# Patient Record
Sex: Female | Born: 1935 | Race: White | Hispanic: No | Marital: Married | Smoking: Former smoker
Health system: Southern US, Community
[De-identification: ages and names within clinical notes are randomized; demographics above are authoritative.]

## PROBLEM LIST (undated history)

## (undated) DIAGNOSIS — E785 Hyperlipidemia, unspecified: Secondary | ICD-10-CM

## (undated) DIAGNOSIS — M545 Low back pain, unspecified: Secondary | ICD-10-CM

## (undated) DIAGNOSIS — R0600 Dyspnea, unspecified: Secondary | ICD-10-CM

## (undated) DIAGNOSIS — R0609 Other forms of dyspnea: Secondary | ICD-10-CM

## (undated) DIAGNOSIS — I509 Heart failure, unspecified: Secondary | ICD-10-CM

## (undated) DIAGNOSIS — Z950 Presence of cardiac pacemaker: Secondary | ICD-10-CM

## (undated) DIAGNOSIS — M542 Cervicalgia: Secondary | ICD-10-CM

## (undated) DIAGNOSIS — E669 Obesity, unspecified: Secondary | ICD-10-CM

## (undated) DIAGNOSIS — F32A Depression, unspecified: Secondary | ICD-10-CM

## (undated) DIAGNOSIS — E876 Hypokalemia: Secondary | ICD-10-CM

## (undated) DIAGNOSIS — I495 Sick sinus syndrome: Secondary | ICD-10-CM

## (undated) DIAGNOSIS — Z86718 Personal history of other venous thrombosis and embolism: Secondary | ICD-10-CM

## (undated) DIAGNOSIS — I10 Essential (primary) hypertension: Secondary | ICD-10-CM

## (undated) DIAGNOSIS — F419 Anxiety disorder, unspecified: Secondary | ICD-10-CM

## (undated) DIAGNOSIS — N200 Calculus of kidney: Secondary | ICD-10-CM

## (undated) DIAGNOSIS — E039 Hypothyroidism, unspecified: Secondary | ICD-10-CM

## (undated) DIAGNOSIS — Z973 Presence of spectacles and contact lenses: Secondary | ICD-10-CM

## (undated) DIAGNOSIS — Z972 Presence of dental prosthetic device (complete) (partial): Secondary | ICD-10-CM

## (undated) DIAGNOSIS — E119 Type 2 diabetes mellitus without complications: Secondary | ICD-10-CM

## (undated) DIAGNOSIS — J449 Chronic obstructive pulmonary disease, unspecified: Secondary | ICD-10-CM

## (undated) DIAGNOSIS — K219 Gastro-esophageal reflux disease without esophagitis: Secondary | ICD-10-CM

## (undated) DIAGNOSIS — G4733 Obstructive sleep apnea (adult) (pediatric): Secondary | ICD-10-CM

## (undated) DIAGNOSIS — F329 Major depressive disorder, single episode, unspecified: Secondary | ICD-10-CM

## (undated) DIAGNOSIS — Z8739 Personal history of other diseases of the musculoskeletal system and connective tissue: Secondary | ICD-10-CM

## (undated) DIAGNOSIS — J189 Pneumonia, unspecified organism: Secondary | ICD-10-CM

## (undated) DIAGNOSIS — K5732 Diverticulitis of large intestine without perforation or abscess without bleeding: Secondary | ICD-10-CM

## (undated) DIAGNOSIS — G8929 Other chronic pain: Secondary | ICD-10-CM

## (undated) DIAGNOSIS — M1990 Unspecified osteoarthritis, unspecified site: Secondary | ICD-10-CM

## (undated) DIAGNOSIS — J90 Pleural effusion, not elsewhere classified: Secondary | ICD-10-CM

## (undated) DIAGNOSIS — I480 Paroxysmal atrial fibrillation: Secondary | ICD-10-CM

## (undated) DIAGNOSIS — C50911 Malignant neoplasm of unspecified site of right female breast: Secondary | ICD-10-CM

## (undated) DIAGNOSIS — H269 Unspecified cataract: Secondary | ICD-10-CM

## (undated) DIAGNOSIS — K08109 Complete loss of teeth, unspecified cause, unspecified class: Secondary | ICD-10-CM

## (undated) HISTORY — DX: Other forms of dyspnea: R06.09

## (undated) HISTORY — DX: Obstructive sleep apnea (adult) (pediatric): G47.33

## (undated) HISTORY — PX: VAGINAL HYSTERECTOMY: SUR661

## (undated) HISTORY — DX: Heart failure, unspecified: I50.9

## (undated) HISTORY — DX: Dyspnea, unspecified: R06.00

## (undated) HISTORY — DX: Essential (primary) hypertension: I10

## (undated) HISTORY — DX: Hyperlipidemia, unspecified: E78.5

## (undated) HISTORY — PX: POSTERIOR LAMINECTOMY / DECOMPRESSION LUMBAR SPINE: SUR740

## (undated) HISTORY — DX: Diverticulitis of large intestine without perforation or abscess without bleeding: K57.32

## (undated) HISTORY — PX: COLONOSCOPY: SHX174

## (undated) HISTORY — PX: BACK SURGERY: SHX140

## (undated) HISTORY — PX: DILATION AND CURETTAGE OF UTERUS: SHX78

## (undated) HISTORY — PX: LAPAROSCOPIC CHOLECYSTECTOMY: SUR755

## (undated) HISTORY — DX: Chronic obstructive pulmonary disease, unspecified: J44.9

## (undated) HISTORY — DX: Obesity, unspecified: E66.9

## (undated) HISTORY — PX: BREAST BIOPSY: SHX20

## (undated) HISTORY — DX: Anxiety disorder, unspecified: F41.9

## (undated) HISTORY — PX: ESOPHAGOGASTRODUODENOSCOPY: SHX1529

## (undated) HISTORY — DX: Paroxysmal atrial fibrillation: I48.0

---

## 1990-12-10 HISTORY — PX: LAMINOTOMY / EXCISION DISK POSTERIOR CERVICAL SPINE: SUR749

## 2003-12-11 HISTORY — PX: CARDIAC CATHETERIZATION: SHX172

## 2004-02-21 ENCOUNTER — Inpatient Hospital Stay (HOSPITAL_COMMUNITY): Admission: AD | Admit: 2004-02-21 | Discharge: 2004-02-23 | Payer: Self-pay | Admitting: Cardiology

## 2005-04-11 ENCOUNTER — Ambulatory Visit (HOSPITAL_BASED_OUTPATIENT_CLINIC_OR_DEPARTMENT_OTHER): Admission: RE | Admit: 2005-04-11 | Discharge: 2005-04-11 | Payer: Self-pay | Admitting: *Deleted

## 2005-04-15 ENCOUNTER — Ambulatory Visit: Payer: Self-pay | Admitting: Internal Medicine

## 2005-06-18 ENCOUNTER — Other Ambulatory Visit: Admission: RE | Admit: 2005-06-18 | Discharge: 2005-06-18 | Payer: Self-pay | Admitting: Obstetrics and Gynecology

## 2005-10-01 ENCOUNTER — Ambulatory Visit: Payer: Self-pay | Admitting: Internal Medicine

## 2005-10-10 ENCOUNTER — Ambulatory Visit (HOSPITAL_COMMUNITY): Admission: RE | Admit: 2005-10-10 | Discharge: 2005-10-10 | Payer: Self-pay | Admitting: Internal Medicine

## 2005-10-10 ENCOUNTER — Ambulatory Visit: Payer: Self-pay | Admitting: Internal Medicine

## 2005-10-10 ENCOUNTER — Ambulatory Visit: Admission: RE | Admit: 2005-10-10 | Discharge: 2005-10-10 | Payer: Self-pay | Admitting: Internal Medicine

## 2005-11-15 ENCOUNTER — Ambulatory Visit: Payer: Self-pay | Admitting: Internal Medicine

## 2005-12-10 HISTORY — PX: INSERT / REPLACE / REMOVE PACEMAKER: SUR710

## 2006-04-01 ENCOUNTER — Ambulatory Visit: Payer: Self-pay | Admitting: Internal Medicine

## 2006-08-07 ENCOUNTER — Ambulatory Visit: Payer: Self-pay | Admitting: Cardiology

## 2006-08-07 ENCOUNTER — Inpatient Hospital Stay (HOSPITAL_COMMUNITY): Admission: AD | Admit: 2006-08-07 | Discharge: 2006-08-09 | Payer: Self-pay | Admitting: Internal Medicine

## 2006-08-26 ENCOUNTER — Ambulatory Visit: Payer: Self-pay

## 2006-11-26 ENCOUNTER — Ambulatory Visit: Payer: Self-pay | Admitting: Internal Medicine

## 2007-04-17 ENCOUNTER — Ambulatory Visit: Payer: Self-pay | Admitting: Internal Medicine

## 2007-05-07 ENCOUNTER — Ambulatory Visit (HOSPITAL_COMMUNITY): Admission: RE | Admit: 2007-05-07 | Discharge: 2007-05-08 | Payer: Self-pay | Admitting: Otolaryngology

## 2007-07-14 ENCOUNTER — Ambulatory Visit: Payer: Self-pay

## 2007-10-27 ENCOUNTER — Ambulatory Visit: Payer: Self-pay | Admitting: Internal Medicine

## 2008-01-21 ENCOUNTER — Ambulatory Visit: Payer: Self-pay | Admitting: Internal Medicine

## 2008-04-21 ENCOUNTER — Ambulatory Visit: Payer: Self-pay | Admitting: Internal Medicine

## 2008-07-12 ENCOUNTER — Ambulatory Visit: Payer: Self-pay | Admitting: Internal Medicine

## 2008-10-13 ENCOUNTER — Ambulatory Visit: Payer: Self-pay | Admitting: Internal Medicine

## 2009-01-12 ENCOUNTER — Ambulatory Visit: Payer: Self-pay | Admitting: Internal Medicine

## 2009-03-18 ENCOUNTER — Encounter: Payer: Self-pay | Admitting: Internal Medicine

## 2009-04-13 ENCOUNTER — Ambulatory Visit: Payer: Self-pay | Admitting: Internal Medicine

## 2009-05-06 ENCOUNTER — Encounter: Payer: Self-pay | Admitting: Internal Medicine

## 2009-05-12 ENCOUNTER — Encounter (INDEPENDENT_AMBULATORY_CARE_PROVIDER_SITE_OTHER): Payer: Self-pay | Admitting: *Deleted

## 2009-08-13 DIAGNOSIS — R0609 Other forms of dyspnea: Secondary | ICD-10-CM

## 2009-08-13 DIAGNOSIS — G4733 Obstructive sleep apnea (adult) (pediatric): Secondary | ICD-10-CM | POA: Insufficient documentation

## 2009-08-13 DIAGNOSIS — J449 Chronic obstructive pulmonary disease, unspecified: Secondary | ICD-10-CM | POA: Insufficient documentation

## 2009-08-13 DIAGNOSIS — F411 Generalized anxiety disorder: Secondary | ICD-10-CM | POA: Insufficient documentation

## 2009-08-13 DIAGNOSIS — E669 Obesity, unspecified: Secondary | ICD-10-CM | POA: Insufficient documentation

## 2009-08-13 DIAGNOSIS — E1129 Type 2 diabetes mellitus with other diabetic kidney complication: Secondary | ICD-10-CM | POA: Insufficient documentation

## 2009-08-13 DIAGNOSIS — I10 Essential (primary) hypertension: Secondary | ICD-10-CM | POA: Insufficient documentation

## 2009-08-13 DIAGNOSIS — K5730 Diverticulosis of large intestine without perforation or abscess without bleeding: Secondary | ICD-10-CM | POA: Insufficient documentation

## 2009-08-13 DIAGNOSIS — E785 Hyperlipidemia, unspecified: Secondary | ICD-10-CM | POA: Insufficient documentation

## 2009-08-13 DIAGNOSIS — R0989 Other specified symptoms and signs involving the circulatory and respiratory systems: Secondary | ICD-10-CM

## 2009-08-13 DIAGNOSIS — I509 Heart failure, unspecified: Secondary | ICD-10-CM | POA: Insufficient documentation

## 2009-08-13 DIAGNOSIS — I4891 Unspecified atrial fibrillation: Secondary | ICD-10-CM | POA: Insufficient documentation

## 2009-08-23 ENCOUNTER — Ambulatory Visit: Payer: Self-pay | Admitting: Internal Medicine

## 2009-08-23 DIAGNOSIS — Z950 Presence of cardiac pacemaker: Secondary | ICD-10-CM

## 2009-11-24 ENCOUNTER — Encounter: Payer: Self-pay | Admitting: Internal Medicine

## 2009-11-29 ENCOUNTER — Encounter: Payer: Self-pay | Admitting: Internal Medicine

## 2009-11-29 ENCOUNTER — Ambulatory Visit: Payer: Self-pay | Admitting: Internal Medicine

## 2009-12-13 ENCOUNTER — Encounter: Payer: Self-pay | Admitting: Internal Medicine

## 2010-03-01 ENCOUNTER — Ambulatory Visit: Payer: Self-pay | Admitting: Internal Medicine

## 2010-03-15 ENCOUNTER — Encounter: Payer: Self-pay | Admitting: Internal Medicine

## 2010-06-07 ENCOUNTER — Ambulatory Visit: Payer: Self-pay | Admitting: Internal Medicine

## 2010-06-20 ENCOUNTER — Encounter: Payer: Self-pay | Admitting: Internal Medicine

## 2010-09-05 ENCOUNTER — Ambulatory Visit: Payer: Self-pay | Admitting: Internal Medicine

## 2010-12-07 ENCOUNTER — Ambulatory Visit: Payer: Self-pay | Admitting: Internal Medicine

## 2010-12-20 ENCOUNTER — Encounter (INDEPENDENT_AMBULATORY_CARE_PROVIDER_SITE_OTHER): Payer: Self-pay | Admitting: *Deleted

## 2011-01-11 NOTE — Cardiovascular Report (Signed)
Summary: Office Visit Remote   Office Visit Remote   Imported By: Roderic Ovens 12/15/2009 12:47:21  _____________________________________________________________________  External Attachment:    Type:   Image     Comment:   External Document

## 2011-01-11 NOTE — Assessment & Plan Note (Signed)
Summary: medtronic/saf   Visit Type:  Follow-up Primary Traci [name]:  Traci [name]   History of Present Illness: Ms. Traci [name] returns today for PPM followup.  She is a pleasant 77 yo woman with a h/o bradycardia, PAF now controlled with amiodarone. She is s/p PPM and doing well.  No syncope.  No c/p, sob, fever, or chills.  No other complaints.  Current Medications (verified): 1)  Nexium 40 Mg Cpdr (Esomeprazole Magnesium) .Marland Kitchen.. 1 By Mouth Once Daily 2)  Potassium Chloride Crys Cr 20 Meq Cr-Tabs (Potassium Chloride Crys Cr) .... 2 in Am 1 in Pm 3)  Furosemide 40 Mg Tabs (Furosemide) .... Take One Tablet By Mouth Daily. 4)  Diltiazem Hcl Er Beads 180 Mg Xr24h-Cap (Diltiazem Hcl Er Beads) .... Take One Capsule By Mouth Daily 5)  Amiodarone Hcl 200 Mg Tabs (Amiodarone Hcl) .... Take One Tablet By Mouth Daily 6)  Metoprolol Succinate 100 Mg Xr24h-Tab (Metoprolol Succinate) .... Take One Tablet By Mouth Daily 7)  Warfarin Sodium 5 Mg Tabs (Warfarin Sodium) .... Use As Directed By Anticoagulation Clinic 8)  Zaroxolyn 2.5 Mg Tabs (Metolazone) .Marland Kitchen.. 1 By Mouth Once Daily 9)  Zyrtec Hives Relief 10 Mg Tabs (Cetirizine Hcl) .Marland Kitchen.. 1 By Mouth Once Daily 10)  Doxazosin Mesylate 2 Mg Tabs (Doxazosin Mesylate) .Marland Kitchen.. 1 By Mouth Once Daily 11)  Ra Biotin 2500 Mcg Caps (Biotin) .... Uad  Allergies: 1)  ! Codeine 2)  ! Penicillin 3)  ! Sulfa 4)  ! * Shellfish  Past History:  Past Medical History: Last updated: 08/13/2009 DYSPNEA ON EXERTION (ICD-786.09) COPD (ICD-496) COUMADIN THERAPY (ICD-V58.61) ANXIETY (ICD-300.00) CHF (ICD-428.0) DIVERTICULOSIS OF COLON (ICD-562.10) SLEEP APNEA, OBSTRUCTIVE (ICD-327.23) PAROXYSMAL ATRIAL FIBRILLATION (ICD-427.31) HYPERTENSION (ICD-401.9) OBESITY (ICD-278.00) HYPERLIPIDEMIA (ICD-272.4) DM (ICD-250.00)  Past Surgical History: Last updated: 08/13/2009  1. Status post cardiac catheterization.  2. Hysterectomy.  3. C-spine surgery.  4. Lower back surgery.  5.  EGD.  Vital Signs:  Patient profile:   77 year old female Height:      65.75 inches Weight:      260 pounds BMI:     42.44 Pulse rate:   69 / minute BP sitting:   100 / 54  (left arm)  Vitals Entered By: Laurance Flatten CMA (September 05, 2010 3:57 PM)  Physical Exam  General:  Well developed, well nourished, in no acute distress. Head:  normocephalic and atraumatic Eyes:  PERRLA/EOM intact; conjunctiva and lids normal. Neck:  Neck supple, no JVD. No masses, thyromegaly or abnormal cervical nodes. Chest Wall:  Well healed PPM incision. Lungs:  Clear bilaterally to auscultation with no wheezes, rales, or rhonchi. Heart:  RRR with normal S1 and S2.  PMI is not enlarged or laterally displaced. Abdomen:  Bowel sounds positive; abdomen soft and non-tender without masses, organomegaly, or hernias noted. No hepatosplenomegaly. Msk:  Back normal, normal gait. Muscle strength and tone normal. Pulses:  pulses normal in all 4 extremities Extremities:  No clubbing or cyanosis. Neurologic:  Alert and oriented x 3.   PPM Specifications Following MD:  Lewayne Bunting, MD     PPM Vendor:  Medtronic     PPM Model Number:  ADDR01     PPM Serial Number:  [id number] PPM DOI:  08/08/2006     PPM Implanting MD:  Lewayne Bunting, MD  Lead 1    Location: RA     DOI: 08/08/2006     Model #: 4098     Serial #: [id number]     Status:  active Lead 2    Location: RV     DOI: 08/08/2006     Model #: 1610     Serial #: [id number]     Status: active   Indications:  BRADYCARDIA    PPM Follow Up Battery Voltage:  2.78 V     Battery Est. Longevity:  6.5 yrs     Pacer Dependent:  No       PPM Device Measurements Atrium  Amplitude: 2.80 mV, Impedance: 565 ohms, Threshold: 0.750 V at 0.40 msec Right Ventricle  Amplitude: 15.68 mV, Impedance: 700 ohms, Threshold: 1.00 V at 0.40 msec  Episodes MS Episodes:  185     Percent Mode Switch:  0.4%     Coumadin:  Yes Ventricular High Rate:  0     Atrial Pacing:  99.6%      Ventricular Pacing:  0.2%  Parameters Mode:  MVP     Lower Rate Limit:  60     Upper Rate Limit:  130 Paced AV Delay:  250     Sensed AV Delay:  250 Next Remote Date:  12/07/2010     Next Cardiology Appt Due:  08/13/2011 Tech Comments:  185 MODE SWITCHES + COUMADIN.  NORMAL DEVICE FUNCTION.  CHANGED RA OUTPUT FROM 1.5 TO 2.0 V AND RV OUTPUT FROM 2.0 TO 2.5 V.  [employer] TRANSMISSION 12-07-10. ROV IN 12 MTHS W/GT. Traci [name]  September 05, 2010 4:11 PM MD Comments:  Agree with above.  Impression & Recommendations:  Problem # 1:  CARDIAC PACEMAKER IN SITU (ICD-V45.01) Her device is working normally.  Will recheck in several months.  Problem # 2:  PAROXYSMAL ATRIAL FIBRILLATION (ICD-427.31) She is in NSR over 99% of the time.  She will continue meds as below. Her updated medication list for this problem includes:    Amiodarone Hcl 200 Mg Tabs (Amiodarone hcl) .Marland Kitchen... Take one tablet by mouth daily    Metoprolol Succinate 100 Mg Xr24h-tab (Metoprolol succinate) .Marland Kitchen... Take one tablet by mouth daily    Warfarin Sodium 5 Mg Tabs (Warfarin sodium) ..... Use as directed by anticoagulation clinic  Problem # 3:  HYPERTENSION (ICD-401.9) Her blood pressure is well controlled.  Continue meds as below. Her updated medication list for this problem includes:    Furosemide 40 Mg Tabs (Furosemide) .Marland Kitchen... Take one tablet by mouth daily.    Diltiazem Hcl Er Beads 180 Mg Xr24h-cap (Diltiazem hcl er beads) .Marland Kitchen... Take one capsule by mouth daily    Metoprolol Succinate 100 Mg Xr24h-tab (Metoprolol succinate) .Marland Kitchen... Take one tablet by mouth daily    Zaroxolyn 2.5 Mg Tabs (Metolazone) .Marland Kitchen... 1 by mouth once daily    Doxazosin Mesylate 2 Mg Tabs (Doxazosin mesylate) .Marland Kitchen... 1 by mouth once daily  Patient Instructions: 1)  Your physician wants you to follow-up in:12 months with Dr Court Joy will receive a reminder letter in the mail two months in advance. If you don't receive a letter, please call our office to  schedule the follow-up appointment. 2)  Try and have your weight at or below 250lbs

## 2011-01-11 NOTE — Letter (Signed)
Summary: Remote Device Check  [facility], Main Office  [address]   [place], [place] [address]   Phone: [phone]  Fax: [phone]     December 13, 2009 MRN: [id number]   [facility] [address] [place], [place]  [address]   Dear Ms. KOLINSKI,   Your remote transmission was recieved and reviewed by your physician.  All diagnostics were within normal limits for you.  __X___Your next transmission is scheduled for:    March 01, 2010.  Please transmit at any time this day.  If you have a wireless device your transmission will be sent automatically.     Sincerely,  Proofreader

## 2011-01-11 NOTE — Letter (Signed)
Summary: Remote Device Check  [facility], Main Office  [address]   [place], [place] [address]   Phone: [phone]  Fax: [phone]     June 20, 2010 MRN: [id number]   [facility] [address] [place], [place]  [address]   Dear Ms. RATHBUN,   Your remote transmission was recieved and reviewed by your physician.  All diagnostics were within normal limits for you.   __X____Your next office visit is scheduled for:    September 2011 with Dr Ladona Ridgel.                          Please call our office to schedule an appointment.    Sincerely,  Vella Kohler

## 2011-01-11 NOTE — Letter (Signed)
Summary: Remote Device Check  [facility], Main Office  [address]   [place], [place] [address]   Phone: [phone]  Fax: [phone]     December 20, 2010 MRN: [id number]   [facility] [address] [place], [place]  [address]   Dear Ms. WITHEM,   Your remote transmission was recieved and reviewed by your physician.  All diagnostics were within normal limits for you.  __X___Your next transmission is scheduled for:  03-08-2011.  Please transmit at any time this day.  If you have a wireless device your transmission will be sent automatically.   Sincerely,  Vella Kohler

## 2011-01-11 NOTE — Cardiovascular Report (Signed)
Summary: Office Visit Remote   Office Visit Remote   Imported By: Roderic Ovens 06/21/2010 12:08:06  _____________________________________________________________________  External Attachment:    Type:   Image     Comment:   External Document

## 2011-01-11 NOTE — Cardiovascular Report (Signed)
Summary: Office Visit   Office Visit   Imported By: Roderic Ovens 09/06/2010 15:46:37  _____________________________________________________________________  External Attachment:    Type:   Image     Comment:   External Document

## 2011-01-11 NOTE — Letter (Signed)
Summary: Remote Device Check  [facility], Main Office  [address]   [place], [place] [address]   Phone: [phone]  Fax: [phone]     March 15, 2010 MRN: [id number]   [facility] [address] [place], [place]  [address]   Dear Ms. FAIRES,   Your remote transmission was recieved and reviewed by your physician.  All diagnostics were within normal limits for you.  __X___Your next transmission is scheduled for:  June 07, 2010.  Please transmit at any time this day.  If you have a wireless device your transmission will be sent automatically.     Sincerely,  Proofreader

## 2011-01-12 NOTE — Cardiovascular Report (Signed)
Summary: Office Visit Remote   Office Visit Remote   Imported By: Roderic Ovens 03/17/2010 14:59:59  _____________________________________________________________________  External Attachment:    Type:   Image     Comment:   External Document

## 2011-01-17 ENCOUNTER — Other Ambulatory Visit: Payer: Self-pay

## 2011-01-17 DIAGNOSIS — C50919 Malignant neoplasm of unspecified site of unspecified female breast: Secondary | ICD-10-CM

## 2011-01-25 ENCOUNTER — Encounter (HOSPITAL_COMMUNITY): Payer: Self-pay

## 2011-01-25 ENCOUNTER — Other Ambulatory Visit (HOSPITAL_COMMUNITY): Payer: Self-pay

## 2011-01-25 ENCOUNTER — Ambulatory Visit (HOSPITAL_COMMUNITY): Payer: Self-pay

## 2011-03-08 ENCOUNTER — Ambulatory Visit (INDEPENDENT_AMBULATORY_CARE_PROVIDER_SITE_OTHER): Payer: Medicare Other | Admitting: *Deleted

## 2011-03-08 DIAGNOSIS — I4891 Unspecified atrial fibrillation: Secondary | ICD-10-CM

## 2011-03-08 NOTE — Progress Notes (Signed)
Remote pacer check  

## 2011-04-10 ENCOUNTER — Encounter: Payer: Self-pay | Admitting: *Deleted

## 2011-04-24 NOTE — Op Note (Signed)
NAME:  Traci [name], Traci [name]                 ACCOUNT NO.:  [id number]   MEDICAL RECORD NO.:  [id number]          PATIENT TYPE:  OIB   LOCATION:  5729                         FACILITY:  [facility]   PHYSICIAN:  Jefry H. Pollyann Kennedy, MD     DATE OF BIRTH:  1932-01-30   DATE OF PROCEDURE:  05/07/2007  DATE OF DISCHARGE:                               OPERATIVE REPORT   PREOPERATIVE DIAGNOSES:  Obstructive sleep apnea syndrome; nasal septal  deviation, traumatic; and inferior turbinate hypertrophy.   POSTOPERATIVE DIAGNOSES:  Obstructive sleep apnea syndrome; nasal septal  deviation, traumatic; and inferior turbinate hypertrophy.   PROCEDURE PERFORMED:  1. Nasal septoplasty.  2. Submucous resection, inferior turbinates.   SURGEON:  Jefry H. Pollyann Kennedy, MD.   ANESTHESIA:  General endotracheal anesthesia was used.   COMPLICATIONS:  No complications.   ESTIMATED BLOOD LOSS:  Minimal.   FINDINGS:  Severe deviation of the septum with a deep extensive caudal  deflection to the right anteriorly and a leftward deflection  posteriorly.  Inferior turbinates were very large with thick bone.   HISTORY:  This is a 77 year old lady who fell several months ago and  broke her nose, did not seek medical treatment at the time, has had  difficulty using her CPAP and breathing through her nose ever since.  The risks, benefits, alternatives and complications of the procedure  were explained to the patient who seemed to understand and agreed to  surgery.   DESCRIPTION OF PROCEDURE:  The patient was taken to the operating room  and placed on the operating table in the supine position.  Following the  induction of general endotracheal anesthesia, the patient was prepped  and draped in the standard fashion.  Afrin spray was used preoperatively  in the nose.  1% Xylocaine with epinephrine was infiltrated into the  septum, the columella and inferior turbinates bilaterally.  Afrin-soaked  pledgets were placed bilaterally.   1.  NASAL SEPTOPLASTY:  A left hemitransfixion incision was used to      approach the cartilage and a mucosal flap was developed posteriorly      to the sphenoid rostrum.  The bony cartilaginous junction was      divided and a flap was developed down the right side.  There was a      vertical fracture through the posterior quadrangular cartilage that      was identified with complete separation of the cartilaginous      fragments.  The mucosa was kept intact despite this.  The ethmoid      plate was very thickened and S-shaped with curvature to the left.      A resection of the ethmoid plate was then accomplished using Jolyne Loa.  The quadrangular cartilage was dissected out      of the maxillary crest and the columellar pocket.  2-3 mm of      inferior cartilage was resected with a swivel knife.  The fracture      segment posteriorly was removed as well.  A harvested piece of  cartilage was then cut to size and shape and used as an extra shim      inferiorly to help support the residual nasal dorsum of the      residual caudal strut.  This was secured in place using 4-0 undyed      nylon suture.  The septal incision was reapproximated with chromic      suture.  The septal flaps were quilted with plain gut.   1. SUBMUCOUS RESECTION OF INFERIOR TURBINATES BILATERALLY:  The      leading edge of the inferior turbinates was incised in a vertical      fashion with a #15 scalpel.  A caudal elevator was used to elevate      mucosa off bone in all directions, and large fragments of bone were      resected.  Turbinate remnants were then outfractured with the      caudal elevator.  The nasal cavities were suctioned of blood and      secretions, packed with rolled up Telfa coated with bacitracin.      The pharynx was then suctioned as well.  The patient was then      awakened, extubated and transferred to recovery in stable      condition.      Jefry H. Pollyann Kennedy, MD   Electronically Signed     JHR/MEDQ  D:  05/07/2007  T:  05/08/2007  Job:  [id number]   cc:   Tommas Olp, M.D.

## 2011-04-24 NOTE — Assessment & Plan Note (Signed)
[facility]                         ELECTROPHYSIOLOGY OFFICE NOTE   NAME:[name], Traci [name]                        MRN:          [id number]  DATE:07/14/2007                            DOB:          12/20/1931    Traci [name] was seen today in the clinic on July 14, 2007, for follow-  up of her Medtronic model number [id number], date of implant was August 08, 2006, for bradycardia.   On interrogation of her device today, her battery voltage is 2.78, P-  waves measured 1.4 to 2 mV with an atrial capture threshold of 0.75  volts at 0.4 msec and an atrial lead impedance of 532 ohms.  R-waves  measured 11.2 to 15.68 mV with a ventricular capture threshold of 0.75  volts at 0.4 msec and a ventricular lead impedance of 706 ohms.  There  were no episodes noted.  No mode switches.  She is on Coumadin therapy.  She V paces about 0.2% of the time.  No changes were made in her  parameters.  Her capture adaptive both the A and D are on and she will  send [facility] transmissions with a return office visit in one year.      Altha Harm, LPN  Electronically Signed      Doylene Canning. Ladona Ridgel, MD  Electronically Signed   PO/MedQ  DD: 07/14/2007  DT: 07/14/2007  Job #: [id number]

## 2011-04-24 NOTE — Assessment & Plan Note (Signed)
[facility]                         ELECTROPHYSIOLOGY OFFICE NOTE   NAME:COOPERChong, January                        MRN:          [id number]  DATE:04/17/2007                            DOB:          January 26, 1932    Ms. Bezold returns today for followup and evaluation for preoperative  clearance for sinus surgery.  The patient is a very pleasant 77 year old  woman with a history of obesity, paroxysmal atrial fibrillation, chronic  Coumadin therapy, with hypertension, diabetes, and sleep apnea.  She  fractured her nose back in October after falling on a treadmill and is  having trouble with nasal obstruction, is now scheduled for nasal  surgery for a deviated nasal septum.  She denies much in the way of  shortness of breath.  She has gone back to exercising and is now working  on [employer].  She denies chest pain, she denies shortness of  breath.  She is fairly sedentary but does get around on a regular basis.   PHYSICAL EXAMINATION:  GENERAL:  Notable for her being a pleasant, obese  woman in no distress.  VITAL SIGNS:  Blood pressure 122/62, the pulse 68 and regular, the  respirations were 18, and the weight was 253 pounds.  NECK:  Revealed no jugular venous distention.  LUNGS:  Clear bilaterally to auscultation.  There were no wheezes,  rales, or rhonchi.  CARDIOVASCULAR:  Distant with a regular rate and rhythm and normal S1  and S2.  EXTREMITIES:  Demonstrated trace peripheral edema.   MEDICATIONS:  Include diltiazem, Coumadin, Pacerone, furosemide,  Flonase, potassium, metoprolol, Nexium, metolazone, Zyrtec, Cardura.  The EKG today demonstrates atrial pacing with appropriate ventricular  sensing.   IMPRESSION:  1. Symptomatic bradycardia.  2. Paroxysmal atrial fibrillation.  3. Congestive heart failure secondary to diastolic dysfunction, now      well controlled.   DISCUSSION:  Overall Ms. Gulas is stable.  I think she will be low risk  from major cardiovascular complications from sinus surgery.  I would  recommend that she stop her Coumadin 4 days prior to the surgery and  start it back on the day of surgery after her surgical procedure has  been carried out.  I do not think Lovenox overlap will be required for  this patient.  I will plan to see her back on an as-needed basis and per  our usual followup.     Doylene Canning. Ladona Ridgel, MD  Electronically Signed    GWT/MedQ  DD: 04/17/2007  DT: 04/17/2007  Job #: [id number]   cc:   Jeannett Senior. Pollyann Kennedy, MD

## 2011-04-27 NOTE — Discharge Summary (Signed)
NAME:  Traci [name], BRINSON NO.:  [id number]   MEDICAL RECORD NO.:  [id number]                   PATIENT TYPE:  INP   LOCATION:  2015                                 FACILITY:  [facility]   PHYSICIAN:  Arturo Morton. Riley Kill, M.D. [facility]         DATE OF BIRTH:  12/06/1932   DATE OF ADMISSION:  02/21/2004  DATE OF DISCHARGE:  02/23/2004                           DISCHARGE SUMMARY - REFERRING   HOSPITAL COURSE:  Ms. Niziolek is a 77 year old female who was transferred  from [facility] after experiencing indigestion type symptoms that  awakened her around 2 a.m. on Sunday morning. EMS was called, and she was  given sublingual nitroglycerin that helped this pain. She was also found to  be in atrial fibrillation with rapid ventricular response. She is on  Coumadin for PAF. She was then taken to [facility] and given 5 mg of  IV Lopressor and returned to normal sinus rhythm. She does recall  indigestion symptoms in the past that was partially relieved by Tums, but  she felt the pain was different.   An echocardiogram in the past revealed LVH as well as diastolic dysfunction.  She has recently had stress with the death of her mother in law. She was  transferred here for cardiac catheterization.   PAST MEDICAL HISTORY:  Significant for PAF, long term Coumadin use, LVH,  diabetes mellitus diet controlled, allergies, hypertension, status post  hysterectomy secondary to cervical cancer, history of back surgery-lumbar  and cervical.   SOCIAL HISTORY:  Lives in [place] with her husband. She is married. She  has two daughters. Quit smoking five years ago but maintains a diabetic  diet. No alcohol or illicit drug use.   ALLERGIES:  PENICILLIN, SULFA, CODEINE, SHELLFISH.   MEDICATIONS PRIOR TO ADMISSION:  1. Cardizem LA 240 mg a day.  2. Uroxatral 10 mg a day.  3. Coumadin 5 mg a day which is being held.  4. Hydrochlorothiazide 25 mg a day.  5. Rhinocort as  needed.   HOSPITAL COURSE:  The patient was brought into [facility] and the following  day underwent cardiac catheterization that revealed essentially  angiographically normal coronary arteries. EF was greater than 60%. At this  time, it was felt that her symptoms were secondary to her atrial  fibrillation. We will go ahead and resume Coumadin and continue to monitor  her rhythm. Overnight, she was monitored and was felt to be in stable  condition for discharge on February 23, 2004.   DISCHARGE INSTRUCTIONS:  Her medications are as prior to her admission  medications with the addition of Lopressor 25 mg p.o. b.i.d. No strenuous  activity or driving for two days and gradually increase activity. Remain on  a low fat, diabetic diet. Clean over catheterization site with soap and  water. No scrubbing. She is to call for any questions or concerns. To follow  with DR. Wallace Cullens in two  weeks.      Guy Franco, P.A. [facility]                      Thomas D. Riley Kill, M.D. [facility]    LB/MEDQ  D:  02/23/2004  T:  02/24/2004  Job:  [id number]   cc:   Wallace Cullens  [address] [place]  [place]  [place] [address]  Fax: [phone]

## 2011-04-27 NOTE — Cardiovascular Report (Signed)
NAME:  Traci [name], POZNANSKI NO.:  [id number]   MEDICAL RECORD NO.:  [id number]                   PATIENT TYPE:  INP   LOCATION:  2015                                 FACILITY:  [facility]   PHYSICIAN:  Arturo Morton. Riley Kill, M.D. [facility]         DATE OF BIRTH:  02/11/1932   DATE OF PROCEDURE:  02/22/2004  DATE OF DISCHARGE:                              CARDIAC CATHETERIZATION   PROCEDURES PERFORMED:  1. Left heart catheterization.  2. Selective coronary arteriography.  3. Selective left ventriculography.   CARDIOLOGIST:  Arturo Morton. Riley Kill, M.D.   INDICATIONS:  Ms. Keough is a delightful 77 year old woman without has been  under a lot of stress.  She recently awoke with the onset of chest pain.  She was brought to the emergency room where she was noted to be in rapid  atrial fibrillation.  She subsequently converted.  She was transferred  subsequently for cardiac catheterization and further evaluation.  Her  Coumadin was stopped and her INR was down to 1.6 earlier today.  She was  brought to the lab for further evaluation.   DESCRIPTION OF THE PROCEDURE:  The patient was brought to the cath lab, and  prepped and draped in the usual fashion.  Through an anterior puncture the  right femoral artery was easily entered.  A 6 French sheath was used. Views  of the left and right coronary arteries were obtained in multiple  angiographic projections.  Ventriculography was performed in the RAO  projection.   The patient tolerated the procedure well and there were no complications.  All catheters were subsequently removed and she was taken to the holding  area in satisfactory clinical condition.   HEMODYNAMIC DATA:  1. Central aortic pressure 153/72, mean 105.  2. Left ventricle 166/22.  3. No gradient on pullback across the aortic valve.   ANGIOGRAPHIC DATA:  1. Ventriculography:  Ventriculography was done in the RAO projection.  The     overall systolic function was  vigorous.  No segmental abnormalities or     contraction were identified.   1. Left Main Coronary Artery:  The left main coronary artery is free of     critical disease.   1. LAD:  The LAD course to the apex.  The LAD tapers after the origin of the     large diagonal branch, but overall appears to be without critical focal     narrowing.  The large diagonal itself is without significant narrowing.   1. Circumflex:  The circumflex provides a very large marginal branch, which     has several bifurcations and is without critical narrowing.   1. Right Coronary Artery:  The right coronary artery is a fairly good-sized     vessel that trifurcates distally into a PDA, posterolateral branch and a     posterolateral system.  Most of this is fairly small in caliber, but  moderate in distribution.   CONCLUSIONS:  1. Well-preserved left ventricular function.  2. No high-grade focal coronary obstruction.   DISPOSITION:  I suspect the symptoms largely were related to the rapid  atrial fibrillation.   At the present time she will be treated medically.  Follow up will be with  Drs. Hoffman and Zeringue.  She will be started back on her Coumadin  tonight.  Should she have recurrent episodes of atrial fibrillation then she  may require amiodarone for prevention of recurrence.                                               Arturo Morton. Riley Kill, M.D. [facility]    TDS/MEDQ  D:  02/22/2004  T:  02/24/2004  Job:  [id number]   cc:   Wallace Cullens  [address] [place].  [place]  [place] [address]  Fax: [phone]   Wannetta Sender.  [address]  [place]  [place] [address]  Fax: [phone]   [facility]

## 2011-04-27 NOTE — Consult Note (Signed)
NAMERUTHELL, FEIGENBAUM                 ACCOUNT NO.:  [id number]   MEDICAL RECORD NO.:  [id number]          PATIENT TYPE:  INP   LOCATION:  2008                         FACILITY:  [facility]   PHYSICIAN:  Doylene Canning. Ladona Ridgel, MD    DATE OF BIRTH:  [birth year]   DATE OF CONSULTATION:  08/08/2006  DATE OF DISCHARGE:                                   CONSULTATION   INDICATION FOR CONSULTATION:  Evaluation of symptomatic bradycardia.   HISTORY OF PRESENT ILLNESS:  The patient is a very pleasant, 77 year old  woman whom I have followed in the past.  She has a history of persistent  atrial fibrillation, hypertension, diabetes, dyslipidemia, obesity, and has  very symptomatic atrial fibrillation with rapid ventricular response.  The  patient has over the last week had increasing fatigue and weakness and  dyspnea and presented to the [facility] with symptomatic bradycardia,  specifically dizziness, chest pain, orthostasis and shortness of breath.  The patient was transferred here for additional evaluation.  She was found  to have heart rates in the high 20s, and her beta blocker, calcium channel  blocker and amiodarone have been held.   PAST MEDICAL HISTORY:  Of note, in the past, the patient has had very rapid  atrial fibrillation with very difficult to control ventricular rates.  Additional past medical history is notable for diabetes, obesity,  dyslipidemia and hypertension.  She is on chronic Coumadin therapy.  She has  a history of diverticulosis and anxiety problems.   SOCIAL HISTORY:  The patient is married.  She lives in [place].  She has  a history of tobacco use but stopped smoking in 1998.  She denies alcohol  abuse.   FAMILY HISTORY:  Is notable for her mother dying at age 59 of heart failure  symptoms, and her father dying at age 63 of cancer.   REVIEW OF SYSTEMS:  Is notable for night sweats, but no fevers or chills.  She denies headache, vision or hearing problems.  She does  have nasal  drainage at times.  She denies any skin rashes or lesions.  She does have  dyspnea and orthopnea, but denies chest pain or peripheral edema.  She has  rare palpitations.  She has pre-syncope.  She denies claudication, cough or  wheezing.  She denies frequency or urgency.  She has been treated recently  for a urinary tract infection with associated dysuria and hematuria.  She  denies weakness, numbness or depression.  She denies arthralgias or  arthritis.  She denies nausea, vomiting, diarrhea or constipation, but does  have occasional reflux symptoms.  She denies heat or cold intolerance or  recent skin changes.  The rest of her review of systems was negative.   PHYSICAL EXAMINATION:  GENERAL:  She is a pleasant, well-appearing, obese  woman in no distress.  VITAL SIGNS:  The blood pressure was 150/70, the pulse was 50 and regular,  respirations were 20, temperature was 98.  HEENT:  Normocephalic and atraumatic.  Pupils are equal and round.  The  oropharynx was moist.  The sclerae  were anicteric.  NECK:  The neck revealed no jugular venous distention.  There was no  thyromegaly.  Trachea was midline.  The carotids were 2+ and symmetric.  LUNGS:  The lungs were clear to bilaterally on auscultation.  No wheezes,  rales or rhonchi.  CARDIOVASCULAR:  Exam revealed a regular bradycardia with normal S1 and S2.  Her heart sounds were somewhat distant.  ABDOMEN:  The abdominal exam was soft, non-tender and nondistended.  There  was no organomegaly.  Bowel sounds were present.  There was no rebound or  guarding.  EXTREMITIES:  Demonstrated no cyanosis, clubbing or edema.  The pulses were  2+ and symmetric.   IMAGING:  The EKG demonstrates sinus bradycardia.  Prior ECGs demonstrate  junctional rhythm with heart rates of 30 beats per minute.   LABORATORY DATA:  Her INR was 2.1.   IMPRESSION:  1. Symptomatic bradycardia.  2. Paroxysmal atrial fibrillation.  3. Hypertension.  4.  Obesity.  5. Chronic Coumadin therapy.   DISCUSSION:  I discussed the treatment options with the patient.  Because  she has had very symptomatic atrial fibrillation in the past, which has been  nicely controlled with the combination of amiodarone, calcium channel  blockers and beta blockers, I have recommended that we proceed with  permanent pacemaker implantation for bradycardic support.  I have discussed  with her Primary Physician, Dr. Mikey Bussing, who is in agreement.  She will  continue on Coumadin.  Because she is on amiodarone and because she has had  some dyspnea, we will plan on checking pulmonary function tests.  Of note,  the patient did present with a brain natriuretic peptide that was markedly  elevated, suggestive of congestive heart failure.  However, I suspect this  is multifactorial.  I note her catheterization two years ago demonstrated  normal left ventricular function and normal coronaries.           ______________________________  Doylene Canning. Ladona Ridgel, MD     GWT/MEDQ  D:  08/08/2006  T:  08/08/2006  Job:  [id number]   cc:   Tarri Fuller, MD, [facility]

## 2011-04-27 NOTE — Discharge Summary (Signed)
NAMESAKARA, Traci [name]                 ACCOUNT NO.:  [id number]   MEDICAL RECORD NO.:  [id number]          PATIENT TYPE:  INP   LOCATION:  2008                         FACILITY:  [facility]   PHYSICIAN:  Doylene Canning. Ladona Ridgel, MD    DATE OF BIRTH:  Jan 24, 1932   DATE OF ADMISSION:  08/07/2006  DATE OF DISCHARGE:                                 DISCHARGE SUMMARY   SHE HAS MULTIPLE ALLERGIES INCLUDING NEXIUM, PENICILLIN, CODEINE, SHELLFISH,  SULFA AND FLOMAX.   PRINCIPAL DIAGNOSES:  1. Admitted with symptomatic bradycardia, which includes      dizziness/diaphoresis/dyspnea on exertion.      a.     Chronotropic incompetence.      b.     Beta blocker and calcium channel blocker on hold.  The patient       continues on amiodarone.  2. History of paroxysmal atrial fibrillation, now in sinus rhythm  3. Discharging day 1 status post implant of Medtronic Adapta dual-chamber      pacemaker, Dr. Ladona Ridgel.   SECONDARY DIAGNOSES:  1. Diabetes.  2. Dyslipidemia.  3. Obstructive sleep apnea.  4. Catheterization in 2005 shows normal coronary anatomy, ejection      fraction preserved.  5. Hypertension.  6. Diverticulosis.  7. Chronic obstructive pulmonary disease/exercise intolerance.  8. Obesity.  9. Congestive heart failure with diastolic dysfunction.   PROCEDURE:  August 08, 2006, implantation of Medtronic Adapta dual-chamber  pacemaker by Dr. Lewayne Bunting for symptomatic bradycardia.   BRIEF HISTORY:  Traci [name] a 77 year old female.  She has a history of  paroxysmal atrial fibrillation.  She is maintained well on amiodarone,  calcium channel blocker and beta blocker.   Patient is seeing her primary care physician, Dr. Mikey Bussing, because she has  been having hives, and, for this, her Nexium and Flomax have been  discontinued.  She has also had a urinary tract infections.  She was  diagnosed 2 days ago and started on antibiotics.  Actually, she is  continuing Cipro at this time.   The patient notes  some ongoing fatigue, but has gotten a little worse  lately, and she has increased dyspnea on exertion.  Day before this  admission, August 28th, she had orthostatic symptoms, which included the  dizziness, diaphoresis and presyncope.  She says that when she stood up she  would have to sit back down, otherwise she would fall.  Sometimes she would  get up and take a few steps before the symptoms occurred, but they were  consistent with position changes and/or ambulation.  She went to [facility] on the evening of August 28th; found to have junctional bradycardia  with heart rate in the 20s and 30s and was admitted over night.   Her Toprol, Cardizem and amiodarone were held at [facility].  She was seen by  her primary care physician who expressed a need to transfer to [facility] for a pacemaker implantation.  The patient has had general malaise,  dyspnea on exertion and some chest tightness.  Her heart rate off these  medications has climbed into  the 40s and 50s and the patient is transferring  to [facility].   Traci [name] has never had these kind of symptoms before.  She has been  attending cardiac rehabilitation on a regular basis and notes improvement in  her ability to walk on a treadmill since she started the program.  In the  program, she says her heart rates in the 50s when she starts and in the 60s  when she finishes, but she does not remember higher heart rates.  Since  converting from a junctional rhythm, which was present on her admission to  [facility] to a sinus bradycardia in the 40s and 50s, she feels much  better.   HOSPITAL COURSE:  The patient admitted to [facility] in transfer  from [facility] at the recommendation of her primary care giver, Dr.  Lindwood Qua.  She was found to have a history of exercise intolerance,  symptomatic bradycardia with rates in the 20s and 30s.  She was planned for  pacemaker implantation, and this  procedure was accomplished on August 30th.  A dual-chamber pacemaker was implanted.  The patient was ready for discharge  the next day.  Her incision is healing nicely without hematoma.  The device  has been interrogated; all values within normal limits, no changes were  made.  X-ray has been inspected and shows the leads in appropriate position.  The patient has no pneumothorax.  The patient will be replaced on her beta  blocker and her calcium channel blocker at preadmission doses, along with  her amiodarone for her paroxysms of atrial fibrillation.  She will also  maintain her Coumadin therapy.  All other medicines will continue as before  this admission.  She has been counseled to keep her arm quiet for the next 4  days and to keep her incision dry for the next 7 days.  She is not to drive  for 1 week, not to lift anything heavier than 10 pounds for the next 4  weeks.  Her discharge diet is a low-sodium, low-cholesterol diabetic diet.  As far as her exercise is concerned, she may walk right away in the mall or  at home.  She can regain the treadmill in 1 week and arm exercises started  in 4 weeks.  She has a followup at [facility], [address]; pacer clinic, Monday, September 17th at 9:20.  She will see Dr.  Ladona Ridgel Tuesday, December 18th at 9:40.   Her medications at discharge are as follows:  1. Amiodarone 200 mg 1 tab daily.  2. Coumadin 5 mg tablets 1 tab daily except one-half tab Tuesday and      Thursday.  She is to restart Coumadin in the morning of September 1.  3. Toprol-XL 50 mg daily.  4. Cardizem ER 180 mg daily.  5. Furosemide 40 mg daily.  6. Metolazone 2.5 mg 3 times a week.  She has been counseled that it works      best if she takes it 30 minutes prior to her furosemide dose.  7. Zyrtec 2 mg daily.  8. Klor-Con 20 mEq 2 tablets in morning, 1 tablet in the evening.  9. Zantac 75 mg twice daily. 10.Cipro 500 mg every 8 hours, to continue her  course of antibiotics until      finished.  11.Cardura XL 4 mg daily to take at bedtime.   LABORATORY STUDIES:  This admission, on August 30th, serum electrolytes:  Sodium 140, potassium 4.2, chloride 107, carbonate 26, BUN is 19, creatinine  1.1, and glucose is 94.  The PT on the day of discharge is 21.8 and  the INR is 1.8.  She will be given 7.5 mg of Coumadin today.  Troponin I  studies were obtained this admission; they were 0.01, then 0.01, then 0.01.  Her TSH was 2.589 this admission.  Her HgbA1c  was 5.8.  Her BNP was 674.  This is a 40-minute discharge.     ______________________________  Maple Mirza, PA    ______________________________  Doylene Canning. Ladona Ridgel, MD    GM/MEDQ  D:  08/09/2006  T:  08/09/2006  Job:  [id number]   cc:   Doylene Canning. Ladona Ridgel, MD  Wannetta Sender.

## 2011-04-27 NOTE — Procedures (Signed)
NAME:  Traci [name], Traci [name]                 ACCOUNT NO.:  [id number]   MEDICAL RECORD NO.:  [id number]          PATIENT TYPE:  OUT   LOCATION:  SLEEP CENTER                 FACILITY:  [facility]   PHYSICIAN:  Clinton D. Maple Hudson, M.D. DATE OF BIRTH:  07-12-1932   DATE OF STUDY:  04/11/2005                              NOCTURNAL POLYSOMNOGRAM   STUDY DATE:  May 3,2006   REFERRING PHYSICIAN:  Dr. Lindwood Qua   INDICATION FOR STUDY:  Hypersomnia with sleep apnea.  Epworth Sleepiness  Score 4/24, BMI 40.7, weight 260 pounds.   SLEEP ARCHITECTURE:  Short total sleep time 270 minutes with sleep  efficiency 58%.  Stage I was 11%, stage II 77%, stages III and IV were  absent, REM was 13% of total sleep time.  Sleep latency 32 minutes, REM  latency 249 minutes, awake after sleep onset 166 minutes, arousal index  increased at 36.   RESPIRATORY DATA:  Split-study protocol.  Respiratory disturbance index  (RDI, AHI) 41.6 obstructive events per hour indicating moderately severe  obstructive sleep apnea/hypopnea syndrome before CPAP.  This included 2  central apneas, 18 obstructive apneas and 82 hypopneas.  She slept only on  her left side.  REM RDI 7 per hour.  CPAP was titrated to 8 CWP, RDI 2.6 per  hour using a small ResMed Ultra Mirage Nasal/Oral CPAP Mask with heated  humidifier.   OXYGEN DATA:  Loud snoring with oxygen desaturation to a nadir of 88% before  CPAP.  After CPAP control saturation held 94-98% on room air.  She needed to  use a saline nasal spray.   CARDIAC DATA:  Sinus bradycardia 59-62 per minute.   MOVEMENT/PARASOMNIA:  A total of 144 limb jerks were recorded of which 30  were associated with arousal or awakening for a periodic limb movement with  arousal index of 6.7 per hour which is increased.   IMPRESSION/RECOMMENDATION:  1.  Moderately severe obstructive sleep apnea/hypopnea syndrome, respiratory      disturbance index 41.6 per hour with loud snoring and oxygen  desaturation to 88%.  2.  Successful continuous positive airway pressure titration to 8 CWP,      respiratory disturbance index 2.6 per hour, using a small ResMed Ultra      Mirage Nasal/Oral Mask with heated humidifier.  3.  Nasal congestion was symptomatic and treated by the patient with nasal      saline.  4.  Periodic limb movement with arousal, 6.7 per hour.  This can be      reevaluated clinically if still significant after she has adjusted to      continuous positive airway pressure therapy.      CDY/MEDQ  D:  04/15/2005 11:22:26  T:  04/15/2005 13:18:34  Job:  [id number]

## 2011-04-27 NOTE — Op Note (Signed)
Traci [name], Traci [name]                 ACCOUNT NO.:  [id number]   MEDICAL RECORD NO.:  [id number]          PATIENT TYPE:  INP   LOCATION:  2008                         FACILITY:  [facility]   PHYSICIAN:  Doylene Canning. Ladona Ridgel, MD    DATE OF BIRTH:  June 25, 1932   DATE OF PROCEDURE:  08/08/2006  DATE OF DISCHARGE:                                 OPERATIVE REPORT   SURGEON:  Doylene Canning. Ladona Ridgel, MD.   PROCEDURE PERFORMED:  Implantation of a dual-chamber pacemaker.   INDICATIONS:  Symptomatic bradycardia.   HISTORY:  The patient is a 77 year old woman with a history of paroxysmal  atrial fib, dizziness and chronic amiodarone therapy, who presented to the  emergency room in [facility], where she was found to be severely  bradycardic with heart rates in the 30s.  Her heart rate decreased into the  20s.  Her medications were held.  She is referred for pacemaker insertion,  as the patient requires significant AV node blocking drugs and amiodarone  for maintenance of sinus rhythm.   PROCEDURE:  After informed consent was obtained, the patient was taken to  the diagnostic EP lab in a fasting state.  After the usual preparation and  draping, intravenous fentanyl and midazolam were given for sedation.  Lidocaine 30 cc was infiltrated into the left infraclavicular region.  A 5-  cm incision was carried out over this region and electrocautery utilized to  dissect down to the fascial plane.  The left subclavian vein was punctured  and the Medtronic model 5076, 58-cm, active-fixation pacing lead, serial  #[id number], was advanced into the right ventricle, and the Medtronic model  5076, 52-cm, active-fixation pacing lead, serial #[id number], was advanced  into the right atrium.  Mapping was carried out at the final site, and the R  waves in the right ventricle measured 10 mV, and the pacing threshold was  0.6 V at 0.5 msec.  The pacing impedance was 1000 ohms, and 10-V pacing did  not stimulate the diaphragm.  With the ventricular lead in satisfactory  position, attention was then turned to placement of the atrial lead, which  was placed in the right atrial appendage, where P waves measured 4 mV, and  the pacing impedance, 600 ohms. The pacing threshold was 0.7 V at 0.5 msec.  Again, 10-V pacing did not stimulate the diaphragm.  With both leads in  satisfactory position, they were secured to the subpectoralis fascia with a  figure-of-eight silk suture.  The sewing sleeve was also secured out with  silk suture.  Electrocautery was  utilized to make a subcutaneous pocket.  Kanamycin irrigation was utilized to irrigate the pocket, and electrocautery  utilized to assure hemostasis.  The Medtronic Adapta ADDRO1 dual-chamber  pacemaker, serial [id number], was connected to the atrial and ventricular  pacing leads and placed in the subcutaneous pocket.  The generator was  secured with silk suture.  Additional kanamycin was then utilized to  irrigate the pocket, and the incision was closed with a layer of 2-0 Vicryl,  followed a layer of 3-0 Vicryl, followed by a layer  of 4-0 Vicryl.  Benzoin  was painted on the skin and Steri-Strips were applied.  A pressure dressing  was placed and the patient was returned to her room in satisfactory  condition.  There were no immediate procedural complications.   RESULTS:  This demonstrates successful implantation of a Medtronic dual-  chamber pacemaker in a patient with symptomatic bradycardia.           ______________________________  Doylene Canning. Ladona Ridgel, MD     GWT/MEDQ  D:  08/08/2006  T:  08/08/2006  Job:  [id number]   cc:   Wannetta Sender.

## 2011-04-27 NOTE — H&P (Signed)
Traci [name], Traci [name]                 ACCOUNT NO.:  [id number]   MEDICAL RECORD NO.:  [id number]          PATIENT TYPE:  INP   LOCATION:  2008                         FACILITY:  [facility]   PHYSICIAN:  Rollene Rotunda, MD, FACCDATE OF BIRTH:  [birth year]   DATE OF ADMISSION:  08/07/2006  DATE OF DISCHARGE:                                HISTORY & PHYSICAL   PRIMARY CARE PHYSICIAN:  Lindwood Qua, M.D.   PRIMARY CARDIOLOGIST:  Doylene Canning. Ladona Ridgel, M.D.   CHIEF COMPLAINTS:  Presyncope/bradycardia.   HISTORY OF PRESENT ILLNESS:  Traci [name] is a 77 year old white female with a  history of cardiac cath in 2005, as well as paroxysmal atrial fibrillation,  who has been maintained on amiodarone, a calcium blocker and a beta blocker.   Traci [name] has been seeing her primary care physician frequently of late  because of problems with hives for which her Nexium and Flomax have been  discontinued, as well as a urinary tract infection which was diagnosed 2  days ago.  She was started on antibiotics for this.  She was otherwise doing  well, although she notes some fatigue which is ongoing and chronic but has  gotten a little bit worse recently, and some increased dyspnea on exertion.  Yesterday, she had orthostatic symptoms including dizziness, diaphoresis and  presyncope.  She said that when she stood up she would have to sit back  down, otherwise she would fall.  Sometimes she could get up and take a few  steps before the symptoms would come on her, but they were consistent with  position change or ambulation yesterday.  Last night, she went to [facility] and was found to be in a junctional bradycardia with a heart rate  in the 20s and 30s.  She was admitted overnight.   Her Toprol, Cardizem and amiodarone were held.  Today, she was seen by her  primary care physician who said she needed to come to [facility] for a pacemaker.  At about 12:30 today, the patient stated that it felt like a switch had  been  flipped, and she began feeling much better.  The general malaise resolved  and her dyspnea on exertion is at its baseline.  There was also some chest  tightness, but this has resolved.  Her heart rate was still in the 40s and  50s, so she was transferred to [facility] for evaluation and  possible pacemaker insertion.   [name] has never had these kinds of symptoms before.  The fatigue that  she describes is with exertion.  Although she has had this fatigue, she has  been attending cardiac rehabilitation on a regular basis and has noted  improvement in her ability to walk on a treadmill since she started this  program.  She says that when she is at cardiac rehabilitation, her heart  rate will be in the 50s when she starts and in the 60s when she finishes,  but she does not remember higher heart rates.  Since her heart rate improved  into the 40s and 50s and she  converted from a junctional rhythm to sinus  bradycardia, she feels much better.   PAST MEDICAL HISTORY:  1. Diabetes.  2. Hyperlipidemia.  3. Obesity.  4. Hypertension.  5. Paroxysmal atrial fibrillation.  6. Obstructive sleep apnea (unable to tolerate CPAP in the past).  7. Status post cardiac catheterization in 2005 with normal coronary      arteries and a normal EF.  8. History of diverticulosis.  9. Remote history of CHF.  10.Anxiety.  11.Chronic anticoagulation with Coumadin.  12.COPD/chronic dyspnea on exertion.  13.Status post echocardiogram prior to her cath in 2005 that is reported      as showing LVH and diastolic dysfunction, although details are not      available.   SURGICAL HISTORY:  1. Status post cardiac catheterization.  2. Hysterectomy.  3. C-spine surgery.  4. Lower back surgery.  5. EGD.   ALLERGIES:  She is allergic or intolerant to:  1. PENICILLIN.  2. CODEINE.  3. SHELLFISH.  4. SULFA.  5. FLOMAX.  6. Most recently NEXIUM.   CURRENT MEDICATIONS:  1. Potassium 20 mEq  two tablets a.m., one tablet p.m.  2. Toprol XL 50 mg a day.  3. Cardizem CD 180 mg a day.  4. Pacerone 200 mg a day.  5. Coumadin 5 mg daily, except for 1/2 tablet on Tuesdays and Thursdays.  6. Cardura XL 4 mg daily.  7. Senokot p.r.n.  8. Zyrtec 10 mg a day.  9. Cipro 500 mg b.i.d. day 3 of 7.  10.Metolazone 2.5 mg daily (discontinued recently).  11.Lasix 40 mg a day.  12.Zantac 75 two tablets daily.   SOCIAL HISTORY:  She lives in [place] with her husband and is retired  from [employer].  She has a greater than 50 pack year history of tobacco  use and quit in 1998.  She does not abuse alcohol or drugs.   FAMILY HISTORY:  Her mother died at age 69 with heart failure, but Ms.  [name] does not know of any coronary artery disease.  Her father died at age  30 of cancer.  She has lost several siblings to cancer but none with heart  disease.   REVIEW OF SYSTEMS:  She denies any recent fevers or chills, although she did  have sweats yesterday with her other symptoms.  She has a postnasal drip  which she states makes her cough.  She denies wheezing.  She says she never  gets chest pain.  She has some chronic dyspnea on exertion but denies  orthopnea, PND or edema.  She does have occasional palpitations.  Presyncope  as described above.  She has some dysuria still.  She denies anxiety or  depression.  She denies arthralgias.  She has had no reflux symptoms since  she was put on a proton pump inhibitor 3 months ago after her EGD.  Review  of systems is otherwise negative.   PHYSICAL EXAMINATION:  VITAL SIGNS:  Temperature is 96.6, blood pressure  151/67, pulse 52, respiratory rate 20, O2 saturation 96% on room air.  GENERAL:  She is a well-developed obese white female in no acute distress.  HEENT:  Her head is normocephalic and atraumatic with pupils equal, round, and reactive to light and accommodation.  Extraocular movements intact.  Sclera clear.  Nares without discharge.  She  is edentulous.  NECK:  There is no JVD, no carotid bruits are appreciated.  No thyromegaly  is noted.  There is no lymphadenopathy.  CV:  Heart is regular in rate and rhythm with no significant murmur, rub or  gallop noted.  Distal pulses are 2+ in all four extremities.  LUNGS:  Clear to auscultation without wheezing or crackles.  SKIN:  She has some possible chronic stasis changes in her lower extremities  but no rashes or lesions are noted.  ABDOMEN:  Soft and nontender with active bowel sounds and no rebound, no  guarding is noted.  EXTREMITIES:  There is no edema noted.  MUSCULOSKELETAL:  There is no joint deformity or effusions and no spine or  CVA tenderness.  NEUROLOGIC:  She is alert and oriented with cranial nerves II-XII grossly  intact.   CHEST X-RAY:  Chest x-ray performed at [facility] showed stable mild  cardiomegaly with a lingular scar and no acute disease.   ELECTROCARDIOGRAM:  EKG performed at [facility] revealed junctional  bradycardia with no acute ischemic changes noted.  The rate is 37 beats per  minute.  The patient is currently in sinus bradycardia, rate in the high  40s/low 50s on telemetry, and a repeat EKG is pending.   LABORATORY VALUES:  Hemoglobin 14.2, hematocrit 41.0, WBCs 8.1, platelets  251.  Sodium 142, potassium 5.3, chloride 103, CO2 of 27, BUN 23, creatinine  1.3, glucose 113, INR 2.13.   IMPRESSION:  Ms. Illescas has symptomatic bradycardia.  However, the patient  was on Toprol and Cardizem, as well as amiodarone.  After these medications  have been held for 24 hours, she is currently in sinus bradycardia and her  symptoms are much improved.  We will continue to hold the Cardizem and  metoprolol.  We will resume the amiodarone, but no urgent pacemaker is  indicated.  She will be evaluated by Dr. Ladona Ridgel for possible chronotropic  incompetence.  She will most likely need additional medical therapy for  blood pressure control, but her  medications can be adjusted tomorrow.  We  will cycle cardiac enzymes and recheck a BMP which was possibly elevated at  [facility], although it is difficult to be sure secondary to handwriting  issues.  Ms. Shoberg will be continued on her  home medications, and we will use sliding scale insulin, as well as checking  a hemoglobin A1c and thyroid functions.   Dr. Rollene Rotunda saw the patient and determined the plan of care.     ______________________________  Theodore Demark, PA-C    ______________________________  Rollene Rotunda, MD, [facility]    RB/MEDQ  D:  08/07/2006  T:  08/07/2006  Job:  [id number]   cc:   Wannetta Sender.

## 2011-04-27 NOTE — Assessment & Plan Note (Signed)
[facility]                           ELECTROPHYSIOLOGY OFFICE NOTE   NAME:COOPERAveriana, Clouatre                        MRN:          [id number]  DATE:08/26/2006                            DOB:          11-Mar-1932    Ms. Zirkle was seen in the clinic today, August 26, 2006, for a wound  check of her newly implanted Medtronic Adapta AVDRO1.  Date of implant was  August 08, 2006, for bradycardia.  On interrogation of her device today, her  batter voltage was 2.78.  T-waves measured 1.4 to 2.0 mV with an atrial  captured threshold of 1 V at 0.4 msec and an atrial lead impendence of 567.  R-waves measured 15.68 to 22.40 mV with a ventricular pacing threshold of  0.75 V at 0.4 msec and a ventricular lead impedence of 726.  No episodes  noted.  No changes in her parameters.  Her Steri-strips were removed today.  Her wound was without redness, edema, and she will followup in 3 months'  time with Dr. Ladona Ridgel.                                   Altha Harm, LPN                                Doylene Canning. Ladona Ridgel, MD   PO/MedQ  DD:  08/26/2006  DT:  08/26/2006  Job #:  [id number]

## 2011-04-27 NOTE — Assessment & Plan Note (Signed)
[facility]                         ELECTROPHYSIOLOGY OFFICE NOTE   NAME:COOPERRuchama, [name]                        MRN:          [id number]  DATE:11/26/2006                            DOB:          Aug 02, 1932    Traci [name] returns today for followup.  She is a very pleasant 77-year-  old woman with a history of paroxysmal atrial fibrillation on amiodarone  who has symptomatic bradycardia and presyncope who underwent permanent  pacemaker insertion back in August.  She returns today for followup.  She has had no palpitations or shortness of breath.  She does note  occasional episodes of chest pain.  She has a history of a negative  catheterization with normal coronaries and normal LV function back in  2005.  She denies syncope or dizziness.   EXAMINATION:  :  GENERAL:  She is a pleasant 76 year old woman in no distress.  VITAL SIGNS:  Blood pressure is 152/90, the pulse 90 and regular,  respirations were 18.  The weight was 253 pounds.  NECK:  Revealed no jugular venous distention.  LUNGS:  Clear bilaterally to auscultation.  CARDIOVASCULAR:  Had a regular rate and rhythm with normal S1 and S2.  EXTREMITIES:  Demonstrated trace peripheral edema bilaterally.   Interrogation of her pacemaker demonstrates a Medtronic Adapta with P  waves of 2 and R waves of 11, the impedance 548 in the atrium and 759 in  the ventricle, the threshold 1 volt at 0.4 in both the right atrium and  right ventricle.  The battery voltage was 2.77 volts.  She was 100% A-  paced.  She was completely V-sensed with an AV interval of 280  milliseconds.   IMPRESSION:  1. Symptomatic bradycardia.  2. Hypertension.  3. Obesity.  4. Status post pacemaker insertion.   DISCUSSION:  Overall, Traci [name] is stable and her pacemaker is working  normally.  We will plan to see her back in the office in August for  pacemaker followup.     Doylene Canning. Ladona Ridgel, MD  Electronically Signed    GWT/MedQ  DD: 11/26/2006  DT: 11/26/2006  Job #: [id number]   cc:   Wannetta Sender.

## 2011-06-07 ENCOUNTER — Other Ambulatory Visit: Payer: Self-pay | Admitting: Internal Medicine

## 2011-06-07 ENCOUNTER — Ambulatory Visit (INDEPENDENT_AMBULATORY_CARE_PROVIDER_SITE_OTHER): Payer: Medicare Other | Admitting: *Deleted

## 2011-06-07 DIAGNOSIS — I4891 Unspecified atrial fibrillation: Secondary | ICD-10-CM

## 2011-06-07 DIAGNOSIS — Z950 Presence of cardiac pacemaker: Secondary | ICD-10-CM

## 2011-06-07 DIAGNOSIS — I498 Other specified cardiac arrhythmias: Secondary | ICD-10-CM

## 2011-06-10 LAB — REMOTE PACEMAKER DEVICE
AL THRESHOLD: 1.25 V
ATRIAL PACING PM: 100
ATRIAL PACING PM: 100
BAMS-0001: 175 {beats}/min
BAMS-0001: 175 {beats}/min
BATTERY VOLTAGE: 2.77 V
BATTERY VOLTAGE: 2.77 V
BAVD-0001: 250 ms
BAVD-0002: 250 ms
BMOD-0003: 30
BMOD-0005: 95 {beats}/min
BPAC-0002RA: 2.5 V
BPAC-0002RV: 2.5 V
BRDY-0004: 130 {beats}/min
BSEN-0002RA: 0.35 mv
BSEN-0004RV: 230 ms
BSEN-0005RA: 180 ms
BSEN-0005RV: 28 ms
DEV-0004LDO: 5076
DEV-0004LDO: 5076
DEV-0006LDO: 20070830
DEV-0020PM: NEGATIVE
DEV-0023LDO: 0
DEV-0024PM: 85
DEV-0025PM: 65
EVAL-0001E5: 20120628090900
RV LEAD THRESHOLD: 0.875 V
TCAP-0003RA: 0.4 ms
VENTRICULAR PACING PM: 0
VENTRICULAR PACING PM: 0

## 2011-06-12 ENCOUNTER — Encounter: Payer: Self-pay | Admitting: *Deleted

## 2011-06-12 NOTE — Progress Notes (Signed)
Pacer remote check  

## 2011-07-25 ENCOUNTER — Encounter: Payer: Self-pay | Admitting: Internal Medicine

## 2011-08-17 ENCOUNTER — Encounter: Payer: Self-pay | Admitting: Internal Medicine

## 2011-08-17 ENCOUNTER — Ambulatory Visit (INDEPENDENT_AMBULATORY_CARE_PROVIDER_SITE_OTHER): Payer: Medicare Other | Admitting: Internal Medicine

## 2011-08-17 DIAGNOSIS — E669 Obesity, unspecified: Secondary | ICD-10-CM

## 2011-08-17 DIAGNOSIS — I4891 Unspecified atrial fibrillation: Secondary | ICD-10-CM

## 2011-08-17 DIAGNOSIS — Z950 Presence of cardiac pacemaker: Secondary | ICD-10-CM

## 2011-08-17 DIAGNOSIS — I498 Other specified cardiac arrhythmias: Secondary | ICD-10-CM

## 2011-08-17 LAB — PACEMAKER DEVICE OBSERVATION
AL AMPLITUDE: 1.4 mv
AL THRESHOLD: 1 V
BAMS-0001: 175 {beats}/min
RV LEAD AMPLITUDE: 15.68 mv
RV LEAD THRESHOLD: 1 V

## 2011-08-17 NOTE — Progress Notes (Signed)
HPI Mrs. Double returns today for followup. She is a 77 year old woman with a history of atrial fibrillation, hypertension, status post primary pacemaker insertion. The patient has been quite sedentary. Since breaking her foot, she has been fairly tired and fatigued. She has dyspnea with exertion. She denies syncope. Minimal to no palpitations. Allergies  Allergen Reactions  . Codeine   . Penicillins   . Sulfonamide Derivatives      Current Outpatient Prescriptions  Medication Sig Dispense Refill  . amiodarone (PACERONE) 200 MG tablet Take 200 mg by mouth daily.        . cetirizine (ZYRTEC) 10 MG tablet Take 10 mg by mouth daily.        Marland Kitchen diltiazem (TIAZAC) 180 MG 24 hr capsule Take 180 mg by mouth daily.        Marland Kitchen doxazosin (CARDURA) 2 MG tablet Take 2 mg by mouth at bedtime.        Marland Kitchen esomeprazole (NEXIUM) 40 MG capsule Take 40 mg by mouth daily before breakfast.        . furosemide (LASIX) 40 MG tablet Take 40 mg by mouth daily.        . metolazone (ZAROXOLYN) 2.5 MG tablet Take 2.5 mg by mouth daily.        . metoprolol (TOPROL-XL) 100 MG 24 hr tablet Take 100 mg by mouth daily.        . potassium chloride SA (K-DUR,KLOR-CON) 20 MEQ tablet 2 in am 1 in pm       . SUCRALFATE PO Take by mouth 4 (four) times daily.        Marland Kitchen warfarin (COUMADIN) 5 MG tablet Use as directed by the anticoagulation clinic          Past Medical History  Diagnosis Date  . DOE (dyspnea on exertion)   . COPD (chronic obstructive pulmonary disease)   . Anxiety   . CHF (congestive heart failure)   . Diverticulitis of colon   . OSA (obstructive sleep apnea)   . Paroxysmal atrial fibrillation   . HTN (hypertension)   . Obesity   . Hyperlipidemia   . DM (diabetes mellitus)     ROS:   All systems reviewed and negative except as noted in the HPI.   Past Surgical History  Procedure Date  . Cardiac catheterization   . Abdominal hysterectomy   . C-spine surgery   . Lower back surgery   .  Esophagogastroduodenoscopy      Family History  Problem Relation Age of Onset  . Heart failure Mother   . Cancer Father   . Cancer      several siblings     History   Social History  . Marital Status: Married    Spouse Name: N/A    Number of Children: N/A  . Years of Education: N/A   Occupational History  . Retired from [employer]    Social History Main Topics  . Smoking status: Former Smoker    Quit date: 12/10/1996  . Smokeless tobacco: Not on file   Comment: gerater than 50 pack year history of tobacco-quit in 1998  . Alcohol Use: No  . Drug Use: No  . Sexually Active: Not on file   Other Topics Concern  . Not on file   Social History Narrative  . No narrative on file     BP 140/75  Pulse 87  Resp 18  Ht 5\' 6"  (1.676 m)  Wt 254 lb (115.214 kg)  BMI 41.00 kg/m2  Physical Exam: Obese, well appearing, elderly woman, NAD HEENT: Unremarkable Neck:  No JVD, no thyromegally Lymphatics:  No adenopathy Back:  No CVA tenderness Lungs:  Clear. Well-healed pacemaker incision. HEART:  Regular rate rhythm, no murmurs, no rubs, no clicks Abd:  soft, obese, positive bowel sounds, no organomegally, no rebound, no guarding Ext:  2 plus pulses, no edema, no cyanosis, no clubbing Skin:  No rashes no nodules Neuro:  CN II through XII intact, motor grossly intact  EKG  DEVICE  Normal device function.  See PaceArt for details.   Assess/Plan:

## 2011-08-17 NOTE — Patient Instructions (Signed)
Your physician wants you to follow-up in: 12 months with Dr Taylor You will receive a reminder letter in the mail two months in advance. If you don't receive a letter, please call our office to schedule the follow-up appointment.   Remote monitoring is used to monitor your Pacemaker of ICD from home. This monitoring reduces the number of office visits required to check your device to one time per year. It allows us to keep an eye on the functioning of your device to ensure it is working properly. You are scheduled for a device check from home on 11/15/2011. You may send your transmission at any time that day. If you have a wireless device, the transmission will be sent automatically. After your physician reviews your transmission, you will receive a postcard with your next transmission date.   

## 2011-08-17 NOTE — Assessment & Plan Note (Signed)
I have asked that the patient was at least 10 pounds before I see her again next year. Importance of regular activity and eating less have been discussed with the patient.

## 2011-08-17 NOTE — Assessment & Plan Note (Signed)
She appears to be maintaining sinus rhythm on amiodarone therapy. Review of her pacemaker histograms demonstrates that she has been in sinus rhythm greater than 99% of the time.

## 2011-08-17 NOTE — Assessment & Plan Note (Signed)
Her device is working normally. Plan to recheck in several months. 

## 2011-11-15 ENCOUNTER — Other Ambulatory Visit: Payer: Self-pay | Admitting: Internal Medicine

## 2011-11-15 ENCOUNTER — Encounter: Payer: Self-pay | Admitting: Internal Medicine

## 2011-11-15 ENCOUNTER — Ambulatory Visit (INDEPENDENT_AMBULATORY_CARE_PROVIDER_SITE_OTHER): Payer: Medicare Other | Admitting: *Deleted

## 2011-11-15 DIAGNOSIS — Z950 Presence of cardiac pacemaker: Secondary | ICD-10-CM

## 2011-11-15 DIAGNOSIS — I4891 Unspecified atrial fibrillation: Secondary | ICD-10-CM

## 2011-11-18 LAB — REMOTE PACEMAKER DEVICE
AL IMPEDENCE PM: 532 Ohm
ATRIAL PACING PM: 100
BATTERY VOLTAGE: 2.77 V
RV LEAD IMPEDENCE PM: 810 Ohm
VENTRICULAR PACING PM: 0

## 2011-11-29 NOTE — Progress Notes (Signed)
Remote pacer check  

## 2011-12-12 ENCOUNTER — Encounter: Payer: Self-pay | Admitting: *Deleted

## 2012-02-21 ENCOUNTER — Encounter: Payer: Self-pay | Admitting: Internal Medicine

## 2012-02-21 ENCOUNTER — Ambulatory Visit (INDEPENDENT_AMBULATORY_CARE_PROVIDER_SITE_OTHER): Payer: Medicare Other | Admitting: *Deleted

## 2012-02-21 DIAGNOSIS — I498 Other specified cardiac arrhythmias: Secondary | ICD-10-CM

## 2012-02-21 DIAGNOSIS — I4891 Unspecified atrial fibrillation: Secondary | ICD-10-CM

## 2012-02-22 LAB — REMOTE PACEMAKER DEVICE
BAMS-0001: 175 {beats}/min
RV LEAD AMPLITUDE: 11.2 mv
VENTRICULAR PACING PM: 0

## 2012-02-26 ENCOUNTER — Encounter: Payer: Self-pay | Admitting: *Deleted

## 2012-03-06 NOTE — Progress Notes (Signed)
Remote pacer check  

## 2012-05-29 ENCOUNTER — Encounter: Payer: Self-pay | Admitting: Internal Medicine

## 2012-05-29 ENCOUNTER — Ambulatory Visit (INDEPENDENT_AMBULATORY_CARE_PROVIDER_SITE_OTHER): Payer: Medicare Other | Admitting: *Deleted

## 2012-05-29 DIAGNOSIS — I4891 Unspecified atrial fibrillation: Secondary | ICD-10-CM

## 2012-05-29 DIAGNOSIS — Z950 Presence of cardiac pacemaker: Secondary | ICD-10-CM

## 2012-06-06 LAB — REMOTE PACEMAKER DEVICE
AL THRESHOLD: 1.125 V
BATTERY VOLTAGE: 2.76 V
RV LEAD IMPEDENCE PM: 1346 Ohm
VENTRICULAR PACING PM: 0

## 2012-07-04 ENCOUNTER — Encounter: Payer: Self-pay | Admitting: *Deleted

## 2012-08-22 ENCOUNTER — Encounter: Payer: Self-pay | Admitting: *Deleted

## 2012-09-02 ENCOUNTER — Encounter: Payer: Self-pay | Admitting: Internal Medicine

## 2012-09-02 ENCOUNTER — Ambulatory Visit (INDEPENDENT_AMBULATORY_CARE_PROVIDER_SITE_OTHER): Payer: Medicare Other | Admitting: Internal Medicine

## 2012-09-02 VITALS — BP 110/61 | HR 67 | Ht 66.3 in | Wt 258.1 lb

## 2012-09-02 DIAGNOSIS — I4891 Unspecified atrial fibrillation: Secondary | ICD-10-CM

## 2012-09-02 DIAGNOSIS — E669 Obesity, unspecified: Secondary | ICD-10-CM

## 2012-09-02 DIAGNOSIS — Z950 Presence of cardiac pacemaker: Secondary | ICD-10-CM

## 2012-09-02 LAB — PACEMAKER DEVICE OBSERVATION
AL IMPEDENCE PM: 524 Ohm
ATRIAL PACING PM: 100
BATTERY VOLTAGE: 2.76 V

## 2012-09-02 NOTE — Assessment & Plan Note (Signed)
I. continue discussed the importance of eating less and exercise as we lose weight. She admits to dietary indiscretion. She states she will try to do better. I've asked the patient to lose 15 pounds.

## 2012-09-02 NOTE — Assessment & Plan Note (Signed)
Her Medtronic dual-chamber pacemaker is working normally. She has approximately 3-1/2 years of battery longevity. We'll plan to recheck in several months.

## 2012-09-02 NOTE — Progress Notes (Signed)
HPI Traci [name] returns today for followup. She is a morbidly obese, 78 year old woman, with hypertension, symptomatic bradycardia, paroxysmal atrial fibrillation, status post permanent pacemaker insertion. When I saw the patient last a year ago, I asked her lose at least 10 pounds. She has gained 5 pounds. She has been bothered by arthritis. She has gout. Allergies  Allergen Reactions  . Codeine   . Penicillins   . Shellfish Allergy   . Sulfonamide Derivatives      Current Outpatient Prescriptions  Medication Sig Dispense Refill  . allopurinol (ZYLOPRIM) 300 MG tablet Take 300 mg by mouth daily.      Marland Kitchen amiodarone (PACERONE) 200 MG tablet Take 200 mg by mouth daily.        . cetirizine (ZYRTEC) 10 MG tablet Take 10 mg by mouth daily.        Marland Kitchen diltiazem (TIAZAC) 180 MG 24 hr capsule Take 180 mg by mouth daily.        Marland Kitchen doxazosin (CARDURA) 2 MG tablet Take 2 mg by mouth at bedtime.        Marland Kitchen esomeprazole (NEXIUM) 40 MG capsule Take 40 mg by mouth daily before breakfast.        . furosemide (LASIX) 40 MG tablet Take 40 mg by mouth daily.        . metolazone (ZAROXOLYN) 2.5 MG tablet Take 2.5 mg by mouth daily.        . metoprolol (TOPROL-XL) 100 MG 24 hr tablet Take 100 mg by mouth daily.        . potassium chloride SA (K-DUR,KLOR-CON) 20 MEQ tablet 2 in am 1 in pm       . warfarin (COUMADIN) 5 MG tablet Use as directed by the anticoagulation clinic          Past Medical History  Diagnosis Date  . DOE (dyspnea on exertion)   . COPD (chronic obstructive pulmonary disease)   . Anxiety   . CHF (congestive heart failure)   . Diverticulitis of colon   . OSA (obstructive sleep apnea)   . Paroxysmal atrial fibrillation   . HTN (hypertension)   . Obesity   . Hyperlipidemia   . DM (diabetes mellitus)     ROS:   All systems reviewed and negative except as noted in the HPI.   Past Surgical History  Procedure Date  . Cardiac catheterization   . Abdominal hysterectomy   . C-spine  surgery   . Lower back surgery   . Esophagogastroduodenoscopy      Family History  Problem Relation Age of Onset  . Heart failure Mother   . Cancer Father   . Cancer      several siblings     History   Social History  . Marital Status: Married    Spouse Name: N/A    Number of Children: N/A  . Years of Education: N/A   Occupational History  . Retired from [employer]    Social History Main Topics  . Smoking status: Former Smoker    Quit date: 12/10/1996  . Smokeless tobacco: Not on file   Comment: gerater than 50 pack year history of tobacco-quit in 1998  . Alcohol Use: No  . Drug Use: No  . Sexually Active: Not on file   Other Topics Concern  . Not on file   Social History Narrative  . No narrative on file     BP 110/61  Pulse 67  Ht 5' 6.3" (1.684 m)  Wt 258 lb 1.9 oz (117.082 kg)  BMI 41.29 kg/m2  Physical Exam:  Well appearing morbidly obese, elderly woman, NAD HEENT: Unremarkable Neck:  No JVD, no thyromegally Lungs:  Clear with no wheezes, rales, or rhonchi. Well-healed pacemaker incision. HEART:  Regular rate rhythm, no murmurs, no rubs, no clicks Abd:  soft, positive bowel sounds, no organomegally, no rebound, no guarding Ext:  2 plus pulses, no edema, no cyanosis, no clubbing Skin:  No rashes no nodules Neuro:  CN II through XII intact, motor grossly intact DEVICE  Normal device function.  See PaceArt for details.   Assess/Plan:

## 2012-09-02 NOTE — Patient Instructions (Addendum)
Your physician wants you to follow-up in: 12 months with Dr Taylor You will receive a reminder letter in the mail two months in advance. If you don't receive a letter, please call our office to schedule the follow-up appointment.    Remote monitoring is used to monitor your Pacemaker of ICD from home. This monitoring reduces the number of office visits required to check your device to one time per year. It allows us to keep an eye on the functioning of your device to ensure it is working properly. You are scheduled for a device check from home on 12/08/12. You may send your transmission at any time that day. If you have a wireless device, the transmission will be sent automatically. After your physician reviews your transmission, you will receive a postcard with your next transmission date.    

## 2012-09-02 NOTE — Assessment & Plan Note (Signed)
She is maintaining sinus rhythm 99.8% of the time. She will continue low-dose amiodarone.

## 2012-12-08 ENCOUNTER — Encounter: Payer: Medicare Other | Admitting: *Deleted

## 2012-12-12 ENCOUNTER — Encounter: Payer: Self-pay | Admitting: *Deleted

## 2012-12-22 ENCOUNTER — Encounter: Payer: Self-pay | Admitting: Internal Medicine

## 2012-12-22 ENCOUNTER — Ambulatory Visit (INDEPENDENT_AMBULATORY_CARE_PROVIDER_SITE_OTHER): Payer: Medicare Other | Admitting: *Deleted

## 2012-12-22 DIAGNOSIS — I4891 Unspecified atrial fibrillation: Secondary | ICD-10-CM

## 2012-12-22 DIAGNOSIS — Z950 Presence of cardiac pacemaker: Secondary | ICD-10-CM

## 2012-12-22 LAB — REMOTE PACEMAKER DEVICE
AL THRESHOLD: 1.125 V
BAMS-0001: 175 {beats}/min
RV LEAD AMPLITUDE: 11.2 mv

## 2012-12-24 ENCOUNTER — Encounter: Payer: Self-pay | Admitting: *Deleted

## 2013-03-23 ENCOUNTER — Encounter: Payer: Self-pay | Admitting: Internal Medicine

## 2013-03-23 ENCOUNTER — Ambulatory Visit (INDEPENDENT_AMBULATORY_CARE_PROVIDER_SITE_OTHER): Payer: Medicare Other | Admitting: *Deleted

## 2013-03-23 ENCOUNTER — Other Ambulatory Visit: Payer: Self-pay | Admitting: Internal Medicine

## 2013-03-23 DIAGNOSIS — I4891 Unspecified atrial fibrillation: Secondary | ICD-10-CM

## 2013-03-23 DIAGNOSIS — Z950 Presence of cardiac pacemaker: Secondary | ICD-10-CM

## 2013-04-06 LAB — REMOTE PACEMAKER DEVICE
BATTERY VOLTAGE: 2.76 V
RV LEAD AMPLITUDE: 22.4 mv
RV LEAD IMPEDENCE PM: 1410 Ohm
VENTRICULAR PACING PM: 0

## 2013-04-30 ENCOUNTER — Encounter: Payer: Self-pay | Admitting: *Deleted

## 2013-06-29 ENCOUNTER — Ambulatory Visit (INDEPENDENT_AMBULATORY_CARE_PROVIDER_SITE_OTHER): Payer: Medicare Other | Admitting: *Deleted

## 2013-06-29 ENCOUNTER — Encounter: Payer: Self-pay | Admitting: Internal Medicine

## 2013-06-29 DIAGNOSIS — Z950 Presence of cardiac pacemaker: Secondary | ICD-10-CM

## 2013-06-29 DIAGNOSIS — I4891 Unspecified atrial fibrillation: Secondary | ICD-10-CM

## 2013-07-01 LAB — REMOTE PACEMAKER DEVICE
BAMS-0001: 175 {beats}/min
RV LEAD AMPLITUDE: 22.4 mv
RV LEAD THRESHOLD: 1.875 V
VENTRICULAR PACING PM: 0

## 2013-07-31 ENCOUNTER — Encounter: Payer: Self-pay | Admitting: *Deleted

## 2013-08-27 ENCOUNTER — Encounter: Payer: Self-pay | Admitting: Internal Medicine

## 2013-08-27 ENCOUNTER — Ambulatory Visit (INDEPENDENT_AMBULATORY_CARE_PROVIDER_SITE_OTHER): Payer: Medicare Other | Admitting: Internal Medicine

## 2013-08-27 VITALS — BP 116/60 | HR 66 | Ht 66.0 in | Wt 254.0 lb

## 2013-08-27 DIAGNOSIS — I4891 Unspecified atrial fibrillation: Secondary | ICD-10-CM

## 2013-08-27 DIAGNOSIS — I509 Heart failure, unspecified: Secondary | ICD-10-CM

## 2013-08-27 LAB — PACEMAKER DEVICE OBSERVATION
AL AMPLITUDE: 2 mv
AL IMPEDENCE PM: 524 Ohm
BAMS-0001: 175 {beats}/min
BATTERY VOLTAGE: 2.75 V
RV LEAD AMPLITUDE: 15.68 mv
RV LEAD IMPEDENCE PM: 1298 Ohm

## 2013-08-27 MED ORDER — AMIODARONE HCL 200 MG PO TABS: 100.0000 mg | ORAL_TABLET | Freq: Every day | ORAL | Status: DC

## 2013-08-27 NOTE — Assessment & Plan Note (Signed)
Her chronic diastolic heart failure is class II. I've encouraged the patient to increase her physical activity, and maintain a low-sodium diet.

## 2013-08-27 NOTE — Patient Instructions (Addendum)
Remote monitoring is used to monitor your Pacemaker of ICD from home. This monitoring reduces the number of office visits required to check your device to one time per year. It allows [place] to keep an eye on the functioning of your device to ensure it is working properly. You are scheduled for a device check from home on 11/30/2013 [facility]. You may send your transmission at any time that day. If you have a wireless device, the transmission will be sent automatically. After your physician reviews your transmission, you will receive a postcard with your next transmission date.  Your physician wants you to follow-up in: 12 months with Dr. Ladona Ridgel. You will receive a reminder letter in the mail two months in advance. If you don't receive a letter, please call our office to schedule the follow-up appointment.  Your physician has recommended you make the following change in your medication:   1. Decrease Amiodarone to 1/2 tablet daily.  Your physician recommends that you have labs drawn: TSH, Free T4

## 2013-08-27 NOTE — Assessment & Plan Note (Signed)
It is unclear what role amiodarone was playing in her symptoms. I've asked the patient to reduce her dose of amiodarone to 100 mg daily. Until now, she is maintaining sinus rhythm over 99.9% the time.

## 2013-08-27 NOTE — Progress Notes (Signed)
HPI Traci [name] returns today for followup. She is a pleasant 79 year old woman with chronic diastolic heart failure, atrial fibrillation, morbid obesity, and symptomatic bradycardia, status post pacemaker insertion. In the interim, she has noticed fatigue and weakness, and knee swelling. She denies syncope, chest pain, or shortness of breath. She has not been hospitalized. She denies fevers or chills. No cough. Allergies  Allergen Reactions  . Codeine   . Penicillins   . Shellfish Allergy   . Sulfonamide Derivatives      Current Outpatient Prescriptions  Medication Sig Dispense Refill  . allopurinol (ZYLOPRIM) 100 MG tablet Take 100 mg by mouth daily.      Marland Kitchen amiodarone (PACERONE) 200 MG tablet Take 0.5 tablets (100 mg total) by mouth daily.  30 tablet  4  . diltiazem (TIAZAC) 180 MG 24 hr capsule Take 180 mg by mouth daily.        Marland Kitchen doxazosin (CARDURA) 2 MG tablet Take 2 mg by mouth at bedtime.        . furosemide (LASIX) 40 MG tablet Take 40 mg by mouth daily.        Marland Kitchen HYDROcodone-acetaminophen (NORCO/VICODIN) 5-325 MG per tablet Take 1 tablet by mouth every 6 (six) hours as needed for pain.      . metolazone (ZAROXOLYN) 2.5 MG tablet Take 2.5 mg by mouth daily.       . metoprolol (TOPROL-XL) 100 MG 24 hr tablet Take 100 mg by mouth daily.        Marland Kitchen omeprazole (PRILOSEC) 40 MG capsule Take 40 mg by mouth daily.      . potassium chloride SA (K-DUR,KLOR-CON) 20 MEQ tablet 2 in am 2 in pm      . warfarin (COUMADIN) 5 MG tablet Use as directed by the anticoagulation clinic        No current facility-administered medications for this visit.     Past Medical History  Diagnosis Date  . DOE (dyspnea on exertion)   . COPD (chronic obstructive pulmonary disease)   . Anxiety   . CHF (congestive heart failure)   . Diverticulitis of colon   . OSA (obstructive sleep apnea)   . Paroxysmal atrial fibrillation   . HTN (hypertension)   . Obesity   . Hyperlipidemia   . DM (diabetes mellitus)      ROS:   All systems reviewed and negative except as noted in the HPI.   Past Surgical History  Procedure Laterality Date  . Cardiac catheterization    . Abdominal hysterectomy    . C-spine surgery    . Lower back surgery    . Esophagogastroduodenoscopy       Family History  Problem Relation Age of Onset  . Heart failure Mother   . Cancer Father   . Cancer      several siblings     History   Social History  . Marital Status: Married    Spouse Name: N/A    Number of Children: N/A  . Years of Education: N/A   Occupational History  . Retired from [employer]    Social History Main Topics  . Smoking status: Former Smoker    Quit date: 12/10/1996  . Smokeless tobacco: Not on file     Comment: gerater than 50 pack year history of tobacco-quit in 1998  . Alcohol Use: No  . Drug Use: No  . Sexual Activity: Not on file   Other Topics Concern  . Not on file   Social  History Narrative  . No narrative on file     BP 116/60  Pulse 66  Ht 5\' 6"  (1.676 m)  Wt 254 lb (115.214 kg)  BMI 41.02 kg/m2  Physical Exam:  obese appearing elderly woman, NAD HEENT: Unremarkable Neck: 6 cm JVD, no thyromegally Back:  No CVA tenderness Lungs:  Clear with no wheezes, rales, or rhonchi. HEART:  Regular rate rhythm, no murmurs, no rubs, no clicks Abd:  Soft, obese, positive bowel sounds, no organomegally, no rebound, no guarding Ext:  2 plus pulses, no edema, no cyanosis, no clubbing Skin:  No rashes no nodules Neuro:  CN II through XII intact, motor grossly intact  DEVICE  Normal device function.  See PaceArt for details.   Assess/Plan:

## 2013-09-14 ENCOUNTER — Encounter: Payer: Self-pay | Admitting: Internal Medicine

## 2013-11-18 ENCOUNTER — Telehealth: Payer: Self-pay

## 2013-11-18 NOTE — Telephone Encounter (Signed)
Patient is taking Amiodarone 200mg  daily.  When she tried to decrease to 100mg  she was having more afib so she went back to 200mg  daily.  Will call in RX for 200mg  daily

## 2013-11-19 ENCOUNTER — Other Ambulatory Visit: Payer: Self-pay

## 2013-11-19 DIAGNOSIS — I4891 Unspecified atrial fibrillation: Secondary | ICD-10-CM

## 2013-11-19 MED ORDER — AMIODARONE HCL 200 MG PO TABS: 200.0000 mg | ORAL_TABLET | Freq: Every day | ORAL | Status: AC

## 2013-11-30 ENCOUNTER — Encounter: Payer: Self-pay | Admitting: Internal Medicine

## 2013-11-30 ENCOUNTER — Ambulatory Visit (INDEPENDENT_AMBULATORY_CARE_PROVIDER_SITE_OTHER): Payer: Medicare Other | Admitting: *Deleted

## 2013-11-30 DIAGNOSIS — I4891 Unspecified atrial fibrillation: Secondary | ICD-10-CM

## 2013-11-30 LAB — MDC_IDC_ENUM_SESS_TYPE_REMOTE
Battery Remaining Longevity: 30 mo
Brady Statistic AS VS Percent: 0 %
Lead Channel Impedance Value: 1488 Ohm
Lead Channel Pacing Threshold Amplitude: 1.375 V
Lead Channel Pacing Threshold Pulse Width: 0.4 ms
Lead Channel Sensing Intrinsic Amplitude: 22.4 mV
Lead Channel Setting Pacing Amplitude: 2 V
Lead Channel Setting Sensing Sensitivity: 5.6 mV
MDC IDC MSMT BATTERY IMPEDANCE: 1661 Ohm
MDC IDC MSMT BATTERY VOLTAGE: 2.74 V
MDC IDC MSMT LEADCHNL RA IMPEDANCE VALUE: 547 Ohm
MDC IDC MSMT LEADCHNL RA PACING THRESHOLD AMPLITUDE: 1 V
MDC IDC MSMT LEADCHNL RA PACING THRESHOLD PULSEWIDTH: 0.4 ms
MDC IDC SESS DTM: 20141222183009
MDC IDC SET LEADCHNL RV PACING AMPLITUDE: 2.5 V
MDC IDC SET LEADCHNL RV PACING PULSEWIDTH: 0.4 ms
MDC IDC STAT BRADY AP VP PERCENT: 0 %
MDC IDC STAT BRADY AP VS PERCENT: 100 %
MDC IDC STAT BRADY AS VP PERCENT: 0 %

## 2013-12-23 ENCOUNTER — Encounter: Payer: Self-pay | Admitting: *Deleted

## 2014-03-03 ENCOUNTER — Ambulatory Visit (INDEPENDENT_AMBULATORY_CARE_PROVIDER_SITE_OTHER): Payer: Medicare Other | Admitting: *Deleted

## 2014-03-03 DIAGNOSIS — I509 Heart failure, unspecified: Secondary | ICD-10-CM

## 2014-03-03 DIAGNOSIS — I4891 Unspecified atrial fibrillation: Secondary | ICD-10-CM

## 2014-03-05 ENCOUNTER — Encounter: Payer: Self-pay | Admitting: Internal Medicine

## 2014-03-05 LAB — MDC_IDC_ENUM_SESS_TYPE_REMOTE
Battery Impedance: 1749 Ohm
Battery Remaining Longevity: 2.5
Battery Voltage: 2.74 V
Brady Statistic AP VP Percent: 0.1 %
Brady Statistic AP VS Percent: 99.6 %
Brady Statistic AS VP Percent: 0.1 % — CL
Lead Channel Impedance Value: 1483 Ohm
Lead Channel Impedance Value: 525 Ohm
Lead Channel Pacing Threshold Pulse Width: 0.4 ms
Lead Channel Setting Pacing Amplitude: 2 V
Lead Channel Setting Pacing Amplitude: 2.5 V
Lead Channel Setting Pacing Pulse Width: 0.4 ms
Lead Channel Setting Sensing Sensitivity: 5.6 mV
MDC IDC MSMT LEADCHNL RA PACING THRESHOLD AMPLITUDE: 1 V
MDC IDC MSMT LEADCHNL RA PACING THRESHOLD PULSEWIDTH: 0.4 ms
MDC IDC MSMT LEADCHNL RV PACING THRESHOLD AMPLITUDE: 1.5 V
MDC IDC MSMT LEADCHNL RV SENSING INTR AMPL: 11.2 mV
MDC IDC STAT BRADY AS VS PERCENT: 0.3 %

## 2014-03-10 ENCOUNTER — Encounter: Payer: Self-pay | Admitting: *Deleted

## 2014-06-07 ENCOUNTER — Ambulatory Visit (INDEPENDENT_AMBULATORY_CARE_PROVIDER_SITE_OTHER): Payer: Medicare Other | Admitting: *Deleted

## 2014-06-07 DIAGNOSIS — I480 Paroxysmal atrial fibrillation: Secondary | ICD-10-CM

## 2014-06-07 DIAGNOSIS — I4891 Unspecified atrial fibrillation: Secondary | ICD-10-CM

## 2014-06-07 LAB — MDC_IDC_ENUM_SESS_TYPE_REMOTE
Battery Remaining Longevity: 25 mo
Battery Voltage: 2.74 V
Brady Statistic AP VP Percent: 0 %
Brady Statistic AS VP Percent: 0 %
Lead Channel Pacing Threshold Amplitude: 1 V
Lead Channel Pacing Threshold Amplitude: 1.25 V
Lead Channel Pacing Threshold Pulse Width: 0.4 ms
Lead Channel Sensing Intrinsic Amplitude: 11.2 mV
Lead Channel Setting Pacing Amplitude: 2 V
Lead Channel Setting Pacing Amplitude: 2.5 V
Lead Channel Setting Pacing Pulse Width: 0.4 ms
Lead Channel Setting Sensing Sensitivity: 4 mV
MDC IDC MSMT BATTERY IMPEDANCE: 1958 Ohm
MDC IDC MSMT LEADCHNL RA IMPEDANCE VALUE: 495 Ohm
MDC IDC MSMT LEADCHNL RA PACING THRESHOLD PULSEWIDTH: 0.4 ms
MDC IDC MSMT LEADCHNL RV IMPEDANCE VALUE: 1699 Ohm
MDC IDC SESS DTM: 20150629130316
MDC IDC STAT BRADY AP VS PERCENT: 100 %
MDC IDC STAT BRADY AS VS PERCENT: 0 %

## 2014-06-07 NOTE — Progress Notes (Signed)
Remote pacemaker transmission.   

## 2014-06-21 ENCOUNTER — Encounter: Payer: Self-pay | Admitting: Internal Medicine

## 2014-08-30 ENCOUNTER — Ambulatory Visit (INDEPENDENT_AMBULATORY_CARE_PROVIDER_SITE_OTHER): Payer: Medicare Other | Admitting: Internal Medicine

## 2014-08-30 ENCOUNTER — Encounter: Payer: Self-pay | Admitting: Internal Medicine

## 2014-08-30 VITALS — BP 102/44 | HR 80 | Ht 66.0 in | Wt 248.2 lb

## 2014-08-30 DIAGNOSIS — Z950 Presence of cardiac pacemaker: Secondary | ICD-10-CM

## 2014-08-30 DIAGNOSIS — I480 Paroxysmal atrial fibrillation: Secondary | ICD-10-CM

## 2014-08-30 DIAGNOSIS — I4891 Unspecified atrial fibrillation: Secondary | ICD-10-CM

## 2014-08-30 DIAGNOSIS — I10 Essential (primary) hypertension: Secondary | ICD-10-CM

## 2014-08-30 DIAGNOSIS — I5032 Chronic diastolic (congestive) heart failure: Secondary | ICD-10-CM

## 2014-08-30 LAB — MDC_IDC_ENUM_SESS_TYPE_INCLINIC
Battery Impedance: 2076 Ohm
Battery Voltage: 2.73 V
Brady Statistic AP VS Percent: 100 %
Brady Statistic AS VP Percent: 0 %
Brady Statistic AS VS Percent: 0 %
Date Time Interrogation Session: 20150921135747
Lead Channel Impedance Value: 1424 Ohm
Lead Channel Pacing Threshold Amplitude: 1 V
Lead Channel Pacing Threshold Amplitude: 1 V
Lead Channel Pacing Threshold Pulse Width: 0.4 ms
Lead Channel Pacing Threshold Pulse Width: 0.64 ms
Lead Channel Setting Pacing Amplitude: 2.5 V
Lead Channel Setting Pacing Pulse Width: 0.4 ms
MDC IDC MSMT BATTERY REMAINING LONGEVITY: 23 mo
MDC IDC MSMT LEADCHNL RA IMPEDANCE VALUE: 494 Ohm
MDC IDC MSMT LEADCHNL RV SENSING INTR AMPL: 11.2 mV
MDC IDC SET LEADCHNL RA PACING AMPLITUDE: 2 V
MDC IDC SET LEADCHNL RV SENSING SENSITIVITY: 4 mV
MDC IDC STAT BRADY AP VP PERCENT: 0 %

## 2014-08-30 NOTE — Patient Instructions (Signed)
Your physician recommends that you continue on your current medications as directed. Please refer to the Current Medication list given to you today.  Remote monitoring is used to monitor your Pacemaker of ICD from home. This monitoring reduces the number of office visits required to check your device to one time per year. It allows [place] to keep an eye on the functioning of your device to ensure it is working properly. You are scheduled for a device check from home on 12/01/14. You may send your transmission at any time that day. If you have a wireless device, the transmission will be sent automatically. After your physician reviews your transmission, you will receive a postcard with your next transmission date.  Your physician wants you to follow-up in: 1 year with Dr. Lovena Le.  You will receive a reminder letter in the mail two months in advance. If you don't receive a letter, please call our office to schedule the follow-up appointment.

## 2014-08-30 NOTE — Assessment & Plan Note (Signed)
Her blood pressure today is well controlled. No change in medical therapy.

## 2014-08-30 NOTE — Assessment & Plan Note (Signed)
Her Medtronic dual-chamber pacemaker is working normally. Plan to recheck in several months.

## 2014-08-30 NOTE — Assessment & Plan Note (Signed)
On low-dose amiodarone, she is maintaining sinus rhythm over 99.9% of the time. No change in medications.

## 2014-08-30 NOTE — Progress Notes (Signed)
HPI Traci [name] returns today for followup. She is a pleasant 80 year old woman with chronic diastolic heart failure, atrial fibrillation, morbid obesity, and symptomatic bradycardia, status post pacemaker insertion. In the interim, she has had rare palpitations and mild dyspnea with exertion. She denies syncope, chest pain, or shortness of breath. She has not been hospitalized. She denies fevers or chills. No cough. Last year I asked her to lose weight but she has not. Allergies  Allergen Reactions  . Codeine   . Penicillins   . Shellfish Allergy   . Sulfonamide Derivatives      Current Outpatient Prescriptions  Medication Sig Dispense Refill  . allopurinol (ZYLOPRIM) 300 MG tablet Take 300 mg by mouth daily.      Marland Kitchen amiodarone (PACERONE) 200 MG tablet Take 1 tablet (200 mg total) by mouth daily.  30 tablet  4  . diltiazem (TIAZAC) 180 MG 24 hr capsule Take 180 mg by mouth daily.        Marland Kitchen doxazosin (CARDURA) 2 MG tablet Take 2 mg by mouth at bedtime.        . furosemide (LASIX) 80 MG tablet Take 80 mg by mouth daily.      Marland Kitchen HYDROcodone-acetaminophen (NORCO) 10-325 MG per tablet Take 1 tablet by mouth every 6 (six) hours as needed for severe pain.      . metolazone (ZAROXOLYN) 2.5 MG tablet Take 2.5 mg by mouth daily.       . metoprolol (TOPROL-XL) 100 MG 24 hr tablet Take 100 mg by mouth daily.        Marland Kitchen omeprazole (PRILOSEC) 40 MG capsule Take 40 mg by mouth daily.      . potassium chloride (KLOR-CON) 8 MEQ tablet Take 20 mEq by mouth daily. 6 in the am, 7 at lunch, 7 at night.      . warfarin (COUMADIN) 5 MG tablet Use as directed by the anticoagulation clinic        No current facility-administered medications for this visit.     Past Medical History  Diagnosis Date  . DOE (dyspnea on exertion)   . COPD (chronic obstructive pulmonary disease)   . Anxiety   . CHF (congestive heart failure)   . Diverticulitis of colon   . OSA (obstructive sleep apnea)   . Paroxysmal atrial  fibrillation   . HTN (hypertension)   . Obesity   . Hyperlipidemia   . DM (diabetes mellitus)     ROS:   All systems reviewed and negative except as noted in the HPI.   Past Surgical History  Procedure Laterality Date  . Cardiac catheterization    . Abdominal hysterectomy    . C-spine surgery    . Lower back surgery    . Esophagogastroduodenoscopy       Family History  Problem Relation Age of Onset  . Heart failure Mother   . Cancer Father   . Cancer      several siblings     History   Social History  . Marital Status: Married    Spouse Name: N/A    Number of Children: N/A  . Years of Education: N/A   Occupational History  . Retired from [employer]  . Smoking status: Former Smoker    Quit date: 12/10/1996  . Smokeless tobacco: Not on file     Comment: gerater than 50 pack year history of tobacco-quit in 1998  . Alcohol Use: No  . Drug Use:  No  . Sexual Activity: Not on file   Other Topics Concern  . Not on file   Social History Narrative  . No narrative on file     BP 102/44  Pulse 80  Ht 5\' 6"  (1.676 m)  Wt 248 lb 3.2 oz (112.583 kg)  BMI 40.08 kg/m2  Physical Exam:  obese appearing elderly woman, NAD HEENT: Unremarkable Neck: 6 cm JVD, no thyromegally Back:  No CVA tenderness Lungs:  Clear with no wheezes, rales, or rhonchi. HEART:  Regular rate rhythm, no murmurs, no rubs, no clicks Abd:  Soft, obese, positive bowel sounds, no organomegally, no rebound, no guarding Ext:  2 plus pulses, no edema, no cyanosis, no clubbing Skin:  No rashes no nodules Neuro:  CN II through XII intact, motor grossly intact  DEVICE  Normal device function.  See PaceArt for details.   Assess/Plan:

## 2014-09-10 ENCOUNTER — Encounter: Payer: Self-pay | Admitting: Internal Medicine

## 2014-12-01 ENCOUNTER — Ambulatory Visit (INDEPENDENT_AMBULATORY_CARE_PROVIDER_SITE_OTHER): Payer: Medicare Other | Admitting: *Deleted

## 2014-12-01 ENCOUNTER — Encounter: Payer: Self-pay | Admitting: Internal Medicine

## 2014-12-01 DIAGNOSIS — I480 Paroxysmal atrial fibrillation: Secondary | ICD-10-CM

## 2014-12-01 LAB — MDC_IDC_ENUM_SESS_TYPE_REMOTE
Battery Impedance: 2329 Ohm
Battery Voltage: 2.72 V
Brady Statistic AP VP Percent: 0 %
Brady Statistic AP VS Percent: 100 %
Brady Statistic AS VP Percent: 0 %
Brady Statistic AS VS Percent: 0 %
Date Time Interrogation Session: 20151223150955
Lead Channel Impedance Value: 1510 Ohm
Lead Channel Impedance Value: 501 Ohm
Lead Channel Pacing Threshold Amplitude: 1.375 V
Lead Channel Pacing Threshold Pulse Width: 0.4 ms
Lead Channel Pacing Threshold Pulse Width: 0.4 ms
MDC IDC MSMT BATTERY REMAINING LONGEVITY: 21 mo
MDC IDC MSMT LEADCHNL RA PACING THRESHOLD AMPLITUDE: 1.125 V
MDC IDC MSMT LEADCHNL RV SENSING INTR AMPL: 11.2 mV
MDC IDC SET LEADCHNL RA PACING AMPLITUDE: 2.25 V
MDC IDC SET LEADCHNL RV PACING AMPLITUDE: 2.5 V
MDC IDC SET LEADCHNL RV PACING PULSEWIDTH: 0.4 ms
MDC IDC SET LEADCHNL RV SENSING SENSITIVITY: 4 mV

## 2014-12-01 NOTE — Progress Notes (Signed)
Remote pacemaker transmission.   

## 2014-12-07 ENCOUNTER — Encounter: Payer: Self-pay | Admitting: Cardiology

## 2015-01-06 ENCOUNTER — Other Ambulatory Visit: Payer: Self-pay | Admitting: Nurse Practitioner

## 2015-01-06 DIAGNOSIS — N6310 Unspecified lump in the right breast, unspecified quadrant: Secondary | ICD-10-CM

## 2015-01-17 ENCOUNTER — Other Ambulatory Visit: Payer: Self-pay | Admitting: Nurse Practitioner

## 2015-01-17 DIAGNOSIS — N6310 Unspecified lump in the right breast, unspecified quadrant: Secondary | ICD-10-CM

## 2015-01-18 ENCOUNTER — Ambulatory Visit
Admission: RE | Admit: 2015-01-18 | Discharge: 2015-01-18 | Disposition: A | Discharge status: Home / Self Care | Payer: Medicare Other | Source: Ambulatory Visit | Attending: Nurse Practitioner | Admitting: Nurse Practitioner

## 2015-01-18 DIAGNOSIS — N6310 Unspecified lump in the right breast, unspecified quadrant: Secondary | ICD-10-CM

## 2015-01-26 ENCOUNTER — Telehealth: Payer: Self-pay | Admitting: Internal Medicine

## 2015-01-26 NOTE — Telephone Encounter (Signed)
Received request from Nurse fax box, documents faxed for surgical clearance. To: [facility] Fax number: [phone] Attention: 2.17.16/km

## 2015-01-31 ENCOUNTER — Telehealth: Payer: Self-pay | Admitting: Internal Medicine

## 2015-01-31 ENCOUNTER — Other Ambulatory Visit (INDEPENDENT_AMBULATORY_CARE_PROVIDER_SITE_OTHER): Payer: Self-pay | Admitting: General Surgery

## 2015-01-31 DIAGNOSIS — C50911 Malignant neoplasm of unspecified site of right female breast: Secondary | ICD-10-CM

## 2015-01-31 NOTE — Telephone Encounter (Signed)
Will forward to Dr. Lenard Simmer- pharm D.

## 2015-01-31 NOTE — Telephone Encounter (Signed)
New message      Request for surgical clearance:  1. What type of surgery is being performed? Lump in breast  2. When is this surgery scheduled? 02-08-15  Are there any medications that need to be held prior to surgery and how long? Pt is on warfarin----she needs lovenox injections 3. Name of physician performing surgery? Dr Barry Dienes  4. What is your office phone and fax number? Phone-[phone]

## 2015-02-01 ENCOUNTER — Telehealth: Payer: Self-pay | Admitting: *Deleted

## 2015-02-01 NOTE — Telephone Encounter (Signed)
Spoke with pt. She states she has had multiple blood clots in her legs and her arm.   Given this information, pt will need Lovenox bridging.  Pt states her Coumadin is managed by  Vickii Chafe- the nurse practitioner at Dr. Jodene Nam office.  Spoke with [employer].  She will reach out to pt and take care of the Lovenox bridge.  Information sent to Dr. Marlowe Aschoff office.

## 2015-02-01 NOTE — Telephone Encounter (Signed)
Follow up      Calling to check the status on starting lovenox injections.  Pt is supposed to start the shots on thurs and she does not want to postpone the surgery.  Please call and give her an update

## 2015-02-01 NOTE — Telephone Encounter (Signed)
Received referral from [facility].  Called and left a message for the pt to return my call so I can schedule a med onc appt.  

## 2015-02-01 NOTE — Telephone Encounter (Signed)
Okay to have biopsy per Dr Lovena Le.  No history of stoke, will not need lovenox injections.  Will forward to Elberta Leatherwood , Pharm D to review.  I have spoken with the patient

## 2015-02-02 ENCOUNTER — Telehealth: Payer: Self-pay | Admitting: Internal Medicine

## 2015-02-02 ENCOUNTER — Encounter (HOSPITAL_BASED_OUTPATIENT_CLINIC_OR_DEPARTMENT_OTHER): Payer: Self-pay | Admitting: *Deleted

## 2015-02-02 ENCOUNTER — Telehealth: Payer: Self-pay | Admitting: *Deleted

## 2015-02-02 NOTE — Telephone Encounter (Signed)
Discussed with Gay Filler and Vickii Chafe Dr Freddie Apley ofice is going to call the patient and arrange

## 2015-02-02 NOTE — Telephone Encounter (Signed)
New Message     Patient is calling regards shots that she has to take and she needs to know when to stop taking them before her surgery on 02/08/15. Please give patient a call.

## 2015-02-02 NOTE — Telephone Encounter (Signed)
Pt returned my call and I confirmed 02/17/15 appt w/ her. Mailed before appt letter, calendar, welcoming packet & intake form to pt.  Emailed Engineer, civil (consulting) at [employer] to make her aware.  Added to spreadsheet.  Placed a copy of the note in Dr. Geralyn Flash box and took a copy to HIM to scan.

## 2015-02-02 NOTE — Progress Notes (Signed)
Pt pretty sharp-went over meds-wrote down instructions and meds to take-she has a cpap, but cannot use-to bring all meds and overnight bag just in case she has to stay-pacer form to dr taplor-she is supposed to get instructions on lovenox tomorrow. Surgery under [facility]

## 2015-02-07 ENCOUNTER — Ambulatory Visit
Admission: RE | Admit: 2015-02-07 | Discharge: 2015-02-07 | Disposition: A | Discharge status: Home / Self Care | Payer: Medicare Other | Source: Ambulatory Visit | Attending: General Surgery | Admitting: General Surgery

## 2015-02-07 ENCOUNTER — Encounter (HOSPITAL_BASED_OUTPATIENT_CLINIC_OR_DEPARTMENT_OTHER)
Admission: RE | Admit: 2015-02-07 | Discharge: 2015-02-07 | Disposition: A | Discharge status: Home / Self Care | Payer: Medicare Other | Source: Ambulatory Visit | Attending: General Surgery | Admitting: General Surgery

## 2015-02-07 DIAGNOSIS — Z86718 Personal history of other venous thrombosis and embolism: Secondary | ICD-10-CM | POA: Diagnosis not present

## 2015-02-07 DIAGNOSIS — E785 Hyperlipidemia, unspecified: Secondary | ICD-10-CM | POA: Diagnosis not present

## 2015-02-07 DIAGNOSIS — E669 Obesity, unspecified: Secondary | ICD-10-CM | POA: Diagnosis not present

## 2015-02-07 DIAGNOSIS — M1990 Unspecified osteoarthritis, unspecified site: Secondary | ICD-10-CM | POA: Diagnosis not present

## 2015-02-07 DIAGNOSIS — Z79891 Long term (current) use of opiate analgesic: Secondary | ICD-10-CM | POA: Diagnosis not present

## 2015-02-07 DIAGNOSIS — E119 Type 2 diabetes mellitus without complications: Secondary | ICD-10-CM | POA: Diagnosis not present

## 2015-02-07 DIAGNOSIS — Z170 Estrogen receptor positive status [ER+]: Secondary | ICD-10-CM | POA: Diagnosis not present

## 2015-02-07 DIAGNOSIS — K219 Gastro-esophageal reflux disease without esophagitis: Secondary | ICD-10-CM | POA: Diagnosis not present

## 2015-02-07 DIAGNOSIS — Z6839 Body mass index (BMI) 39.0-39.9, adult: Secondary | ICD-10-CM | POA: Diagnosis not present

## 2015-02-07 DIAGNOSIS — D0511 Intraductal carcinoma in situ of right breast: Secondary | ICD-10-CM | POA: Diagnosis not present

## 2015-02-07 DIAGNOSIS — Z7901 Long term (current) use of anticoagulants: Secondary | ICD-10-CM | POA: Diagnosis not present

## 2015-02-07 DIAGNOSIS — G4733 Obstructive sleep apnea (adult) (pediatric): Secondary | ICD-10-CM | POA: Diagnosis not present

## 2015-02-07 DIAGNOSIS — I129 Hypertensive chronic kidney disease with stage 1 through stage 4 chronic kidney disease, or unspecified chronic kidney disease: Secondary | ICD-10-CM | POA: Diagnosis not present

## 2015-02-07 DIAGNOSIS — Z87891 Personal history of nicotine dependence: Secondary | ICD-10-CM | POA: Diagnosis not present

## 2015-02-07 DIAGNOSIS — I480 Paroxysmal atrial fibrillation: Secondary | ICD-10-CM | POA: Diagnosis not present

## 2015-02-07 DIAGNOSIS — C50911 Malignant neoplasm of unspecified site of right female breast: Secondary | ICD-10-CM

## 2015-02-07 DIAGNOSIS — I509 Heart failure, unspecified: Secondary | ICD-10-CM | POA: Diagnosis not present

## 2015-02-07 DIAGNOSIS — J449 Chronic obstructive pulmonary disease, unspecified: Secondary | ICD-10-CM | POA: Diagnosis not present

## 2015-02-07 DIAGNOSIS — N189 Chronic kidney disease, unspecified: Secondary | ICD-10-CM | POA: Diagnosis not present

## 2015-02-07 DIAGNOSIS — Z950 Presence of cardiac pacemaker: Secondary | ICD-10-CM | POA: Diagnosis not present

## 2015-02-07 LAB — BASIC METABOLIC PANEL
Anion gap: 17 — ABNORMAL HIGH (ref 5–15)
BUN: 28 mg/dL — ABNORMAL HIGH (ref 6–23)
CO2: 26 mmol/L (ref 19–32)
Calcium: 9.2 mg/dL (ref 8.4–10.5)
Chloride: 96 mmol/L (ref 96–112)
Creatinine, Ser: 1.24 mg/dL — ABNORMAL HIGH (ref 0.50–1.10)
GFR calc Af Amer: 46 mL/min — ABNORMAL LOW (ref >90)
GFR calc non Af Amer: 40 mL/min — ABNORMAL LOW (ref >90)
Glucose, Bld: 113 mg/dL — ABNORMAL HIGH (ref 70–99)
Potassium: 3.5 mmol/L (ref 3.5–5.1)
Sodium: 139 mmol/L (ref 135–145)

## 2015-02-07 LAB — APTT: aPTT: 37 seconds (ref 24–37)

## 2015-02-07 LAB — PROTIME-INR
INR: 1.53 — AB (ref 0.00–1.49)
Prothrombin Time: 18.5 seconds — ABNORMAL HIGH (ref 11.6–15.2)

## 2015-02-07 NOTE — Progress Notes (Signed)
Did review with dr Dian Situ since not pace dependent

## 2015-02-07 NOTE — Progress Notes (Signed)
Spoke with Dr. Barry Dienes on the phone about pt's BUN, Creatinine, PT and INR . Dr. Barry Dienes pulled up lab results and looked at them as well ---- felt labs [facility] and pt may have surgery.

## 2015-02-08 ENCOUNTER — Ambulatory Visit (HOSPITAL_BASED_OUTPATIENT_CLINIC_OR_DEPARTMENT_OTHER)
Admission: RE | Admit: 2015-02-08 | Discharge: 2015-02-08 | Disposition: A | Discharge status: Home / Self Care | Payer: Medicare Other | Source: Ambulatory Visit | Attending: General Surgery | Admitting: General Surgery

## 2015-02-08 ENCOUNTER — Ambulatory Visit (HOSPITAL_BASED_OUTPATIENT_CLINIC_OR_DEPARTMENT_OTHER): Payer: Medicare Other | Admitting: Anesthesiology

## 2015-02-08 ENCOUNTER — Encounter (HOSPITAL_BASED_OUTPATIENT_CLINIC_OR_DEPARTMENT_OTHER): Payer: Self-pay | Admitting: Anesthesiology

## 2015-02-08 ENCOUNTER — Encounter (HOSPITAL_BASED_OUTPATIENT_CLINIC_OR_DEPARTMENT_OTHER)
Admission: RE | Disposition: A | Discharge status: Home / Self Care | Payer: Self-pay | Source: Ambulatory Visit | Attending: General Surgery

## 2015-02-08 ENCOUNTER — Ambulatory Visit
Admission: RE | Admit: 2015-02-08 | Discharge: 2015-02-08 | Disposition: A | Discharge status: Home / Self Care | Payer: Medicare Other | Source: Ambulatory Visit | Attending: General Surgery | Admitting: General Surgery

## 2015-02-08 DIAGNOSIS — K219 Gastro-esophageal reflux disease without esophagitis: Secondary | ICD-10-CM | POA: Insufficient documentation

## 2015-02-08 DIAGNOSIS — M1990 Unspecified osteoarthritis, unspecified site: Secondary | ICD-10-CM | POA: Insufficient documentation

## 2015-02-08 DIAGNOSIS — G4733 Obstructive sleep apnea (adult) (pediatric): Secondary | ICD-10-CM | POA: Insufficient documentation

## 2015-02-08 DIAGNOSIS — E785 Hyperlipidemia, unspecified: Secondary | ICD-10-CM | POA: Insufficient documentation

## 2015-02-08 DIAGNOSIS — C50911 Malignant neoplasm of unspecified site of right female breast: Secondary | ICD-10-CM

## 2015-02-08 DIAGNOSIS — Z87891 Personal history of nicotine dependence: Secondary | ICD-10-CM | POA: Insufficient documentation

## 2015-02-08 DIAGNOSIS — Z86718 Personal history of other venous thrombosis and embolism: Secondary | ICD-10-CM | POA: Insufficient documentation

## 2015-02-08 DIAGNOSIS — I480 Paroxysmal atrial fibrillation: Secondary | ICD-10-CM | POA: Insufficient documentation

## 2015-02-08 DIAGNOSIS — D0511 Intraductal carcinoma in situ of right breast: Secondary | ICD-10-CM | POA: Diagnosis not present

## 2015-02-08 DIAGNOSIS — N189 Chronic kidney disease, unspecified: Secondary | ICD-10-CM | POA: Insufficient documentation

## 2015-02-08 DIAGNOSIS — E119 Type 2 diabetes mellitus without complications: Secondary | ICD-10-CM | POA: Insufficient documentation

## 2015-02-08 DIAGNOSIS — Z950 Presence of cardiac pacemaker: Secondary | ICD-10-CM | POA: Insufficient documentation

## 2015-02-08 DIAGNOSIS — Z79891 Long term (current) use of opiate analgesic: Secondary | ICD-10-CM | POA: Insufficient documentation

## 2015-02-08 DIAGNOSIS — Z170 Estrogen receptor positive status [ER+]: Secondary | ICD-10-CM | POA: Insufficient documentation

## 2015-02-08 DIAGNOSIS — I129 Hypertensive chronic kidney disease with stage 1 through stage 4 chronic kidney disease, or unspecified chronic kidney disease: Secondary | ICD-10-CM | POA: Insufficient documentation

## 2015-02-08 DIAGNOSIS — J449 Chronic obstructive pulmonary disease, unspecified: Secondary | ICD-10-CM | POA: Insufficient documentation

## 2015-02-08 DIAGNOSIS — E669 Obesity, unspecified: Secondary | ICD-10-CM | POA: Insufficient documentation

## 2015-02-08 DIAGNOSIS — I509 Heart failure, unspecified: Secondary | ICD-10-CM | POA: Insufficient documentation

## 2015-02-08 DIAGNOSIS — Z7901 Long term (current) use of anticoagulants: Secondary | ICD-10-CM | POA: Insufficient documentation

## 2015-02-08 DIAGNOSIS — Z6839 Body mass index (BMI) 39.0-39.9, adult: Secondary | ICD-10-CM | POA: Insufficient documentation

## 2015-02-08 HISTORY — PX: BREAST LUMPECTOMY WITH RADIOACTIVE SEED LOCALIZATION: SHX6424

## 2015-02-08 HISTORY — DX: Unspecified osteoarthritis, unspecified site: M19.90

## 2015-02-08 HISTORY — DX: Gastro-esophageal reflux disease without esophagitis: K21.9

## 2015-02-08 HISTORY — DX: Personal history of other venous thrombosis and embolism: Z86.718

## 2015-02-08 HISTORY — DX: Presence of spectacles and contact lenses: Z97.3

## 2015-02-08 HISTORY — DX: Presence of dental prosthetic device (complete) (partial): Z97.2

## 2015-02-08 HISTORY — DX: Complete loss of teeth, unspecified cause, unspecified class: K08.109

## 2015-02-08 SURGERY — BREAST LUMPECTOMY WITH RADIOACTIVE SEED LOCALIZATION
Anesthesia: General | Site: Breast | Laterality: Right

## 2015-02-08 MED ORDER — PROMETHAZINE HCL 25 MG/ML IJ SOLN: 6.2500 mg | INTRAMUSCULAR | Status: DC | PRN

## 2015-02-08 MED ORDER — HYDROMORPHONE HCL 1 MG/ML IJ SOLN
0.2500 mg | INTRAMUSCULAR | Status: DC | PRN
  Administered 2015-02-08 (×2): 0.5 mg via INTRAVENOUS

## 2015-02-08 MED ORDER — HYDROCODONE-ACETAMINOPHEN 5-325 MG PO TABS: 1.0000 | ORAL_TABLET | ORAL | Status: DC | PRN

## 2015-02-08 MED ORDER — MIDAZOLAM HCL 2 MG/2ML IJ SOLN
INTRAMUSCULAR | Status: AC
  Filled 2015-02-08: qty 2

## 2015-02-08 MED ORDER — SODIUM CHLORIDE 0.9 % IV SOLN: 250.0000 mL | INTRAVENOUS | Status: DC | PRN

## 2015-02-08 MED ORDER — ACETAMINOPHEN 650 MG RE SUPP: 650.0000 mg | RECTAL | Status: DC | PRN

## 2015-02-08 MED ORDER — OXYCODONE HCL 5 MG PO TABS: 5.0000 mg | ORAL_TABLET | ORAL | Status: DC | PRN

## 2015-02-08 MED ORDER — ONDANSETRON HCL 4 MG/2ML IJ SOLN
INTRAMUSCULAR | Status: DC | PRN
  Administered 2015-02-08: 4 mg via INTRAVENOUS

## 2015-02-08 MED ORDER — MIDAZOLAM HCL 2 MG/2ML IJ SOLN: 1.0000 mg | INTRAMUSCULAR | Status: DC | PRN

## 2015-02-08 MED ORDER — LIDOCAINE-EPINEPHRINE (PF) 1 %-1:200000 IJ SOLN
INTRAMUSCULAR | Status: DC | PRN
  Administered 2015-02-08: 10 mL

## 2015-02-08 MED ORDER — CIPROFLOXACIN IN D5W 400 MG/200ML IV SOLN
INTRAVENOUS | Status: DC | PRN
  Administered 2015-02-08: 400 mg via INTRAVENOUS

## 2015-02-08 MED ORDER — HYDROMORPHONE HCL 1 MG/ML IJ SOLN
INTRAMUSCULAR | Status: AC
  Filled 2015-02-08: qty 1

## 2015-02-08 MED ORDER — DEXAMETHASONE SODIUM PHOSPHATE 4 MG/ML IJ SOLN
INTRAMUSCULAR | Status: DC | PRN
  Administered 2015-02-08: 5 mg via INTRAVENOUS

## 2015-02-08 MED ORDER — LIDOCAINE HCL (CARDIAC) 20 MG/ML IV SOLN
INTRAVENOUS | Status: DC | PRN
  Administered 2015-02-08: 50 mg via INTRAVENOUS

## 2015-02-08 MED ORDER — FENTANYL CITRATE 0.05 MG/ML IJ SOLN
INTRAMUSCULAR | Status: AC
Start: 2015-02-08 — End: 2015-02-08
  Filled 2015-02-08: qty 6

## 2015-02-08 MED ORDER — PROPOFOL 10 MG/ML IV BOLUS
INTRAVENOUS | Status: DC | PRN
  Administered 2015-02-08: 150 mg via INTRAVENOUS

## 2015-02-08 MED ORDER — OXYCODONE HCL 5 MG/5ML PO SOLN: 5.0000 mg | Freq: Once | ORAL | Status: AC | PRN

## 2015-02-08 MED ORDER — EPHEDRINE SULFATE 50 MG/ML IJ SOLN
INTRAMUSCULAR | Status: DC | PRN
Start: 2015-02-08 — End: 2015-02-08
  Administered 2015-02-08: 10 mg via INTRAVENOUS

## 2015-02-08 MED ORDER — CIPROFLOXACIN IN D5W 400 MG/200ML IV SOLN
INTRAVENOUS | Status: AC
  Filled 2015-02-08: qty 200

## 2015-02-08 MED ORDER — ACETAMINOPHEN 325 MG PO TABS: 650.0000 mg | ORAL_TABLET | ORAL | Status: DC | PRN

## 2015-02-08 MED ORDER — FENTANYL CITRATE 0.05 MG/ML IJ SOLN: 50.0000 ug | INTRAMUSCULAR | Status: DC | PRN

## 2015-02-08 MED ORDER — LACTATED RINGERS IV SOLN
INTRAVENOUS | Status: DC
  Administered 2015-02-08: 09:00:00 via INTRAVENOUS

## 2015-02-08 MED ORDER — OXYCODONE HCL 5 MG PO TABS
ORAL_TABLET | ORAL | Status: AC
  Filled 2015-02-08: qty 1

## 2015-02-08 MED ORDER — SODIUM CHLORIDE 0.9 % IJ SOLN: 3.0000 mL | INTRAMUSCULAR | Status: DC | PRN

## 2015-02-08 MED ORDER — SODIUM CHLORIDE 0.9 % IJ SOLN: 3.0000 mL | Freq: Two times a day (BID) | INTRAMUSCULAR | Status: DC

## 2015-02-08 MED ORDER — OXYCODONE HCL 5 MG PO TABS
5.0000 mg | ORAL_TABLET | Freq: Once | ORAL | Status: AC | PRN
  Administered 2015-02-08: 5 mg via ORAL

## 2015-02-08 MED ORDER — FENTANYL CITRATE 0.05 MG/ML IJ SOLN
INTRAMUSCULAR | Status: DC | PRN
  Administered 2015-02-08: 25 ug via INTRAVENOUS

## 2015-02-08 SURGICAL SUPPLY — 63 items
APPLIER CLIP 9.375 MED OPEN (MISCELLANEOUS)
APR CLP MED 9.3 20 MLT OPN (MISCELLANEOUS)
BINDER BREAST LRG (GAUZE/BANDAGES/DRESSINGS) IMPLANT
BINDER BREAST MEDIUM (GAUZE/BANDAGES/DRESSINGS) IMPLANT
BINDER BREAST XLRG (GAUZE/BANDAGES/DRESSINGS) ×2 IMPLANT
BINDER BREAST XXLRG (GAUZE/BANDAGES/DRESSINGS) IMPLANT
BLADE HEX COATED 2.75 (ELECTRODE) ×3 IMPLANT
BLADE SURG 10 STRL SS (BLADE) ×3 IMPLANT
BLADE SURG 15 STRL LF DISP TIS (BLADE) ×1 IMPLANT
BLADE SURG 15 STRL SS (BLADE) ×3
CANISTER SUC SOCK COL 7IN (MISCELLANEOUS) IMPLANT
CANISTER SUCT 1200ML W/VALVE (MISCELLANEOUS) ×2 IMPLANT
CHLORAPREP W/TINT 26ML (MISCELLANEOUS) ×3 IMPLANT
CLIP APPLIE 9.375 MED OPEN (MISCELLANEOUS) IMPLANT
CLIP TI LARGE 6 (CLIP) ×3 IMPLANT
CLIP TI MEDIUM 6 (CLIP) IMPLANT
CLOSURE WOUND 1/2 X4 (GAUZE/BANDAGES/DRESSINGS) ×1
COVER BACK TABLE 60X90IN (DRAPES) ×3 IMPLANT
COVER MAYO STAND STRL (DRAPES) ×3 IMPLANT
COVER PROBE W GEL 5X96 (DRAPES) ×3 IMPLANT
DECANTER SPIKE VIAL GLASS SM (MISCELLANEOUS) IMPLANT
DEVICE DUBIN W/COMP PLATE 8390 (MISCELLANEOUS) ×3 IMPLANT
DRAPE LAPAROSCOPIC ABDOMINAL (DRAPES) ×3 IMPLANT
DRAPE UTILITY XL STRL (DRAPES) ×3 IMPLANT
ELECT REM PT RETURN 9FT ADLT (ELECTROSURGICAL) ×3
ELECTRODE REM PT RTRN 9FT ADLT (ELECTROSURGICAL) ×1 IMPLANT
GLOVE BIO SURGEON STRL SZ 6 (GLOVE) ×3 IMPLANT
GLOVE BIOGEL PI IND STRL 6.5 (GLOVE) ×1 IMPLANT
GLOVE BIOGEL PI IND STRL 7.0 (GLOVE) IMPLANT
GLOVE BIOGEL PI IND STRL 8 (GLOVE) IMPLANT
GLOVE BIOGEL PI INDICATOR 6.5 (GLOVE) ×2
GLOVE BIOGEL PI INDICATOR 7.0 (GLOVE) ×2
GLOVE BIOGEL PI INDICATOR 8 (GLOVE) ×2
GLOVE ECLIPSE 6.5 STRL STRAW (GLOVE) ×2 IMPLANT
GLOVE SURG SS PI 8.0 STRL IVOR (GLOVE) ×2 IMPLANT
GOWN STRL REUS W/ TWL LRG LVL3 (GOWN DISPOSABLE) ×1 IMPLANT
GOWN STRL REUS W/TWL 2XL LVL3 (GOWN DISPOSABLE) ×3 IMPLANT
GOWN STRL REUS W/TWL LRG LVL3 (GOWN DISPOSABLE) ×6
KIT MARKER MARGIN INK (KITS) ×3 IMPLANT
LIQUID BAND (GAUZE/BANDAGES/DRESSINGS) ×3 IMPLANT
NDL HYPO 25X1 1.5 SAFETY (NEEDLE) ×1 IMPLANT
NEEDLE HYPO 25X1 1.5 SAFETY (NEEDLE) ×3 IMPLANT
NS IRRIG 1000ML POUR BTL (IV SOLUTION) ×2 IMPLANT
PACK BASIN DAY SURGERY FS (CUSTOM PROCEDURE TRAY) ×3 IMPLANT
PENCIL BUTTON HOLSTER BLD 10FT (ELECTRODE) ×3 IMPLANT
SLEEVE SCD COMPRESS KNEE MED (MISCELLANEOUS) ×3 IMPLANT
SPONGE GAUZE 4X4 12PLY STER LF (GAUZE/BANDAGES/DRESSINGS) ×3 IMPLANT
SPONGE LAP 18X18 X RAY DECT (DISPOSABLE) ×3 IMPLANT
STRIP CLOSURE SKIN 1/2X4 (GAUZE/BANDAGES/DRESSINGS) ×2 IMPLANT
SUT MNCRL AB 4-0 PS2 18 (SUTURE) ×3 IMPLANT
SUT MON AB 5-0 PS2 18 (SUTURE) IMPLANT
SUT SILK 2 0 SH (SUTURE) IMPLANT
SUT VIC AB 2-0 SH 27 (SUTURE) ×3
SUT VIC AB 2-0 SH 27XBRD (SUTURE) ×1 IMPLANT
SUT VIC AB 3-0 SH 27 (SUTURE) ×3
SUT VIC AB 3-0 SH 27X BRD (SUTURE) ×1 IMPLANT
SUT VIC AB 5-0 PS2 18 (SUTURE) IMPLANT
SYR CONTROL 10ML LL (SYRINGE) ×3 IMPLANT
TOWEL OR 17X24 6PK STRL BLUE (TOWEL DISPOSABLE) ×3 IMPLANT
TOWEL OR NON WOVEN STRL DISP B (DISPOSABLE) ×3 IMPLANT
TUBE CONNECTING 20'X1/4 (TUBING) ×1
TUBE CONNECTING 20X1/4 (TUBING) ×1 IMPLANT
YANKAUER SUCT BULB TIP NO VENT (SUCTIONS) ×2 IMPLANT

## 2015-02-08 NOTE — Anesthesia Procedure Notes (Signed)
Procedure Name: LMA Insertion Date/Time: 02/08/2015 10:04 AM Performed by: Melynda Ripple D Pre-anesthesia Checklist: Patient identified, Emergency Drugs available, Suction available and Patient being monitored Patient Re-evaluated:Patient Re-evaluated prior to inductionOxygen Delivery Method: Circle System Utilized Preoxygenation: Pre-oxygenation with 100% oxygen Intubation Type: IV induction Ventilation: Mask ventilation without difficulty LMA: LMA inserted LMA Size: 4.0 Number of attempts: 1 Airway Equipment and Method: Bite block Placement Confirmation: positive ETCO2 Tube secured with: Tape Dental Injury: Teeth and Oropharynx as per pre-operative assessment

## 2015-02-08 NOTE — Op Note (Signed)
Right Breast Radioactive seed localized lumpectomy  Indications: This patient presents with history of right breast cancer, cTis  Pre-operative Diagnosis: See above  Post-operative Diagnosis: same  Surgeon: Stark Klein   Anesthesia: General endotracheal anesthesia  ASA Class: 3  Procedure Details  The patient was seen in the Holding Room. The risks, benefits, complications, treatment options, and expected outcomes were discussed with the patient. The possibilities of bleeding, infection, the need for additional procedures, failure to diagnose a condition, and creating a complication requiring transfusion or operation were discussed with the patient. The patient concurred with the proposed plan, giving informed consent.  The site of surgery properly noted/marked. The patient was taken to Operating Room # 6, identified, and the procedure verified as right Breast seed localized lumpectomy. A Time Out was held and the above information confirmed.  The right breast and chest were prepped and draped in standard fashion. The lumpectomy was performed by creating an transverse incision over the lower outer quadrant of the breast over the previously placed radioactive seed.  Dissection was carried down to around the point of maximum signal intensity. The cautery was used to perform the dissection.  Hemostasis was achieved with cautery. The edges of the cavity were marked with large clips, with one each medial, lateral, inferior and superior, and two clips posteriorly.   The specimen was inked with the margin marker paint kit.    Specimen radiography confirmed inclusion of the mammographic lesion, the clip, and the seed.  The background signal in the breast was zero.  The wound was irrigated and closed with 3-0 vicryl in layers and 4-0 monocryl subcuticular suture.      Sterile dressings were applied. At the end of the operation, all sponge, instrument, and needle counts were correct.  Findings: grossly  clear surgical margins and no adenopathy, anterior margin is skin.    Estimated Blood Loss:  min         Specimens: right breast lumpectomy.           Complications:  None; patient tolerated the procedure well.         Disposition: PACU - hemodynamically stable.         Condition: stable

## 2015-02-08 NOTE — Anesthesia Postprocedure Evaluation (Signed)
Anesthesia Post Note  Patient: Traci [name]  Procedure(s) Performed: Procedure(s) (LRB): BREAST LUMPECTOMY WITH RADIOACTIVE SEED LOCALIZATION (Right)  Anesthesia type: General  Patient location: PACU  Post pain: Pain level controlled and Adequate analgesia  Post assessment: Post-op Vital signs reviewed, Patient's Cardiovascular Status Stable, Respiratory Function Stable, Patent Airway and Pain level controlled  Last Vitals:  Filed Vitals:   02/08/15 1200  BP: 109/48  Pulse: 59  Temp:   Resp: 15    Post vital signs: Reviewed and stable  Level of consciousness: awake, alert  and oriented  Complications: No apparent anesthesia complications

## 2015-02-08 NOTE — Discharge Instructions (Addendum)
DO NOT resume lovenox until tomorrow AM.  Hold coumadin until we get the final pathology which should be in 2-3 business days.       [place] Office Phone Number [phone]  BREAST BIOPSY/ PARTIAL MASTECTOMY: POST OP INSTRUCTIONS  Always review your discharge instruction sheet given to you by the facility where your surgery was performed.  IF YOU HAVE DISABILITY OR FAMILY LEAVE FORMS, YOU MUST BRING THEM TO THE OFFICE FOR PROCESSING.  DO NOT GIVE THEM TO YOUR DOCTOR.  1. A prescription for pain medication may be given to you upon discharge.  Take your pain medication as prescribed, if needed.  If narcotic pain medicine is not needed, then you may take acetaminophen (Tylenol) or ibuprofen (Advil) as needed. 2. Take your usually prescribed medications unless otherwise directed 3. If you need a refill on your pain medication, please contact your pharmacy.  They will contact our office to request authorization.  Prescriptions will not be filled after 5pm or on week-ends. 4. You should eat very light the first 24 hours after surgery, such as soup, crackers, pudding, etc.  Resume your normal diet the day after surgery. 5. Most patients will experience some swelling and bruising in the breast.  Ice packs and a good support bra will help.  Swelling and bruising can take several days to resolve.  6. It is common to experience some constipation if taking pain medication after surgery.  Increasing fluid intake and taking a stool softener will usually help or prevent this problem from occurring.  A mild laxative (Milk of Magnesia or Miralax) should be taken according to package directions if there are no bowel movements after 48 hours. 7. Unless discharge instructions indicate otherwise, you may remove your bandages 48 hours after surgery, and you may shower at that time.  You may have steri-strips (small skin tapes) in place directly over the incision.  These strips should be left on the  skin for 7-10 days.   Any sutures or staples will be removed at the office during your follow-up visit. 8. ACTIVITIES:  You may resume regular daily activities (gradually increasing) beginning the next day.  Wearing a good support bra or sports bra (or the breast binder) minimizes pain and swelling.  You may have sexual intercourse when it is comfortable. a. You may drive when you no longer are taking prescription pain medication, you can comfortably wear a seatbelt, and you can safely maneuver your car and apply brakes. b. RETURN TO WORK:  __________1 week_______________ 9. You should see your doctor in the office for a follow-up appointment approximately two weeks after your surgery.  Your doctors nurse will typically make your follow-up appointment when she calls you with your pathology report.  Expect your pathology report 2-3 business days after your surgery.  You may call to check if you do not hear from [place] after three days.   WHEN TO CALL YOUR DOCTOR: 1. Fever over 101.0 2. Nausea and/or vomiting. 3. Extreme swelling or bruising. 4. Continued bleeding from incision. 5. Increased pain, redness, or drainage from the incision.  The clinic staff is available to answer your questions during regular business hours.  Please dont hesitate to call and ask to speak to one of the nurses for clinical concerns.  If you have a medical emergency, go to the nearest emergency room or call 911.  A surgeon from [facility] is always on call at the hospital.  For further questions, please visit [email or web address]  Post Anesthesia Home Care Instructions  Activity: Get plenty of rest for the remainder of the day. A responsible adult should stay with you for 24 hours following the procedure.  For the next 24 hours, DO NOT: -Drive a car -Paediatric nurse -Drink alcoholic beverages -Take any medication unless instructed by your physician -Make any legal decisions or sign  important papers.  Meals: Start with liquid foods such as gelatin or soup. Progress to regular foods as tolerated. Avoid greasy, spicy, heavy foods. If nausea and/or vomiting occur, drink only clear liquids until the nausea and/or vomiting subsides. Call your physician if vomiting continues.  Special Instructions/Symptoms: Your throat may feel dry or sore from the anesthesia or the breathing tube placed in your throat during surgery. If this causes discomfort, gargle with warm salt water. The discomfort should disappear within 24 hours.

## 2015-02-08 NOTE — Anesthesia Preprocedure Evaluation (Addendum)
Anesthesia Evaluation  Patient identified by MRN, date of birth, ID band Patient awake    Reviewed: Allergy & Precautions, NPO status , Patient's Chart, lab work & pertinent test results, reviewed documented beta blocker date and time   Airway Mallampati: III  TM Distance: >3 FB Neck ROM: Limited    Dental  (+) Dental Advisory Given   Pulmonary sleep apnea , COPDformer smoker,  breath sounds clear to auscultation        Cardiovascular hypertension, Pt. on medications and Pt. on home beta blockers +CHF (Hx diastolic HF) and + DOE + dysrhythmias Atrial Fibrillation + pacemaker (per cadiology sinus brady 99.9% of time, Hx of symptomatic bradycardia.) Rhythm:Regular Rate:Normal     Neuro/Psych Anxiety negative neurological ROS     GI/Hepatic Neg liver ROS, GERD-  ,  Endo/Other  diabetes, Type 2Morbid obesity  Renal/GU CRFRenal disease     Musculoskeletal  (+) Arthritis -,   Abdominal   Peds  Hematology negative hematology ROS (+)   Anesthesia Other Findings   Reproductive/Obstetrics                           Anesthesia Physical Anesthesia Plan  ASA: III  Anesthesia Plan: General   Post-op Pain Management:    Induction: Intravenous  Airway Management Planned: LMA  Additional Equipment:   Intra-op Plan:   Post-operative Plan: Extubation in OR  Informed Consent: I have reviewed the patients History and Physical, chart, labs and discussed the procedure including the risks, benefits and alternatives for the proposed anesthesia with the patient or authorized representative who has indicated his/her understanding and acceptance.   Dental advisory given  Plan Discussed with: CRNA  Anesthesia Plan Comments:        Anesthesia Quick Evaluation

## 2015-02-08 NOTE — H&P (Signed)
Traci [name] is an 81 y.o. female.   Chief Complaint: DCIS HPI:  Pt presented with abnormal mammogram.  Biopsy positive for DCIS.  She presents for resection.    Past Medical History  Diagnosis Date  . DOE (dyspnea on exertion)   . COPD (chronic obstructive pulmonary disease)   . Anxiety   . CHF (congestive heart failure)   . Diverticulitis of colon   . Paroxysmal atrial fibrillation   . HTN (hypertension)   . Obesity   . Hyperlipidemia   . DM (diabetes mellitus)   . Full dentures   . Wears glasses   . OSA (obstructive sleep apnea)     has a cpap-cannot use  . GERD (gastroesophageal reflux disease)   . Arthritis   . History of DVT (deep vein thrombosis)     Past Surgical History  Procedure Laterality Date  . Abdominal hysterectomy    . C-spine surgery  1992    cervical disk  . Lower back surgery  1980  . Esophagogastroduodenoscopy    . Dilation and curettage of uterus    . Cardiac catheterization  2005  . Pacemaker insertion  2007  . Colonoscopy      Family History  Problem Relation Age of Onset  . Heart failure Mother   . Cancer Father   . Cancer      several siblings   Social History:  reports that she quit smoking about 18 years ago. She does not have any smokeless tobacco history on file. She reports that she does not drink alcohol or use illicit drugs.  Allergies:  Allergies  Allergen Reactions  . Codeine   . Penicillins   . Shellfish Allergy   . Sulfonamide Derivatives     Medications Prior to Admission  Medication Sig Dispense Refill  . allopurinol (ZYLOPRIM) 300 MG tablet Take 300 mg by mouth daily.    Marland Kitchen amiodarone (PACERONE) 200 MG tablet Take 1 tablet (200 mg total) by mouth daily. 30 tablet 4  . diltiazem (TIAZAC) 180 MG 24 hr capsule Take 180 mg by mouth daily.      Marland Kitchen doxazosin (CARDURA) 2 MG tablet Take 2 mg by mouth at bedtime.      . furosemide (LASIX) 80 MG tablet Take 80 mg by mouth daily.    Marland Kitchen HYDROcodone-acetaminophen (NORCO) 10-325  MG per tablet Take 1 tablet by mouth every 6 (six) hours as needed for severe pain.    . metolazone (ZAROXOLYN) 2.5 MG tablet Take 2.5 mg by mouth daily.     . metoprolol (TOPROL-XL) 100 MG 24 hr tablet Take 100 mg by mouth daily.      Marland Kitchen omeprazole (PRILOSEC) 40 MG capsule Take 40 mg by mouth daily.    . potassium chloride (KLOR-CON) 8 MEQ tablet Take 20 mEq by mouth daily. 6 in the am, 7 at lunch, 7 at night.    . warfarin (COUMADIN) 5 MG tablet Use as directed by the anticoagulation clinic       Results for orders placed or performed during the hospital encounter of 02/08/15 (from the past 48 hour(s))  Basic metabolic panel     Status: Abnormal   Collection Time: 02/07/15 11:15 AM  Result Value Ref Range   Sodium 139 135 - 145 mmol/L   Potassium 3.5 3.5 - 5.1 mmol/L   Chloride 96 96 - 112 mmol/L   CO2 26 19 - 32 mmol/L   Glucose, Bld 113 (H) 70 - 99 mg/dL  BUN 28 (H) 6 - 23 mg/dL   Creatinine, Ser 1.24 (H) 0.50 - 1.10 mg/dL   Calcium 9.2 8.4 - 10.5 mg/dL   GFR calc non Af Amer 40 (L) >90 mL/min   GFR calc Af Amer 46 (L) >90 mL/min    Comment: (NOTE) The eGFR has been calculated using the CKD EPI equation. This calculation has not been validated in all clinical situations. eGFR's persistently <90 mL/min signify possible Chronic Kidney Disease.    Anion gap 17 (H) 5 - 15  Protime-INR     Status: Abnormal   Collection Time: 02/07/15 11:15 AM  Result Value Ref Range   Prothrombin Time 18.5 (H) 11.6 - 15.2 seconds   INR 1.53 (H) 0.00 - 1.49  PTT     Status: None   Collection Time: 02/07/15 11:15 AM  Result Value Ref Range   aPTT 37 24 - 37 seconds    Comment:        IF BASELINE aPTT IS ELEVATED, SUGGEST PATIENT RISK ASSESSMENT BE USED TO DETERMINE APPROPRIATE ANTICOAGULANT THERAPY.    Mm Rt Radioactive Seed Loc Mammo Guide  02/07/2015   CLINICAL DATA:  Patient with recent diagnosis right breast DCIS.  EXAM: MAMMOGRAPHIC GUIDED RADIOACTIVE SEED LOCALIZATION OF THE RIGHT  BREAST  COMPARISON:  Previous exam(s).  FINDINGS: Patient presents for radioactive seed localization prior to right breast lumpectomy. I met with the patient and we discussed the procedure of seed localization including benefits and alternatives. We discussed the high likelihood of a successful procedure. We discussed the risks of the procedure including infection, bleeding, tissue injury and further surgery. We discussed the low dose of radioactivity involved in the procedure. Informed, written consent was given.  The usual time-out protocol was performed immediately prior to the procedure.  Using mammographic guidance, sterile technique, 2% lidocaine and an I-125 radioactive seed, biopsy marking clip within the lower outer right breast was localized using a lateral approach. The follow-up mammogram images confirm the seed in the expected location and are marked for Dr. Barry Dienes.  Follow-up survey of the patient confirms presence of the radioactive seed.  Order number of I-125 seed:  [id number].  Total activity:  0.245 mCi  Reference Date: 11/16/2014  The patient tolerated the procedure well and was released from the [facility]. She was given instructions regarding seed removal.  IMPRESSION: Radioactive seed localization right breast. No apparent complications.   Electronically Signed   By: Lovey Newcomer M.D.   On: 02/07/2015 10:51    Review of Systems  All other systems reviewed and are negative.   Blood pressure 124/62, pulse 72, temperature 97.9 F (36.6 C), temperature source Oral, resp. rate 20, height _0  (1.676 m), weight 244 lb (110.678 kg), SpO2 97 %. Physical Exam  Constitutional: She is oriented to person, place, and time. She appears well-developed and well-nourished. No distress.  HENT:  Head: Normocephalic and atraumatic.  Eyes: Conjunctivae are normal. Pupils are equal, round, and reactive to light. No scleral icterus.  Neck: Normal range of motion.  Respiratory: Effort normal. No  respiratory distress.  No palpable breast mass   GI: Soft.  Neurological: She is alert and oriented to person, place, and time.  Psychiatric: She has a normal mood and affect. Her behavior is normal. Judgment and thought content normal.     Assessment/Plan Right breast cancer, cTis Plan seed localized lumpectomy.   Risks discussed with patient.   She did take lovenox this am.  Latoria Dry 02/08/2015, 9:31 AM

## 2015-02-08 NOTE — Transfer of Care (Signed)
Immediate Anesthesia Transfer of Care Note  Patient: Traci [name]  Procedure(s) Performed: Procedure(s): BREAST LUMPECTOMY WITH RADIOACTIVE SEED LOCALIZATION (Right)  Patient Location: PACU  Anesthesia Type:General  Level of Consciousness: awake, alert  and oriented  Airway & Oxygen Therapy: Patient Spontanous Breathing and Patient connected to face mask oxygen  Post-op Assessment: Report given to RN and Post -op Vital signs reviewed and stable  Post vital signs: Reviewed and stable  Last Vitals:  Filed Vitals:   02/08/15 0823  BP: 124/62  Pulse: 72  Temp: 36.6 C  Resp: 20    Complications: No apparent anesthesia complications

## 2015-02-09 ENCOUNTER — Encounter (HOSPITAL_BASED_OUTPATIENT_CLINIC_OR_DEPARTMENT_OTHER): Payer: Self-pay | Admitting: General Surgery

## 2015-02-11 ENCOUNTER — Encounter (HOSPITAL_BASED_OUTPATIENT_CLINIC_OR_DEPARTMENT_OTHER): Payer: Self-pay | Admitting: *Deleted

## 2015-02-11 ENCOUNTER — Other Ambulatory Visit (INDEPENDENT_AMBULATORY_CARE_PROVIDER_SITE_OTHER): Payer: Self-pay | Admitting: General Surgery

## 2015-02-11 NOTE — Progress Notes (Signed)
Pt has not taken Coumadin since before her surgery 02/08/2015, starting lovenox injections today.

## 2015-02-11 NOTE — Progress Notes (Addendum)
Reviewed chart to find Perioperative Prescription for Implanted Cardiac Device Programming form from previous surgery on March 1st 2016- also had medical records look in epic- they could not find form. Called Dr. Tanna Furry office spoke with Maudie Mercury in medical records - she looked for form and texted Claiborne Billings Dr. Tanna Furry Nurse to see if she had it. Kim said to fax new form and she would try to get Dr. Burt Knack to review pt's notes, fill out form and fax back to [place]. Faxed form to [phone].

## 2015-02-14 ENCOUNTER — Ambulatory Visit (HOSPITAL_BASED_OUTPATIENT_CLINIC_OR_DEPARTMENT_OTHER)
Admission: RE | Admit: 2015-02-14 | Discharge: 2015-02-15 | Disposition: A | Discharge status: Home / Self Care | Payer: Medicare Other | Source: Ambulatory Visit | Attending: General Surgery | Admitting: General Surgery

## 2015-02-14 ENCOUNTER — Ambulatory Visit (HOSPITAL_BASED_OUTPATIENT_CLINIC_OR_DEPARTMENT_OTHER): Payer: Medicare Other | Admitting: Anesthesiology

## 2015-02-14 ENCOUNTER — Encounter (HOSPITAL_BASED_OUTPATIENT_CLINIC_OR_DEPARTMENT_OTHER)
Admission: RE | Disposition: A | Discharge status: Home / Self Care | Payer: Self-pay | Source: Ambulatory Visit | Attending: General Surgery

## 2015-02-14 ENCOUNTER — Encounter (HOSPITAL_BASED_OUTPATIENT_CLINIC_OR_DEPARTMENT_OTHER): Payer: Self-pay

## 2015-02-14 DIAGNOSIS — Z886 Allergy status to analgesic agent status: Secondary | ICD-10-CM | POA: Diagnosis not present

## 2015-02-14 DIAGNOSIS — I10 Essential (primary) hypertension: Secondary | ICD-10-CM | POA: Insufficient documentation

## 2015-02-14 DIAGNOSIS — Z91013 Allergy to seafood: Secondary | ICD-10-CM | POA: Diagnosis not present

## 2015-02-14 DIAGNOSIS — Z90710 Acquired absence of both cervix and uterus: Secondary | ICD-10-CM | POA: Insufficient documentation

## 2015-02-14 DIAGNOSIS — D0511 Intraductal carcinoma in situ of right breast: Secondary | ICD-10-CM | POA: Diagnosis not present

## 2015-02-14 DIAGNOSIS — J449 Chronic obstructive pulmonary disease, unspecified: Secondary | ICD-10-CM | POA: Insufficient documentation

## 2015-02-14 DIAGNOSIS — Z7901 Long term (current) use of anticoagulants: Secondary | ICD-10-CM | POA: Insufficient documentation

## 2015-02-14 DIAGNOSIS — Z882 Allergy status to sulfonamides status: Secondary | ICD-10-CM | POA: Diagnosis not present

## 2015-02-14 DIAGNOSIS — Z87891 Personal history of nicotine dependence: Secondary | ICD-10-CM | POA: Insufficient documentation

## 2015-02-14 DIAGNOSIS — I509 Heart failure, unspecified: Secondary | ICD-10-CM | POA: Insufficient documentation

## 2015-02-14 DIAGNOSIS — E785 Hyperlipidemia, unspecified: Secondary | ICD-10-CM | POA: Insufficient documentation

## 2015-02-14 DIAGNOSIS — K219 Gastro-esophageal reflux disease without esophagitis: Secondary | ICD-10-CM | POA: Insufficient documentation

## 2015-02-14 DIAGNOSIS — I480 Paroxysmal atrial fibrillation: Secondary | ICD-10-CM | POA: Insufficient documentation

## 2015-02-14 DIAGNOSIS — Z86718 Personal history of other venous thrombosis and embolism: Secondary | ICD-10-CM | POA: Insufficient documentation

## 2015-02-14 DIAGNOSIS — C50911 Malignant neoplasm of unspecified site of right female breast: Secondary | ICD-10-CM | POA: Diagnosis present

## 2015-02-14 DIAGNOSIS — E119 Type 2 diabetes mellitus without complications: Secondary | ICD-10-CM | POA: Insufficient documentation

## 2015-02-14 DIAGNOSIS — Z950 Presence of cardiac pacemaker: Secondary | ICD-10-CM | POA: Diagnosis not present

## 2015-02-14 DIAGNOSIS — F419 Anxiety disorder, unspecified: Secondary | ICD-10-CM | POA: Insufficient documentation

## 2015-02-14 DIAGNOSIS — E669 Obesity, unspecified: Secondary | ICD-10-CM | POA: Insufficient documentation

## 2015-02-14 DIAGNOSIS — Z6839 Body mass index (BMI) 39.0-39.9, adult: Secondary | ICD-10-CM | POA: Diagnosis not present

## 2015-02-14 DIAGNOSIS — Z880 Allergy status to penicillin: Secondary | ICD-10-CM | POA: Insufficient documentation

## 2015-02-14 DIAGNOSIS — C50411 Malignant neoplasm of upper-outer quadrant of right female breast: Secondary | ICD-10-CM | POA: Diagnosis present

## 2015-02-14 DIAGNOSIS — G4733 Obstructive sleep apnea (adult) (pediatric): Secondary | ICD-10-CM | POA: Insufficient documentation

## 2015-02-14 HISTORY — PX: RE-EXCISION OF BREAST LUMPECTOMY: SHX6048

## 2015-02-14 HISTORY — DX: Presence of cardiac pacemaker: Z95.0

## 2015-02-14 HISTORY — DX: Hypothyroidism, unspecified: E03.9

## 2015-02-14 LAB — GLUCOSE, CAPILLARY: Glucose-Capillary: 116 mg/dL — ABNORMAL HIGH (ref 70–99)

## 2015-02-14 SURGERY — EXCISION, LESION, BREAST
Anesthesia: General | Site: Breast | Laterality: Right

## 2015-02-14 MED ORDER — BUPIVACAINE HCL (PF) 0.25 % IJ SOLN
INTRAMUSCULAR | Status: DC | PRN
  Administered 2015-02-14: 10 mL

## 2015-02-14 MED ORDER — LACTATED RINGERS IV SOLN: INTRAVENOUS | Status: DC

## 2015-02-14 MED ORDER — MIDAZOLAM HCL 2 MG/2ML IJ SOLN: 1.0000 mg | INTRAMUSCULAR | Status: DC | PRN

## 2015-02-14 MED ORDER — FENTANYL CITRATE 0.05 MG/ML IJ SOLN: 50.0000 ug | INTRAMUSCULAR | Status: DC | PRN

## 2015-02-14 MED ORDER — SODIUM CHLORIDE 0.9 % IV SOLN
250.0000 mL | INTRAVENOUS | Status: DC | PRN
  Administered 2015-02-14: 500 mL via INTRAVENOUS

## 2015-02-14 MED ORDER — METOPROLOL SUCCINATE ER 100 MG PO TB24: 100.0000 mg | ORAL_TABLET | Freq: Every day | ORAL | Status: DC

## 2015-02-14 MED ORDER — ONDANSETRON HCL 4 MG/2ML IJ SOLN: 4.0000 mg | Freq: Once | INTRAMUSCULAR | Status: AC | PRN

## 2015-02-14 MED ORDER — MORPHINE SULFATE 2 MG/ML IJ SOLN
1.0000 mg | INTRAMUSCULAR | Status: DC | PRN
  Administered 2015-02-15: 2 mg via INTRAVENOUS
  Filled 2015-02-14: qty 1

## 2015-02-14 MED ORDER — AMIODARONE HCL 200 MG PO TABS: 200.0000 mg | ORAL_TABLET | Freq: Every day | ORAL | Status: DC

## 2015-02-14 MED ORDER — LIDOCAINE HCL (CARDIAC) 20 MG/ML IV SOLN
INTRAVENOUS | Status: DC | PRN
  Administered 2015-02-14: 50 mg via INTRAVENOUS

## 2015-02-14 MED ORDER — MIDAZOLAM HCL 2 MG/ML PO SYRP: 2.0000 mg | ORAL_SOLUTION | Freq: Once | ORAL | Status: AC | PRN

## 2015-02-14 MED ORDER — DILTIAZEM HCL ER BEADS 180 MG PO CP24: 180.0000 mg | ORAL_CAPSULE | Freq: Every day | ORAL | Status: DC

## 2015-02-14 MED ORDER — OXYCODONE HCL 5 MG PO TABS
5.0000 mg | ORAL_TABLET | Freq: Once | ORAL | Status: AC | PRN
  Administered 2015-02-14: 5 mg via ORAL
  Filled 2015-02-14: qty 1

## 2015-02-14 MED ORDER — HYDROCODONE-ACETAMINOPHEN 10-325 MG PO TABS
1.0000 | ORAL_TABLET | Freq: Four times a day (QID) | ORAL | Status: DC | PRN
  Administered 2015-02-14: 1 via ORAL

## 2015-02-14 MED ORDER — LACTATED RINGERS IV SOLN
INTRAVENOUS | Status: DC
  Administered 2015-02-14: 07:00:00 via INTRAVENOUS

## 2015-02-14 MED ORDER — CIPROFLOXACIN IN D5W 400 MG/200ML IV SOLN
400.0000 mg | INTRAVENOUS | Status: AC
  Administered 2015-02-14: 400 mg via INTRAVENOUS

## 2015-02-14 MED ORDER — ACETAMINOPHEN 325 MG PO TABS: 650.0000 mg | ORAL_TABLET | ORAL | Status: DC | PRN

## 2015-02-14 MED ORDER — ONDANSETRON HCL 4 MG PO TABS
4.0000 mg | ORAL_TABLET | Freq: Four times a day (QID) | ORAL | Status: DC | PRN
Start: 2015-02-14 — End: 2015-02-15

## 2015-02-14 MED ORDER — DOXAZOSIN MESYLATE 2 MG PO TABS
2.0000 mg | ORAL_TABLET | Freq: Every morning | ORAL | Status: DC
  Administered 2015-02-14: 2 mg via ORAL

## 2015-02-14 MED ORDER — FENTANYL CITRATE 0.05 MG/ML IJ SOLN
INTRAMUSCULAR | Status: AC
  Filled 2015-02-14: qty 6

## 2015-02-14 MED ORDER — MIDAZOLAM HCL 2 MG/2ML IJ SOLN
INTRAMUSCULAR | Status: AC
  Filled 2015-02-14: qty 2

## 2015-02-14 MED ORDER — MIDAZOLAM HCL 2 MG/2ML IJ SOLN
1.0000 mg | INTRAMUSCULAR | Status: DC | PRN
Start: 2015-02-14 — End: 2015-02-15

## 2015-02-14 MED ORDER — ALLOPURINOL 300 MG PO TABS
300.0000 mg | ORAL_TABLET | Freq: Every day | ORAL | Status: DC
  Administered 2015-02-14: 300 mg via ORAL

## 2015-02-14 MED ORDER — NITROGLYCERIN 0.4 MG SL SUBL: 0.4000 mg | SUBLINGUAL_TABLET | SUBLINGUAL | Status: DC | PRN

## 2015-02-14 MED ORDER — SODIUM CHLORIDE 0.9 % IJ SOLN: 3.0000 mL | INTRAMUSCULAR | Status: DC | PRN

## 2015-02-14 MED ORDER — METOLAZONE 2.5 MG PO TABS: 2.5000 mg | ORAL_TABLET | Freq: Every day | ORAL | Status: DC

## 2015-02-14 MED ORDER — CIPROFLOXACIN IN D5W 400 MG/200ML IV SOLN
400.0000 mg | Freq: Two times a day (BID) | INTRAVENOUS | Status: AC
  Administered 2015-02-14: 400 mg via INTRAVENOUS

## 2015-02-14 MED ORDER — POTASSIUM CHLORIDE ER 10 MEQ PO TBCR
20.0000 meq | EXTENDED_RELEASE_TABLET | Freq: Every day | ORAL | Status: DC
  Administered 2015-02-14: 20 meq via ORAL

## 2015-02-14 MED ORDER — HYDROMORPHONE HCL 1 MG/ML IJ SOLN
0.2500 mg | INTRAMUSCULAR | Status: DC | PRN
  Administered 2015-02-14 (×4): 0.25 mg via INTRAVENOUS

## 2015-02-14 MED ORDER — HYDROCODONE-ACETAMINOPHEN 10-325 MG PO TABS: 1.0000 | ORAL_TABLET | Freq: Four times a day (QID) | ORAL | Status: DC | PRN

## 2015-02-14 MED ORDER — CIPROFLOXACIN IN D5W 400 MG/200ML IV SOLN
INTRAVENOUS | Status: AC
Start: 2015-02-14 — End: 2015-02-14
  Filled 2015-02-14: qty 200

## 2015-02-14 MED ORDER — BUPIVACAINE HCL (PF) 0.25 % IJ SOLN
INTRAMUSCULAR | Status: AC
Start: 2015-02-14 — End: 2015-02-14
  Filled 2015-02-14: qty 30

## 2015-02-14 MED ORDER — MIDAZOLAM HCL 5 MG/5ML IJ SOLN
INTRAMUSCULAR | Status: DC | PRN
  Administered 2015-02-14: 1 mg via INTRAVENOUS

## 2015-02-14 MED ORDER — ONDANSETRON HCL 4 MG/2ML IJ SOLN
INTRAMUSCULAR | Status: DC | PRN
  Administered 2015-02-14: 4 mg via INTRAVENOUS

## 2015-02-14 MED ORDER — CIPROFLOXACIN IN D5W 400 MG/200ML IV SOLN
INTRAVENOUS | Status: AC
  Filled 2015-02-14: qty 200

## 2015-02-14 MED ORDER — ONDANSETRON HCL 4 MG/2ML IJ SOLN: 4.0000 mg | Freq: Four times a day (QID) | INTRAMUSCULAR | Status: DC | PRN

## 2015-02-14 MED ORDER — PANTOPRAZOLE SODIUM 40 MG PO TBEC: 40.0000 mg | DELAYED_RELEASE_TABLET | Freq: Every day | ORAL | Status: DC

## 2015-02-14 MED ORDER — FUROSEMIDE 80 MG PO TABS
80.0000 mg | ORAL_TABLET | Freq: Every day | ORAL | Status: DC
  Administered 2015-02-14: 80 mg via ORAL

## 2015-02-14 MED ORDER — OXYCODONE HCL 5 MG/5ML PO SOLN: 5.0000 mg | Freq: Once | ORAL | Status: AC | PRN

## 2015-02-14 MED ORDER — PROPOFOL 10 MG/ML IV BOLUS
INTRAVENOUS | Status: DC | PRN
  Administered 2015-02-14: 150 mg via INTRAVENOUS

## 2015-02-14 MED ORDER — HYDROCODONE-ACETAMINOPHEN 5-325 MG PO TABS
1.0000 | ORAL_TABLET | ORAL | Status: DC | PRN
  Administered 2015-02-14 – 2015-02-15 (×3): 2 via ORAL
  Filled 2015-02-14 (×3): qty 2

## 2015-02-14 MED ORDER — ENOXAPARIN SODIUM 100 MG/ML ~~LOC~~ SOLN: 100.0000 mg | Freq: Once | SUBCUTANEOUS | Status: DC

## 2015-02-14 MED ORDER — SODIUM CHLORIDE 0.9 % IJ SOLN: 3.0000 mL | Freq: Two times a day (BID) | INTRAMUSCULAR | Status: DC

## 2015-02-14 MED ORDER — ACETAMINOPHEN 650 MG RE SUPP: 650.0000 mg | RECTAL | Status: DC | PRN

## 2015-02-14 MED ORDER — HYDROMORPHONE HCL 1 MG/ML IJ SOLN
INTRAMUSCULAR | Status: AC
  Filled 2015-02-14: qty 1

## 2015-02-14 MED ORDER — FENTANYL CITRATE 0.05 MG/ML IJ SOLN
INTRAMUSCULAR | Status: DC | PRN
  Administered 2015-02-14: 100 ug via INTRAVENOUS

## 2015-02-14 SURGICAL SUPPLY — 53 items
BINDER BREAST XLRG (GAUZE/BANDAGES/DRESSINGS) ×2 IMPLANT
BLADE HEX COATED 2.75 (ELECTRODE) ×3 IMPLANT
BLADE SURG 15 STRL LF DISP TIS (BLADE) ×1 IMPLANT
BLADE SURG 15 STRL SS (BLADE) ×3
CANISTER SUCT 1200ML W/VALVE (MISCELLANEOUS) ×3 IMPLANT
CHLORAPREP W/TINT 26ML (MISCELLANEOUS) ×3 IMPLANT
CLIP TI LARGE 6 (CLIP) IMPLANT
CLOSURE WOUND 1/2 X4 (GAUZE/BANDAGES/DRESSINGS) ×1
COVER BACK TABLE 60X90IN (DRAPES) ×3 IMPLANT
COVER MAYO STAND STRL (DRAPES) ×3 IMPLANT
DECANTER SPIKE VIAL GLASS SM (MISCELLANEOUS) ×2 IMPLANT
DRAPE LAPAROTOMY 100X72 PEDS (DRAPES) ×3 IMPLANT
DRAPE UTILITY XL STRL (DRAPES) ×3 IMPLANT
DRSG PAD ABDOMINAL 8X10 ST (GAUZE/BANDAGES/DRESSINGS) ×2 IMPLANT
ELECT REM PT RETURN 9FT ADLT (ELECTROSURGICAL) ×3
ELECTRODE REM PT RTRN 9FT ADLT (ELECTROSURGICAL) ×1 IMPLANT
GLOVE BIO SURGEON STRL SZ 6 (GLOVE) ×3 IMPLANT
GLOVE BIO SURGEON STRL SZ8 (GLOVE) ×2 IMPLANT
GLOVE BIOGEL PI IND STRL 6.5 (GLOVE) ×1 IMPLANT
GLOVE BIOGEL PI IND STRL 8 (GLOVE) IMPLANT
GLOVE BIOGEL PI INDICATOR 6.5 (GLOVE) ×4
GLOVE BIOGEL PI INDICATOR 8 (GLOVE) ×2
GOWN STRL REUS W/ TWL LRG LVL3 (GOWN DISPOSABLE) ×1 IMPLANT
GOWN STRL REUS W/ TWL XL LVL3 (GOWN DISPOSABLE) IMPLANT
GOWN STRL REUS W/TWL 2XL LVL3 (GOWN DISPOSABLE) ×3 IMPLANT
GOWN STRL REUS W/TWL LRG LVL3 (GOWN DISPOSABLE)
GOWN STRL REUS W/TWL XL LVL3 (GOWN DISPOSABLE) ×3
KIT MARKER MARGIN INK (KITS) ×2 IMPLANT
LIQUID BAND (GAUZE/BANDAGES/DRESSINGS) ×2 IMPLANT
NDL HYPO 25X1 1.5 SAFETY (NEEDLE) ×1 IMPLANT
NEEDLE HYPO 25X1 1.5 SAFETY (NEEDLE) ×3 IMPLANT
NS IRRIG 1000ML POUR BTL (IV SOLUTION) ×3 IMPLANT
PACK BASIN DAY SURGERY FS (CUSTOM PROCEDURE TRAY) ×3 IMPLANT
PENCIL BUTTON HOLSTER BLD 10FT (ELECTRODE) ×3 IMPLANT
SLEEVE SCD COMPRESS KNEE MED (MISCELLANEOUS) ×3 IMPLANT
SPONGE GAUZE 4X4 12PLY STER LF (GAUZE/BANDAGES/DRESSINGS) ×3 IMPLANT
SPONGE GAUZE 4X4 16PLY UNSTER (WOUND CARE) ×6 IMPLANT
SPONGE LAP 18X18 X RAY DECT (DISPOSABLE) ×3 IMPLANT
STAPLER VISISTAT 35W (STAPLE) IMPLANT
STRIP CLOSURE SKIN 1/2X4 (GAUZE/BANDAGES/DRESSINGS) ×2 IMPLANT
SUT MON AB 4-0 PC3 18 (SUTURE) ×3 IMPLANT
SUT SILK 2 0 SH (SUTURE) IMPLANT
SUT VIC AB 3-0 54X BRD REEL (SUTURE) IMPLANT
SUT VIC AB 3-0 BRD 54 (SUTURE)
SUT VIC AB 3-0 SH 27 (SUTURE) ×3
SUT VIC AB 3-0 SH 27X BRD (SUTURE) ×1 IMPLANT
SYR BULB 3OZ (MISCELLANEOUS) ×3 IMPLANT
SYR CONTROL 10ML LL (SYRINGE) ×3 IMPLANT
TOWEL OR 17X24 6PK STRL BLUE (TOWEL DISPOSABLE) ×3 IMPLANT
TOWEL OR NON WOVEN STRL DISP B (DISPOSABLE) ×3 IMPLANT
TUBE CONNECTING 20'X1/4 (TUBING) ×1
TUBE CONNECTING 20X1/4 (TUBING) ×2 IMPLANT
YANKAUER SUCT BULB TIP NO VENT (SUCTIONS) ×3 IMPLANT

## 2015-02-14 NOTE — Anesthesia Postprocedure Evaluation (Signed)
  Anesthesia Post-op Note  Patient: Traci [name]  Procedure(s) Performed: Procedure(s): RE-EXCISION OF RIGHT BREAST LUMPECTOMY (Right)  Patient Location: PACU  Anesthesia Type: General   Level of Consciousness: awake, alert  and oriented  Airway and Oxygen Therapy: Patient Spontanous Breathing  Post-op Pain: moderate  Post-op Assessment: Post-op Vital signs reviewed  Post-op Vital Signs: Reviewed  Last Vitals:  Filed Vitals:   02/14/15 0915  BP: 108/46  Pulse: 59  Temp:   Resp: 13    Complications: No apparent anesthesia complications; pt will be admitted to [facility] for observation and SpO2 monitoring due to untreated Sleep Apnea

## 2015-02-14 NOTE — Op Note (Signed)
Re-excisional Right Breast Lumpectomy   Indications: This patient presents with history of positive inferior margin after partial mastectomy for right breast cancer   Pre-operative Diagnosis: right breast cancer   Post-operative Diagnosis: right breast cancer   Surgeon: Stark Klein   Assistants: n/a   Anesthesia: general anesthesia and Local anesthesia 0.5% bupivacaine, with epinephrine   ASA Class: 3   Procedure Details  The patient was seen in the Holding Room. The risks, benefits, complications, treatment options, and expected outcomes were discussed with the patient. The possibilities of reaction to medication, pulmonary aspiration, bleeding, infection, the need for additional procedures, failure to diagnose a condition, and creating a complication requiring transfusion or operation were discussed with the patient. The patient concurred with the proposed plan, giving informed consent. The site of surgery properly noted/marked. The patient was taken to Operating Room # 4, identified, and the procedure verified as re-excision of right breast cancer.  After induction of anesthesia, the right breast and chest were prepped and draped in standard fashion.  The lumpectomy was performed by reopening the prior incision. Seroma was aspirated. The mastopexy sutures were removed. Additional margins were taken at the inferior border of the partial mastectomy cavity. Dissection was carried down to the pectoral fascia. Orientation sutures were placed in the specimens. Hemostasis was achieved with cautery. The wound was irrigated and closed with a 3-0 Vicryl deep dermal interrupted and a 4-0 Monocryl subcuticular closure in layers.  Sterile dressings were applied. At the end of the operation, all sponge, instrument, and needle counts were correct.   Findings:  grossly clear surgical margins, inferior margin is skin  Estimated Blood Loss: Minimal   Drains: none   Specimens: additional inferior  margins.   Complications: None; patient tolerated the procedure well.   Disposition: PACU - hemodynamically stable.   Condition: stable

## 2015-02-14 NOTE — Discharge Instructions (Signed)
[facility] °Office Phone Number [phone] ° °BREAST BIOPSY/ PARTIAL MASTECTOMY: POST OP INSTRUCTIONS ° °Always review your discharge instruction sheet given to you by the facility where your surgery was performed. ° °IF YOU HAVE DISABILITY OR FAMILY LEAVE FORMS, YOU MUST BRING THEM TO THE OFFICE FOR PROCESSING.  DO NOT GIVE THEM TO YOUR DOCTOR. ° °1. A prescription for pain medication may be given to you upon discharge.  Take your pain medication as prescribed, if needed.  If narcotic pain medicine is not needed, then you may take acetaminophen (Tylenol) or ibuprofen (Advil) as needed. °2. Take your usually prescribed medications unless otherwise directed °3. If you need a refill on your pain medication, please contact your pharmacy.  They will contact our office to request authorization.  Prescriptions will not be filled after 5pm or on week-ends. °4. You should eat very light the first 24 hours after surgery, such as soup, crackers, pudding, etc.  Resume your normal diet the day after surgery. °5. Most patients will experience some swelling and bruising in the breast.  Ice packs and a good support bra will help.  Swelling and bruising can take several days to resolve.  °6. It is common to experience some constipation if taking pain medication after surgery.  Increasing fluid intake and taking a stool softener will usually help or prevent this problem from occurring.  A mild laxative (Milk of Magnesia or Miralax) should be taken according to package directions if there are no bowel movements after 48 hours. °7. Unless discharge instructions indicate otherwise, you may remove your bandages 48 hours after surgery, and you may shower at that time.  You may have steri-strips (small skin tapes) in place directly over the incision.  These strips should be left on the skin for 7-10 days.   Any sutures or staples will be removed at the office during your follow-up visit. °8. ACTIVITIES:  You may resume  regular daily activities (gradually increasing) beginning the next day.  Wearing a good support bra or sports bra (or the breast binder) minimizes pain and swelling.  You may have sexual intercourse when it is comfortable. °a. You may drive when you no longer are taking prescription pain medication, you can comfortably wear a seatbelt, and you can safely maneuver your car and apply brakes. °b. RETURN TO WORK:  __________1 week_______________ °9. You should see your doctor in the office for a follow-up appointment approximately two weeks after your surgery.  Your doctor’s nurse will typically make your follow-up appointment when she calls you with your pathology report.  Expect your pathology report 2-3 business days after your surgery.  You may call to check if you do not hear from us after three days. ° ° °WHEN TO CALL YOUR DOCTOR: °1. Fever over 101.0 °2. Nausea and/or vomiting. °3. Extreme swelling or bruising. °4. Continued bleeding from incision. °5. Increased pain, redness, or drainage from the incision. ° °The clinic staff is available to answer your questions during regular business hours.  Please don’t hesitate to call and ask to speak to one of the nurses for clinical concerns.  If you have a medical emergency, go to the nearest emergency room or call 911.  A surgeon from [facility] is always on call at the hospital. ° °For further questions, please visit [email or web address]  ° °

## 2015-02-14 NOTE — Transfer of Care (Signed)
Immediate Anesthesia Transfer of Care Note  Patient: Traci [name]  Procedure(s) Performed: Procedure(s): RE-EXCISION OF RIGHT BREAST LUMPECTOMY (Right)  Patient Location: PACU  Anesthesia Type:General  Level of Consciousness: awake and patient cooperative  Airway & Oxygen Therapy: Patient Spontanous Breathing and Patient connected to face mask oxygen  Post-op Assessment: Report given to RN and Post -op Vital signs reviewed and stable  Post vital signs: Reviewed and stable  Last Vitals:  Filed Vitals:   02/14/15 0644  BP: 86/67  Pulse: 67  Temp: 37 C  Resp: 20    Complications: No apparent anesthesia complications

## 2015-02-14 NOTE — Interval H&P Note (Signed)
History and Physical Interval Note:  02/14/2015 7:25 AM  Traci [name]  has presented today for surgery, with the diagnosis of RIGHT BREAST CANCER  The various methods of treatment have been discussed with the patient and family. She had a positive margin from lumpectomy before.  After consideration of risks, benefits and other options for treatment, the patient has consented to  Procedure(s): RE-EXCISION OF RIGHT BREAST LUMPECTOMY (Right) as a surgical intervention .  The patient's history has been reviewed, patient examined, no change in status, stable for surgery.  I have reviewed the patient's chart and labs.  Questions were answered to the patient's satisfaction.     Isaah Furry

## 2015-02-14 NOTE — Anesthesia Preprocedure Evaluation (Signed)
Anesthesia Evaluation  Patient identified by MRN, date of birth, ID band Patient awake    Reviewed: Allergy & Precautions, NPO status , Patient's Chart, lab work & pertinent test results  Airway Mallampati: I  TM Distance: >3 FB Neck ROM: Full    Dental  (+) Upper Dentures, Lower Dentures, Dental Advisory Given   Pulmonary sleep apnea (No use of CPAP even though recommended) , former smoker,  breath sounds clear to auscultation        Cardiovascular hypertension, Pt. on medications Rhythm:Regular Rate:Normal     Neuro/Psych    GI/Hepatic GERD-  Medicated and Controlled,  Endo/Other  diabetes, Well Controlled, Type 2Morbid obesity  Renal/GU      Musculoskeletal   Abdominal   Peds  Hematology   Anesthesia Other Findings   Reproductive/Obstetrics                             Anesthesia Physical Anesthesia Plan  ASA: III  Anesthesia Plan: General   Post-op Pain Management:    Induction: Intravenous  Airway Management Planned: LMA  Additional Equipment:   Intra-op Plan:   Post-operative Plan: Extubation in OR  Informed Consent: I have reviewed the patients History and Physical, chart, labs and discussed the procedure including the risks, benefits and alternatives for the proposed anesthesia with the patient or authorized representative who has indicated his/her understanding and acceptance.   Dental advisory given  Plan Discussed with: CRNA, Anesthesiologist and Surgeon  Anesthesia Plan Comments:         Anesthesia Quick Evaluation

## 2015-02-14 NOTE — Anesthesia Procedure Notes (Signed)
Procedure Name: LMA Insertion Performed by: Tawni Millers Pre-anesthesia Checklist: Patient identified, Emergency Drugs available, Suction available and Patient being monitored Patient Re-evaluated:Patient Re-evaluated prior to inductionOxygen Delivery Method: Circle System Utilized Preoxygenation: Pre-oxygenation with 100% oxygen Intubation Type: IV induction Ventilation: Mask ventilation without difficulty LMA: LMA inserted LMA Size: 4.0 Number of attempts: 1 Airway Equipment and Method: Bite block Placement Confirmation: positive ETCO2 Tube secured with: Tape Dental Injury: Teeth and Oropharynx as per pre-operative assessment

## 2015-02-14 NOTE — H&P (View-Only) (Signed)
Traci [name] is an 81 y.o. female.   Chief Complaint: DCIS HPI:  Pt presented with abnormal mammogram.  Biopsy positive for DCIS.  She presents for resection.    Past Medical History  Diagnosis Date  . DOE (dyspnea on exertion)   . COPD (chronic obstructive pulmonary disease)   . Anxiety   . CHF (congestive heart failure)   . Diverticulitis of colon   . Paroxysmal atrial fibrillation   . HTN (hypertension)   . Obesity   . Hyperlipidemia   . DM (diabetes mellitus)   . Full dentures   . Wears glasses   . OSA (obstructive sleep apnea)     has a cpap-cannot use  . GERD (gastroesophageal reflux disease)   . Arthritis   . History of DVT (deep vein thrombosis)     Past Surgical History  Procedure Laterality Date  . Abdominal hysterectomy    . C-spine surgery  1992    cervical disk  . Lower back surgery  1980  . Esophagogastroduodenoscopy    . Dilation and curettage of uterus    . Cardiac catheterization  2005  . Pacemaker insertion  2007  . Colonoscopy      Family History  Problem Relation Age of Onset  . Heart failure Mother   . Cancer Father   . Cancer      several siblings   Social History:  reports that she quit smoking about 18 years ago. She does not have any smokeless tobacco history on file. She reports that she does not drink alcohol or use illicit drugs.  Allergies:  Allergies  Allergen Reactions  . Codeine   . Penicillins   . Shellfish Allergy   . Sulfonamide Derivatives     Medications Prior to Admission  Medication Sig Dispense Refill  . allopurinol (ZYLOPRIM) 300 MG tablet Take 300 mg by mouth daily.    . amiodarone (PACERONE) 200 MG tablet Take 1 tablet (200 mg total) by mouth daily. 30 tablet 4  . diltiazem (TIAZAC) 180 MG 24 hr capsule Take 180 mg by mouth daily.      . doxazosin (CARDURA) 2 MG tablet Take 2 mg by mouth at bedtime.      . furosemide (LASIX) 80 MG tablet Take 80 mg by mouth daily.    . HYDROcodone-acetaminophen (NORCO) 10-325  MG per tablet Take 1 tablet by mouth every 6 (six) hours as needed for severe pain.    . metolazone (ZAROXOLYN) 2.5 MG tablet Take 2.5 mg by mouth daily.     . metoprolol (TOPROL-XL) 100 MG 24 hr tablet Take 100 mg by mouth daily.      . omeprazole (PRILOSEC) 40 MG capsule Take 40 mg by mouth daily.    . potassium chloride (KLOR-CON) 8 MEQ tablet Take 20 mEq by mouth daily. 6 in the am, 7 at lunch, 7 at night.    . warfarin (COUMADIN) 5 MG tablet Use as directed by the anticoagulation clinic       Results for orders placed or performed during the hospital encounter of 02/08/15 (from the past 48 hour(s))  Basic metabolic panel     Status: Abnormal   Collection Time: 02/07/15 11:15 AM  Result Value Ref Range   Sodium 139 135 - 145 mmol/L   Potassium 3.5 3.5 - 5.1 mmol/L   Chloride 96 96 - 112 mmol/L   CO2 26 19 - 32 mmol/L   Glucose, Bld 113 (H) 70 - 99 mg/dL     BUN 28 (H) 6 - 23 mg/dL   Creatinine, Ser 1.24 (H) 0.50 - 1.10 mg/dL   Calcium 9.2 8.4 - 10.5 mg/dL   GFR calc non Af Amer 40 (L) >90 mL/min   GFR calc Af Amer 46 (L) >90 mL/min    Comment: (NOTE) The eGFR has been calculated using the CKD EPI equation. This calculation has not been validated in all clinical situations. eGFR's persistently <90 mL/min signify possible Chronic Kidney Disease.    Anion gap 17 (H) 5 - 15  Protime-INR     Status: Abnormal   Collection Time: 02/07/15 11:15 AM  Result Value Ref Range   Prothrombin Time 18.5 (H) 11.6 - 15.2 seconds   INR 1.53 (H) 0.00 - 1.49  PTT     Status: None   Collection Time: 02/07/15 11:15 AM  Result Value Ref Range   aPTT 37 24 - 37 seconds    Comment:        IF BASELINE aPTT IS ELEVATED, SUGGEST PATIENT RISK ASSESSMENT BE USED TO DETERMINE APPROPRIATE ANTICOAGULANT THERAPY.    Mm Rt Radioactive Seed Loc Mammo Guide  02/07/2015   CLINICAL DATA:  Patient with recent diagnosis right breast DCIS.  EXAM: MAMMOGRAPHIC GUIDED RADIOACTIVE SEED LOCALIZATION OF THE RIGHT  BREAST  COMPARISON:  Previous exam(s).  FINDINGS: Patient presents for radioactive seed localization prior to right breast lumpectomy. I met with the patient and we discussed the procedure of seed localization including benefits and alternatives. We discussed the high likelihood of a successful procedure. We discussed the risks of the procedure including infection, bleeding, tissue injury and further surgery. We discussed the low dose of radioactivity involved in the procedure. Informed, written consent was given.  The usual time-out protocol was performed immediately prior to the procedure.  Using mammographic guidance, sterile technique, 2% lidocaine and an I-125 radioactive seed, biopsy marking clip within the lower outer right breast was localized using a lateral approach. The follow-up mammogram images confirm the seed in the expected location and are marked for Dr. Kalif Kattner.  Follow-up survey of the patient confirms presence of the radioactive seed.  Order number of I-125 seed:  [id number].  Total activity:  0.245 mCi  Reference Date: 11/16/2014  The patient tolerated the procedure well and [facility] She was given instructions regarding seed removal.  IMPRESSION: Radioactive seed localization right breast. No apparent complications.   Electronically Signed   By: Drew  Davis M.D.   On: 02/07/2015 10:51    Review of Systems  All other systems reviewed and are negative.   Blood pressure 124/62, pulse 72, temperature 97.9 F (36.6 C), temperature source Oral, resp. rate 20, height 5' 6" (1.676 m), weight 244 lb (110.678 kg), SpO2 97 %. Physical Exam  Constitutional: She is oriented to person, place, and time. She appears well-developed and well-nourished. No distress.  HENT:  Head: Normocephalic and atraumatic.  Eyes: Conjunctivae are normal. Pupils are equal, round, and reactive to light. No scleral icterus.  Neck: Normal range of motion.  Respiratory: Effort normal. No  respiratory distress.  No palpable breast mass   GI: Soft.  Neurological: She is alert and oriented to person, place, and time.  Psychiatric: She has a normal mood and affect. Her behavior is normal. Judgment and thought content normal.     Assessment/Plan Right breast cancer, cTis Plan seed localized lumpectomy.   Risks discussed with patient.   She did take lovenox this am.        Traci [name] 02/08/2015, 9:31 AM    

## 2015-02-15 ENCOUNTER — Encounter (HOSPITAL_BASED_OUTPATIENT_CLINIC_OR_DEPARTMENT_OTHER): Payer: Self-pay | Admitting: General Surgery

## 2015-02-15 DIAGNOSIS — D0511 Intraductal carcinoma in situ of right breast: Secondary | ICD-10-CM | POA: Diagnosis not present

## 2015-02-15 NOTE — Progress Notes (Signed)
Quick Note:  Please let patient know final margin is negative. ______

## 2015-02-17 ENCOUNTER — Ambulatory Visit: Payer: Medicare Other

## 2015-02-17 ENCOUNTER — Encounter: Payer: Self-pay | Admitting: Hematology and Oncology

## 2015-02-17 ENCOUNTER — Telehealth: Payer: Self-pay | Admitting: Hematology and Oncology

## 2015-02-17 ENCOUNTER — Encounter: Payer: Self-pay | Admitting: *Deleted

## 2015-02-17 ENCOUNTER — Ambulatory Visit (HOSPITAL_BASED_OUTPATIENT_CLINIC_OR_DEPARTMENT_OTHER): Payer: Medicare Other | Admitting: Hematology and Oncology

## 2015-02-17 VITALS — BP 116/54 | HR 73 | Temp 98.5°F | Resp 18 | Ht 66.0 in | Wt 248.9 lb

## 2015-02-17 DIAGNOSIS — Z170 Estrogen receptor positive status [ER+]: Secondary | ICD-10-CM

## 2015-02-17 DIAGNOSIS — D0511 Intraductal carcinoma in situ of right breast: Secondary | ICD-10-CM

## 2015-02-17 DIAGNOSIS — C50411 Malignant neoplasm of upper-outer quadrant of right female breast: Secondary | ICD-10-CM

## 2015-02-17 MED ORDER — ANASTROZOLE 1 MG PO TABS: 1.0000 mg | ORAL_TABLET | Freq: Every day | ORAL | Status: DC

## 2015-02-17 NOTE — Assessment & Plan Note (Addendum)
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 I described the difference between in situ and invasive breast cancer. We discussed the significance of ER and PR receptors and their implications on adjuvant treatment.  Recommendation based on multidisciplinary tumor board: 1. Adjuvant oral antiestrogen therapy with anastrozole 1 mg daily daily for 5 years (because she has H/O Blood clots) 2. The tumor board did not feel that she would benefit from adjuvant radiation therapy because of her advanced age.  Anastrozole counseling: We discussed the risks and benefits of anti-estrogen therapy with aromatase inhibitors. These include but not limited to insomnia, hot flashes, mood changes, vaginal dryness, bone density loss, and weight gain. Although rare, serious side effects including endometrial cancer, risk of blood clots were also discussed. We strongly believe that the benefits far outweigh the risks. Patient understands these risks and consented to starting treatment. Planned treatment duration is 5 years.  Return to clinic in 3 months for follow-up on tamoxifen therapy.

## 2015-02-17 NOTE — Progress Notes (Signed)
[facility] NOTE  Patient Care Team: Raelene Bott, MD as PCP - General (Internal Medicine)  CHIEF COMPLAINTS/PURPOSE OF CONSULTATION:  Newly diagnosed right breast DCIS  HISTORY OF PRESENTING ILLNESS:  Traci [name] 81 y.o. female is here because of recent diagnosis of right breast DCIS. Patient had routine screening mammograms at revealed abnormality that was further evaluated with a diagnostic mammogram and a biopsy in 01/18/2015. The biopsy came back as DCIS with calcifications and it was low-grade with the ER/PR 100% positive. She then underwent lumpectomy on 02/08/2015 which showed positive margins that required reexcision on 02/14/2015 which showed negative margins. She has been sent to [place] for discussion regarding adjuvant treatment options. We discussed the case in the multidisciplinary tumor board. She has recovered fairly well from the surgery.  I reviewed her records extensively and collaborated the history with the patient.  SUMMARY OF ONCOLOGIC HISTORY:   Breast cancer of upper-outer quadrant of right female breast   01/18/2015 Mammogram Right breast: 8 mm mass at 8:00 position   01/18/2015 Initial Biopsy Right breast biopsy: DCIS with calcifications, low-grade, ER 100%, PR 100%   02/08/2015 Surgery Right breast lumpectomy, positive margins, reexcision 02/14/2015 negative margins   MEDICAL HISTORY:  Past Medical History  Diagnosis Date  . DOE (dyspnea on exertion)   . COPD (chronic obstructive pulmonary disease)   . Anxiety   . CHF (congestive heart failure)   . Diverticulitis of colon   . Paroxysmal atrial fibrillation   . HTN (hypertension)   . Obesity   . Hyperlipidemia   . DM (diabetes mellitus)   . Full dentures   . Wears glasses   . OSA (obstructive sleep apnea)     has a cpap-cannot use  . GERD (gastroesophageal reflux disease)   . Arthritis   . History of DVT (deep vein thrombosis)   . Presence of permanent cardiac pacemaker   .  Hypothyroidism   . Cancer     right breast cancer    SURGICAL HISTORY: Past Surgical History  Procedure Laterality Date  . Abdominal hysterectomy    . C-spine surgery  1992    cervical disk  . Lower back surgery  1980  . Esophagogastroduodenoscopy    . Dilation and curettage of uterus    . Cardiac catheterization  2005  . Pacemaker insertion  2007  . Colonoscopy    . Breast lumpectomy with radioactive seed localization Right 02/08/2015    Procedure: BREAST LUMPECTOMY WITH RADIOACTIVE SEED LOCALIZATION;  Surgeon: Stark Klein, MD;  Location: [facility];  Service: General;  Laterality: Right;  . Re-excision of breast lumpectomy Right 02/14/2015    Procedure: RE-EXCISION OF RIGHT BREAST LUMPECTOMY;  Surgeon: Stark Klein, MD;  Location: [facility];  Service: General;  Laterality: Right;    SOCIAL HISTORY: History   Social History  . Marital Status: Married    Spouse Name: N/A  . Number of Children: N/A  . Years of Education: N/A   Occupational History  . Retired from [employer]  . Smoking status: Former Smoker    Quit date: 12/10/1996  . Smokeless tobacco: Not on file     Comment: gerater than 50 pack year history of tobacco-quit in 1998  . Alcohol Use: No  . Drug Use: No  . Sexual Activity: Not on file   Other Topics Concern  . Not on file   Social History Narrative  FAMILY HISTORY: Family History  Problem Relation Age of Onset  . Heart failure Mother   . Cancer Father   . Cancer      several siblings    ALLERGIES:  is allergic to codeine; penicillins; shellfish allergy; and sulfonamide derivatives.  MEDICATIONS:  Current Outpatient Prescriptions  Medication Sig Dispense Refill  . allopurinol (ZYLOPRIM) 300 MG tablet Take 300 mg by mouth daily.    Marland Kitchen amiodarone (PACERONE) 200 MG tablet Take 1 tablet (200 mg total) by mouth daily. 30 tablet 4  . Biotin 1000 MCG tablet Take 1,000 mcg by mouth as  needed.    . diltiazem (TIAZAC) 180 MG 24 hr capsule Take 180 mg by mouth daily.      Marland Kitchen doxazosin (CARDURA) 2 MG tablet Take 2 mg by mouth every morning.     . enoxaparin (LOVENOX) 100 MG/ML injection Inject 100 mg into the skin.    . furosemide (LASIX) 80 MG tablet Take 80 mg by mouth daily.    Marland Kitchen HYDROcodone-acetaminophen (NORCO) 10-325 MG per tablet Take 1 tablet by mouth every 6 (six) hours as needed for severe pain. 20 tablet 0  . metolazone (ZAROXOLYN) 2.5 MG tablet Take 2.5 mg by mouth daily.     . metoprolol (TOPROL-XL) 100 MG 24 hr tablet Take 100 mg by mouth daily.      . nitroGLYCERIN (NITROSTAT) 0.4 MG SL tablet Place 0.4 mg under the tongue every 5 (five) minutes as needed for chest pain.    Marland Kitchen omeprazole (PRILOSEC) 40 MG capsule Take 40 mg by mouth daily.    . potassium chloride (KLOR-CON) 8 MEQ tablet Take 20 mEq by mouth daily. 6 in the am, 7 at lunch, 7 at night.    . warfarin (COUMADIN) 5 MG tablet Use as directed by the anticoagulation clinic      No current facility-administered medications for this visit.    REVIEW OF SYSTEMS:   Constitutional: Denies fevers, chills or abnormal night sweats Eyes: Denies blurriness of vision, double vision or watery eyes Ears, nose, mouth, throat, and face: Denies mucositis or sore throat Respiratory: Denies cough, dyspnea or wheezes Cardiovascular: Denies palpitation, chest discomfort or lower extremity swelling Gastrointestinal:  Denies nausea, heartburn or change in bowel habits Skin: Denies abnormal skin rashes Lymphatics: Denies new lymphadenopathy or easy bruising Neurological:Denies numbness, tingling or new weaknesses Behavioral/Psych: Mood is stable, no new changes  Breast: Recovering well from surgery slightly sore All other systems were reviewed with the patient and are negative.  PHYSICAL EXAMINATION: ECOG PERFORMANCE STATUS: 1 - Symptomatic but completely ambulatory  Filed Vitals:   02/17/15 1255  BP: 116/54  Pulse:  73  Temp: 98.5 F (36.9 C)  Resp: 18   Filed Weights   02/17/15 1255  Weight: 248 lb 14.4 oz (112.9 kg)    GENERAL:alert, no distress and comfortable SKIN: skin color, texture, turgor are normal, no rashes or significant lesions EYES: normal, conjunctiva are pink and non-injected, sclera clear OROPHARYNX:no exudate, no erythema and lips, buccal mucosa, and tongue normal  NECK: supple, thyroid normal size, non-tender, without nodularity LYMPH:  no palpable lymphadenopathy in the cervical, axillary or inguinal LUNGS: clear to auscultation and percussion with normal breathing effort HEART: regular rate & rhythm and no murmurs and no lower extremity edema ABDOMEN:abdomen soft, non-tender and normal bowel sounds Musculoskeletal:no cyanosis of digits and no clubbing  PSYCH: alert & oriented x 3 with fluent speech NEURO: no focal motor/sensory deficits LABORATORY DATA:  I have reviewed  the data as listed No results found for: WBC, HGB, HCT, MCV, PLT Lab Results  Component Value Date   NA 139 02/07/2015   K 3.5 02/07/2015   CL 96 02/07/2015   CO2 26 02/07/2015    RADIOGRAPHIC STUDIES: I have personally reviewed the radiological reports and agreed with the findings in the report. Results are summarized as above  ASSESSMENT AND PLAN:  Breast cancer of upper-outer quadrant of right female breast Right breast low-grade DCIS 8 mm in size status post lumpectomy with focal inferior margin positive requiring reexcision 02/15/2015 ER 100% PR 100% positive  Pathology radiology review: I discussed the details of the radiology images as well as a pathology report in great detail. I described the difference between in situ and invasive breast cancer. We discussed the significance of ER and PR receptors and their implications on adjuvant treatment.  Recommendation based on multidisciplinary tumor board: 1. Adjuvant oral antiestrogen therapy with Anastrozole 1 mg daily for 5 years (reason for  choosing masses old was because of her prior history of blood clots and phlebitis; patient currently takes Coumadin for atrial fibrillation) 2. The tumor board did not feel that she would benefit from adjuvant radiation therapy because of her advanced age.  Anastrozole counseling: We discussed the risks and benefits of anti-estrogen therapy with aromatase inhibitors. These include but not limited to insomnia, hot flashes, mood changes, vaginal dryness, bone density loss, and weight gain. We strongly believe that the benefits far outweigh the risks. Patient understands these risks and consented to starting treatment. Planned treatment duration is 5 years.  Return to clinic in 3 months for follow-up on tamoxifen therapy.  All questions were answered. The p 82 this information the [employer] program 3 months atient knows to call the clinic with any problems, questions or concerns.    Rulon Eisenmenger, MD 1:01 PM

## 2015-02-17 NOTE — Telephone Encounter (Signed)
appts made and avs printed for pt  Traci [name] °

## 2015-02-17 NOTE — Progress Notes (Signed)
Met with pt after new pt appt with Dr. Lindi Adie. Denies questions or concerns regarding dx or treatment care plan. Encourage pt to call with needs. Received verbal understanding.

## 2015-02-17 NOTE — Progress Notes (Signed)
New patient intake form - chart updated and sent to scan.

## 2015-02-21 ENCOUNTER — Telehealth: Payer: Self-pay | Admitting: *Deleted

## 2015-02-21 NOTE — Telephone Encounter (Signed)
Spoke to pt regarding her appt with Dr. Lindi Adie to assess needs or questions. Pt denies concerns regarding treatment care plan. Confirmed 3 mon f/u with Dr. Lindi Adie. Mailed pt care plan summary.

## 2015-03-02 ENCOUNTER — Telehealth: Payer: Self-pay | Admitting: Cardiology

## 2015-03-02 ENCOUNTER — Encounter: Payer: Self-pay | Admitting: Internal Medicine

## 2015-03-02 ENCOUNTER — Ambulatory Visit (INDEPENDENT_AMBULATORY_CARE_PROVIDER_SITE_OTHER): Payer: Medicare Other | Admitting: *Deleted

## 2015-03-02 DIAGNOSIS — I480 Paroxysmal atrial fibrillation: Secondary | ICD-10-CM | POA: Diagnosis not present

## 2015-03-02 NOTE — Progress Notes (Signed)
Remote pacemaker transmission.   

## 2015-03-02 NOTE — Telephone Encounter (Signed)
Spoke with pt and reminded pt of remote transmission that is due today. Pt verbalized understanding.   

## 2015-03-04 LAB — MDC_IDC_ENUM_SESS_TYPE_REMOTE
Brady Statistic AP VS Percent: 100 %
Brady Statistic AS VP Percent: 0 %
Lead Channel Impedance Value: 1654 Ohm
Lead Channel Impedance Value: 496 Ohm
Lead Channel Pacing Threshold Amplitude: 1 V
Lead Channel Pacing Threshold Amplitude: 1.25 V
Lead Channel Pacing Threshold Pulse Width: 0.4 ms
Lead Channel Pacing Threshold Pulse Width: 0.4 ms
Lead Channel Sensing Intrinsic Amplitude: 11.2 mV
Lead Channel Setting Pacing Amplitude: 2 V
Lead Channel Setting Pacing Amplitude: 2.5 V
Lead Channel Setting Pacing Pulse Width: 0.4 ms
Lead Channel Setting Sensing Sensitivity: 4 mV
MDC IDC MSMT BATTERY IMPEDANCE: 2439 Ohm
MDC IDC MSMT BATTERY REMAINING LONGEVITY: 20 mo
MDC IDC MSMT BATTERY VOLTAGE: 2.71 V
MDC IDC SESS DTM: 20160323132310
MDC IDC STAT BRADY AP VP PERCENT: 0 %
MDC IDC STAT BRADY AS VS PERCENT: 0 %

## 2015-03-17 ENCOUNTER — Encounter: Payer: Self-pay | Admitting: Cardiology

## 2015-04-29 ENCOUNTER — Encounter: Payer: Self-pay | Admitting: Adult Health

## 2015-04-29 ENCOUNTER — Telehealth: Payer: Self-pay | Admitting: Adult Health

## 2015-04-29 NOTE — Progress Notes (Signed)
The Survivorship Care Plan was mailed to Ms. Johannsen as she reported not being able to come in to the Survivorship Clinic for an in-person visit at this time. A letter was mailed to her outlining the purpose of the content of the care plan, as well as encouraging her to reach out to me with any questions or concerns.  My business card was included in the correspondence to the patient as well.  A copy of the care plan was also routed/faxed/mailed to [facility], MD, the patient's PCP.  I will not be placing any follow-up appointments to the Survivorship Clinic for Ms. Lung, but I am happy to see her at any time in the future for any survivorship concerns that may arise. Thank you for allowing me to participate in her care!  Mike Craze, NP [facility] [phone]

## 2015-04-29 NOTE — Telephone Encounter (Signed)
I spoke with Ms. Traci [name] to try to schedule her survivorship visit now that she has completed treatment.  Since she lives in [place] and is overall doing quite well, she prefers to have her survivorship care plan mailed to her in lieu of an office visit in the [facility].    I will complete her care plan and mail it to her home per her request. I gave her my direct office number to call me if she has any questions or concerns before her next appointment here at the cancer center.   Mike Craze, NP [facility] [phone]

## 2015-05-31 ENCOUNTER — Telehealth: Payer: Self-pay | Admitting: Hematology and Oncology

## 2015-05-31 ENCOUNTER — Ambulatory Visit (HOSPITAL_BASED_OUTPATIENT_CLINIC_OR_DEPARTMENT_OTHER): Payer: Medicare Other | Admitting: Hematology and Oncology

## 2015-05-31 VITALS — BP 126/63 | HR 83 | Temp 98.4°F | Resp 18 | Ht 68.0 in | Wt 247.4 lb

## 2015-05-31 DIAGNOSIS — Z170 Estrogen receptor positive status [ER+]: Secondary | ICD-10-CM | POA: Diagnosis not present

## 2015-05-31 DIAGNOSIS — Z79810 Long term (current) use of selective estrogen receptor modulators (SERMs): Secondary | ICD-10-CM | POA: Diagnosis not present

## 2015-05-31 DIAGNOSIS — D0511 Intraductal carcinoma in situ of right breast: Secondary | ICD-10-CM

## 2015-05-31 DIAGNOSIS — C50411 Malignant neoplasm of upper-outer quadrant of right female breast: Secondary | ICD-10-CM

## 2015-05-31 NOTE — Telephone Encounter (Signed)
Gave avs & calendar for December. °

## 2015-05-31 NOTE — Assessment & Plan Note (Signed)
Right breast low-grade DCIS 8 mm in size status post lumpectomy with focal inferior margin positive requiring reexcision 02/15/2015 ER 100% PR 100% positive  Current treatment: Anastrozole 1 mg daily for 5 years started 02/17/2015  Anastrozole Toxicities:  Return to clinic in 6 months for follow-up for surveillance breast exams and monitoring of anastrozole toxicities.

## 2015-05-31 NOTE — Progress Notes (Signed)
Patient Care Team: Raelene Bott, MD as PCP - General (Internal Medicine)  DIAGNOSIS: No matching staging information was found for the patient.  SUMMARY OF ONCOLOGIC HISTORY:   Breast cancer of upper-outer quadrant of right female breast   01/18/2015 Mammogram Right breast: 8 mm mass at 8:00 position   01/18/2015 Initial Biopsy Right breast needle core biopsy: DCIS with calcifications, low-grade, ER 100%, PR 100%   02/08/2015 Surgery Right breast lumpectomy Barry Dienes): Grade 2 DCIS, spanning 1 cm. Positive inferior margin.    02/08/2015 Pathologic Stage pTis, pNx: Stage 0   02/14/2015 Surgery Right breat re-excision Barry Dienes): Microscopic focus of DCIS. Negative margins.     Miscellaneous Discussion at breast tumor board conference was that patient would not be a candidate for adjuvant RT given her advanced age.    02/2015 -  Anti-estrogen oral therapy Anastrazole daily (due to h/o blood clots). Planned duration of treatment: 5 years. Lindi Adie)   04/29/2015 Survivorship Survivorship Care Plan mailed to pt per her request. She declined in-person visit.     CHIEF COMPLIANT: Follow-up on anastrozole  INTERVAL HISTORY: Traci [name] is a 81 year old with above-mentioned history of right-sided DCIS currently on oral antiestrogen therapy with anastrozole. She is tolerating it extremely well without any major problems or concerns. She is obese and has some difficulty with walking. She uses a cane otherwise she has fallen before. Her husband assists her with most of the home care. She is planning to do more activity to help strengthen her lower extremities. Denies any lumps or nodules in the breasts.  REVIEW OF SYSTEMS:   Constitutional: Denies fevers, chills or abnormal weight loss Eyes: Denies blurriness of vision Ears, nose, mouth, throat, and face: Denies mucositis or sore throat Respiratory: Denies cough, dyspnea or wheezes Cardiovascular: Denies palpitation, chest discomfort or lower extremity  swelling Gastrointestinal:  Denies nausea, heartburn or change in bowel habits, obese Skin: Denies abnormal skin rashes Lymphatics: Denies new lymphadenopathy or easy bruising Neurological:Denies numbness, tingling or new weaknesses Behavioral/Psych: Mood is stable, no new changes  Breast:  denies any pain or lumps or nodules in either breasts All other systems were reviewed with the patient and are negative.  I have reviewed the past medical history, past surgical history, social history and family history with the patient and they are unchanged from previous note.  ALLERGIES:  is allergic to codeine; penicillins; shellfish allergy; and sulfonamide derivatives.  MEDICATIONS:  Current Outpatient Prescriptions  Medication Sig Dispense Refill  . allopurinol (ZYLOPRIM) 300 MG tablet Take 300 mg by mouth daily.    Marland Kitchen amiodarone (PACERONE) 200 MG tablet Take 1 tablet (200 mg total) by mouth daily. 30 tablet 4  . anastrozole (ARIMIDEX) 1 MG tablet Take 1 tablet (1 mg total) by mouth daily. 90 tablet 3  . Biotin 1000 MCG tablet Take 1,000 mcg by mouth as needed.    . bumetanide (BUMEX) 2 MG tablet   0  . diltiazem (CARDIZEM CD) 180 MG 24 hr capsule TAKE ONE CAPSULE BY MOUTH ONCE DAILY    . diltiazem (TIAZAC) 180 MG 24 hr capsule Take 180 mg by mouth daily.      Marland Kitchen doxazosin (CARDURA) 2 MG tablet Take 2 mg by mouth every morning.     . furosemide (LASIX) 80 MG tablet Take 80 mg by mouth daily.    Marland Kitchen HYDROcodone-acetaminophen (NORCO) 10-325 MG per tablet Take 1 tablet by mouth every 6 (six) hours as needed for severe pain. 20 tablet 0  . HYDROcodone-acetaminophen (  NORCO/VICODIN) 5-325 MG per tablet   0  . levothyroxine (SYNTHROID, LEVOTHROID) 25 MCG tablet   5  . metolazone (ZAROXOLYN) 2.5 MG tablet Take 2.5 mg by mouth daily.     . metoprolol (TOPROL-XL) 100 MG 24 hr tablet Take 100 mg by mouth daily.      . nitroGLYCERIN (NITROSTAT) 0.4 MG SL tablet Place 0.4 mg under the tongue every 5 (five)  minutes as needed for chest pain.    Marland Kitchen omeprazole (PRILOSEC) 40 MG capsule Take 40 mg by mouth daily.    . potassium chloride (KLOR-CON) 8 MEQ tablet Take 20 mEq by mouth daily. 6 in the am, 7 at lunch, 7 at night.    . pravastatin (PRAVACHOL) 20 MG tablet   3  . warfarin (COUMADIN) 5 MG tablet Use as directed by the anticoagulation clinic      No current facility-administered medications for this visit.    PHYSICAL EXAMINATION: ECOG PERFORMANCE STATUS: 1 - Symptomatic but completely ambulatory  Filed Vitals:   05/31/15 1349  BP: 126/63  Pulse: 83  Temp: 98.4 F (36.9 C)  Resp: 18   Filed Weights   05/31/15 1349  Weight: 247 lb 6.4 oz (112.22 kg)    GENERAL:alert, no distress and comfortable SKIN: skin color, texture, turgor are normal, no rashes or significant lesions EYES: normal, Conjunctiva are pink and non-injected, sclera clear OROPHARYNX:no exudate, no erythema and lips, buccal mucosa, and tongue normal  NECK: supple, thyroid normal size, non-tender, without nodularity LYMPH:  no palpable lymphadenopathy in the cervical, axillary or inguinal LUNGS: clear to auscultation and percussion with normal breathing effort HEART: regular rate & rhythm and no murmurs and no lower extremity edema ABDOMEN:abdomen soft, non-tender and normal bowel sounds Musculoskeletal:no cyanosis of digits and no clubbing  NEURO: alert & oriented x 3 with fluent speech, no focal motor/sensory deficits   LABORATORY DATA:  I have reviewed the data as listed   Chemistry      Component Value Date/Time   NA 139 02/07/2015 1115   K 3.5 02/07/2015 1115   CL 96 02/07/2015 1115   CO2 26 02/07/2015 1115   BUN 28* 02/07/2015 1115   CREATININE 1.24* 02/07/2015 1115      Component Value Date/Time   CALCIUM 9.2 02/07/2015 1115      ASSESSMENT & PLAN:  Breast cancer of upper-outer quadrant of right female breast Right breast low-grade DCIS 8 mm in size status post lumpectomy with focal inferior  margin positive requiring reexcision 02/15/2015 ER 100% PR 100% positive  Current treatment: Anastrozole 1 mg daily for 5 years started 02/17/2015  Anastrozole Toxicities: Denies any side effects anastrozole tolerating it very well. Denies any hot flashes or myalgias. Breast cancer surveillance: Patient would like to get her mammograms at [facility]. She will discuss this with her primary care physician Dr. Heber Montrose to enable her to get those mammograms locally.  Return to clinic in 6 months for follow-up for surveillance breast exams and monitoring of anastrozole toxicities.   No orders of the defined types were placed in this encounter.   The patient has a good understanding of the overall plan. she agrees with it. she will call with any problems that may develop before the next visit here.   Rulon Eisenmenger, MD

## 2015-06-06 ENCOUNTER — Encounter: Payer: Self-pay | Admitting: Internal Medicine

## 2015-06-06 ENCOUNTER — Ambulatory Visit (INDEPENDENT_AMBULATORY_CARE_PROVIDER_SITE_OTHER): Payer: Medicare Other | Admitting: *Deleted

## 2015-06-06 DIAGNOSIS — I480 Paroxysmal atrial fibrillation: Secondary | ICD-10-CM | POA: Diagnosis not present

## 2015-06-06 NOTE — Progress Notes (Signed)
Remote pacemaker transmission.   

## 2015-06-08 LAB — CUP PACEART REMOTE DEVICE CHECK
Battery Impedance: 2783 Ohm
Battery Remaining Longevity: 18 mo
Brady Statistic AP VP Percent: 0 %
Brady Statistic AS VP Percent: 0 %
Date Time Interrogation Session: 20160627133757
Lead Channel Impedance Value: 509 Ohm
Lead Channel Pacing Threshold Amplitude: 1 V
Lead Channel Pacing Threshold Pulse Width: 0.4 ms
Lead Channel Sensing Intrinsic Amplitude: 11.2 mV
Lead Channel Setting Pacing Amplitude: 2 V
Lead Channel Setting Pacing Amplitude: 2.5 V
Lead Channel Setting Pacing Pulse Width: 0.4 ms
Lead Channel Setting Sensing Sensitivity: 4 mV
MDC IDC MSMT BATTERY VOLTAGE: 2.7 V
MDC IDC MSMT LEADCHNL RA PACING THRESHOLD PULSEWIDTH: 0.4 ms
MDC IDC MSMT LEADCHNL RV IMPEDANCE VALUE: 1634 Ohm
MDC IDC MSMT LEADCHNL RV PACING THRESHOLD AMPLITUDE: 1.75 V
MDC IDC STAT BRADY AP VS PERCENT: 100 %
MDC IDC STAT BRADY AS VS PERCENT: 0 %

## 2015-07-06 ENCOUNTER — Encounter: Payer: Self-pay | Admitting: *Deleted

## 2015-08-30 ENCOUNTER — Ambulatory Visit (INDEPENDENT_AMBULATORY_CARE_PROVIDER_SITE_OTHER): Payer: Medicare Other | Admitting: Internal Medicine

## 2015-08-30 ENCOUNTER — Encounter: Payer: Self-pay | Admitting: Internal Medicine

## 2015-08-30 VITALS — BP 138/78 | HR 86 | Ht 66.0 in | Wt 244.2 lb

## 2015-08-30 DIAGNOSIS — Z950 Presence of cardiac pacemaker: Secondary | ICD-10-CM | POA: Diagnosis not present

## 2015-08-30 DIAGNOSIS — E669 Obesity, unspecified: Secondary | ICD-10-CM | POA: Diagnosis not present

## 2015-08-30 DIAGNOSIS — I480 Paroxysmal atrial fibrillation: Secondary | ICD-10-CM

## 2015-08-30 LAB — CUP PACEART INCLINIC DEVICE CHECK
Battery Remaining Longevity: 15 mo
Battery Voltage: 2.68 V
Brady Statistic AP VP Percent: 0 %
Brady Statistic AP VS Percent: 100 %
Brady Statistic AS VP Percent: 0 %
Lead Channel Impedance Value: 1446 Ohm
Lead Channel Impedance Value: 501 Ohm
Lead Channel Pacing Threshold Amplitude: 1 V
Lead Channel Pacing Threshold Pulse Width: 0.4 ms
Lead Channel Pacing Threshold Pulse Width: 0.4 ms
Lead Channel Sensing Intrinsic Amplitude: 11.2 mV
Lead Channel Setting Pacing Amplitude: 2.25 V
Lead Channel Setting Pacing Amplitude: 2.5 V
Lead Channel Setting Pacing Pulse Width: 0.4 ms
Lead Channel Setting Sensing Sensitivity: 4 mV
MDC IDC MSMT BATTERY IMPEDANCE: 3087 Ohm
MDC IDC MSMT LEADCHNL RA SENSING INTR AMPL: 2 mV
MDC IDC MSMT LEADCHNL RV PACING THRESHOLD AMPLITUDE: 1.25 V
MDC IDC SESS DTM: 20160920144859
MDC IDC STAT BRADY AS VS PERCENT: 0 %

## 2015-08-30 NOTE — Progress Notes (Signed)
HPI Traci [name] returns today for followup. She is a pleasant 81 year old woman with chronic diastolic heart failure, atrial fibrillation, morbid obesity, and symptomatic bradycardia, status post pacemaker insertion. In the interim, she has had rare palpitations and mild dyspnea with exertion. She denies syncope, chest pain, or shortness of breath. She has not been hospitalized. She denies fevers or chills. No cough. Last year I asked her to lose weight and she has lost 7 lbs. Allergies  Allergen Reactions  . Penicillins     "Felt like brain was swelling out of her head"  . Codeine     Stomach issues  . Shellfish Allergy     Large whelps   . Sulfonamide Derivatives     Immune to this     Current Outpatient Prescriptions  Medication Sig Dispense Refill  . allopurinol (ZYLOPRIM) 300 MG tablet Take 300 mg by mouth daily.    . Alum Hydroxide-Mag Carbonate (GAVISCON PO) Take 1 tablet by mouth daily.    Marland Kitchen amiodarone (PACERONE) 200 MG tablet Take 1 tablet (200 mg total) by mouth daily. 30 tablet 4  . anastrozole (ARIMIDEX) 1 MG tablet Take 1 tablet (1 mg total) by mouth daily. 90 tablet 3  . Biotin 1000 MCG tablet Take 1,000 mcg by mouth daily as needed (for supplement).     Marland Kitchen diltiazem (CARDIZEM CD) 180 MG 24 hr capsule TAKE ONE CAPSULE BY MOUTH ONCE DAILY    . doxazosin (CARDURA) 2 MG tablet Take 2 mg by mouth every morning.     . furosemide (LASIX) 80 MG tablet Take 80 mg by mouth daily.    Marland Kitchen HYDROcodone-acetaminophen (NORCO) 10-325 MG per tablet Take 1 tablet by mouth every 6 (six) hours as needed for severe pain. 20 tablet 0  . levothyroxine (SYNTHROID, LEVOTHROID) 25 MCG tablet Take 25 mcg by mouth daily before breakfast.   5  . metolazone (ZAROXOLYN) 2.5 MG tablet Take 2.5 mg by mouth daily.     . metoprolol (TOPROL-XL) 100 MG 24 hr tablet Take 100 mg by mouth daily.      . nitroGLYCERIN (NITROSTAT) 0.4 MG SL tablet Place 0.4 mg under the tongue every 5 (five) minutes as needed for chest  pain (MAX 3 TABLETS).     Marland Kitchen omeprazole (PRILOSEC) 40 MG capsule Take 40 mg by mouth daily.    . potassium chloride (KLOR-CON) 8 MEQ tablet Take 5 tablets by mouth in the am, 5 tablets at lunch, 5 tablets in the evening and 5 tablets at night.    . pravastatin (PRAVACHOL) 20 MG tablet Take 20 mg by mouth daily.   3  . warfarin (COUMADIN) 5 MG tablet Use as directed by the anticoagulation clinic      No current facility-administered medications for this visit.     Past Medical History  Diagnosis Date  . DOE (dyspnea on exertion)   . COPD (chronic obstructive pulmonary disease)   . Anxiety   . CHF (congestive heart failure)   . Diverticulitis of colon   . Paroxysmal atrial fibrillation   . HTN (hypertension)   . Obesity   . Hyperlipidemia   . DM (diabetes mellitus)   . Full dentures   . Wears glasses   . OSA (obstructive sleep apnea)     has a cpap-cannot use  . GERD (gastroesophageal reflux disease)   . Arthritis   . History of DVT (deep vein thrombosis)   . Presence of permanent cardiac pacemaker   . Hypothyroidism   .  Cancer     right breast cancer    ROS:   All systems reviewed and negative except as noted in the HPI.   Past Surgical History  Procedure Laterality Date  . Abdominal hysterectomy    . C-spine surgery  1992    cervical disk  . Lower back surgery  1980  . Esophagogastroduodenoscopy    . Dilation and curettage of uterus    . Cardiac catheterization  2005  . Pacemaker insertion  2007  . Colonoscopy    . Breast lumpectomy with radioactive seed localization Right 02/08/2015    Procedure: BREAST LUMPECTOMY WITH RADIOACTIVE SEED LOCALIZATION;  Surgeon: Stark Klein, MD;  Location: [facility];  Service: General;  Laterality: Right;  . Re-excision of breast lumpectomy Right 02/14/2015    Procedure: RE-EXCISION OF RIGHT BREAST LUMPECTOMY;  Surgeon: Stark Klein, MD;  Location: [facility];  Service: General;  Laterality: Right;  .  Cholecystectomy    . Back surgery       Family History  Problem Relation Age of Onset  . Heart failure Mother   . Pancreatic cancer Father 56  . Vaginal cancer Sister 46  . Hodgkin's lymphoma Sister 63  . Lung cancer Sister 26     Social History   Social History  . Marital Status: Married    Spouse Name: N/A  . Number of Children: N/A  . Years of Education: N/A   Occupational History  . Retired from [employer]  . Smoking status: Former Smoker -- 1.00 packs/day for 50 years    Quit date: 12/10/1996  . Smokeless tobacco: Not on file     Comment: gerater than 50 pack year history of tobacco-quit in 1998  . Alcohol Use: No  . Drug Use: No  . Sexual Activity: Yes   Other Topics Concern  . Not on file   Social History Narrative     BP 138/78 mmHg  Pulse 86  Ht '5\' 6"'$  (1.676 m)  Wt 244 lb 3.2 oz (110.768 kg)  BMI 39.43 kg/m2  Physical Exam:  obese appearing elderly woman, NAD HEENT: Unremarkable Neck: 6 cm JVD, no thyromegally Back:  No CVA tenderness Lungs:  Clear with no wheezes, rales, or rhonchi. HEART:  Regular rate rhythm, no murmurs, no rubs, no clicks Abd:  Soft, obese, positive bowel sounds, no organomegally, no rebound, no guarding Ext:  2 plus pulses, no edema, no cyanosis, no clubbing Skin:  No rashes no nodules Neuro:  CN II through XII intact, motor grossly intact  DEVICE  Normal device function.  See PaceArt for details.   Assess/Plan:

## 2015-08-30 NOTE — Assessment & Plan Note (Signed)
Her medtronic DDD PM is working normally. Will recheck in several months. 

## 2015-08-30 NOTE — Assessment & Plan Note (Signed)
She is maintaining NSR on low dose amiodarone. No change in her meds.

## 2015-08-30 NOTE — Assessment & Plan Note (Signed)
She has lost some weight. She will followup in a year and I would like her to lose 12 lbs.

## 2015-08-30 NOTE — Patient Instructions (Addendum)
Medication Instructions:  Your physician recommends that you continue on your current medications as directed. Please refer to the Current Medication list given to you today.   Labwork: None ordered  Testing/Procedures: None ordered  Follow-Up: Your physician wants you to follow-up in:  12 months with Dr Knox Saliva will receive a reminder letter in the mail two months in advance. If you don't receive a letter, please call our office to schedule the follow-up appointment.  Remote monitoring is used to monitor your Pacemaker  from home. This monitoring reduces the number of office visits required to check your device to one time per year. It allows [place] to keep an eye on the functioning of your device to ensure it is working properly. You are scheduled for a device check from home on 11/29/15. You may send your transmission at any time that day. If you have a wireless device, the transmission will be sent automatically. After your physician reviews your transmission, you will receive a postcard with your next transmission date.     Any Other Special Instructions Will Be Listed Below (If Applicable).

## 2015-11-29 ENCOUNTER — Ambulatory Visit (INDEPENDENT_AMBULATORY_CARE_PROVIDER_SITE_OTHER): Payer: Medicare Other | Admitting: *Deleted

## 2015-11-29 ENCOUNTER — Encounter: Payer: Self-pay | Admitting: Hematology and Oncology

## 2015-11-29 ENCOUNTER — Telehealth: Payer: Self-pay | Admitting: Hematology and Oncology

## 2015-11-29 ENCOUNTER — Ambulatory Visit (HOSPITAL_BASED_OUTPATIENT_CLINIC_OR_DEPARTMENT_OTHER): Payer: Medicare Other | Admitting: Hematology and Oncology

## 2015-11-29 ENCOUNTER — Telehealth: Payer: Self-pay | Admitting: Cardiology

## 2015-11-29 VITALS — BP 120/52 | HR 84 | Temp 98.2°F | Resp 22 | Ht 66.0 in | Wt 241.9 lb

## 2015-11-29 DIAGNOSIS — D0511 Intraductal carcinoma in situ of right breast: Secondary | ICD-10-CM

## 2015-11-29 DIAGNOSIS — C50411 Malignant neoplasm of upper-outer quadrant of right female breast: Secondary | ICD-10-CM

## 2015-11-29 DIAGNOSIS — Z79811 Long term (current) use of aromatase inhibitors: Secondary | ICD-10-CM

## 2015-11-29 DIAGNOSIS — Z950 Presence of cardiac pacemaker: Secondary | ICD-10-CM | POA: Diagnosis not present

## 2015-11-29 DIAGNOSIS — I480 Paroxysmal atrial fibrillation: Secondary | ICD-10-CM

## 2015-11-29 DIAGNOSIS — Z170 Estrogen receptor positive status [ER+]: Secondary | ICD-10-CM | POA: Diagnosis not present

## 2015-11-29 NOTE — Assessment & Plan Note (Signed)
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 Denies any hot flashes or myalgias. Breast cancer surveillance: Patient would like to get her mammograms at [facility]. She will discuss this with her primary care physician Dr. Heber [place] to enable her to get those mammograms locally.  Return to clinic in 6 months for follow-up for surveillance breast exams and monitoring of anastrozole toxicities.

## 2015-11-29 NOTE — Telephone Encounter (Signed)
Appointments made and avs printed for patient °

## 2015-11-29 NOTE — Progress Notes (Signed)
Patient Care Team: Raelene Bott, MD as PCP - General (Internal Medicine)  DIAGNOSIS: No matching staging information was found for the patient.  SUMMARY OF ONCOLOGIC HISTORY:   Breast cancer of upper-outer quadrant of right female breast ([place])   01/18/2015 Mammogram Right breast: 8 mm mass at 8:00 position   01/18/2015 Initial Biopsy Right breast needle core biopsy: DCIS with calcifications, low-grade, ER 100%, PR 100%   02/08/2015 Surgery Right breast lumpectomy Barry Dienes): Grade 2 DCIS, spanning 1 cm. Positive inferior margin.    02/08/2015 Pathologic Stage pTis, pNx: Stage 0   02/14/2015 Surgery Right breat re-excision Barry Dienes): Microscopic focus of DCIS. Negative margins.     Miscellaneous Discussion at breast tumor board conference was that patient would not be a candidate for adjuvant RT given her advanced age.    02/2015 -  Anti-estrogen oral therapy Anastrazole daily (due to h/o blood clots). Planned duration of treatment: 5 years. Lindi Adie)   04/29/2015 Survivorship Survivorship Care Plan mailed to pt per her request. She declined in-person visit.     CHIEF COMPLIANT: follow-up on anastrozole  INTERVAL HISTORY: Traci [name] is a 81 year old with above-mentioned history of right breast DCIS currently on hormone therapy with anastrozole. She appears to be tolerating it very well. She has very occasional hot flashes. She has arthritis. She has a hard time ambulating so she has not been doing any physical activity or exercises. She complains of difficulty falling asleep at bedtime.  REVIEW OF SYSTEMS:   Constitutional: Denies fevers, chills or abnormal weight loss Eyes: Denies blurriness of vision Ears, nose, mouth, throat, and face: Denies mucositis or sore throat Respiratory: Denies cough, dyspnea or wheezes Cardiovascular: Denies palpitation, chest discomfort or lower extremity swelling Gastrointestinal:  Denies nausea, heartburn or change in bowel habits Skin: Denies abnormal skin  rashes Lymphatics: Denies new lymphadenopathy or easy bruising Neurological:Denies numbness, tingling or new weaknesses Behavioral/Psych: Mood is stable, no new changes  Breast:  denies any pain or lumps or nodules in either breasts All other systems were reviewed with the patient and are negative.  I have reviewed the past medical history, past surgical history, social history and family history with the patient and they are unchanged from previous note.  ALLERGIES:  is allergic to penicillins; codeine; shellfish allergy; and sulfonamide derivatives.  MEDICATIONS:  Current Outpatient Prescriptions  Medication Sig Dispense Refill  . allopurinol (ZYLOPRIM) 300 MG tablet Take 300 mg by mouth daily.    . Alum Hydroxide-Mag Carbonate (GAVISCON PO) Take 1 tablet by mouth daily.    Marland Kitchen amiodarone (PACERONE) 200 MG tablet Take 1 tablet (200 mg total) by mouth daily. 30 tablet 4  . anastrozole (ARIMIDEX) 1 MG tablet Take 1 tablet (1 mg total) by mouth daily. 90 tablet 3  . Biotin 1000 MCG tablet Take 1,000 mcg by mouth daily as needed (for supplement).     Marland Kitchen diltiazem (CARDIZEM CD) 180 MG 24 hr capsule TAKE ONE CAPSULE BY MOUTH ONCE DAILY    . doxazosin (CARDURA) 2 MG tablet Take 2 mg by mouth every morning.     . furosemide (LASIX) 80 MG tablet Take 80 mg by mouth daily.    Marland Kitchen HYDROcodone-acetaminophen (NORCO) 10-325 MG per tablet Take 1 tablet by mouth every 6 (six) hours as needed for severe pain. 20 tablet 0  . levothyroxine (SYNTHROID, LEVOTHROID) 25 MCG tablet Take 25 mcg by mouth daily before breakfast.   5  . metolazone (ZAROXOLYN) 2.5 MG tablet Take 2.5 mg by mouth  daily.     . metoprolol (TOPROL-XL) 100 MG 24 hr tablet Take 100 mg by mouth daily.      . nitroGLYCERIN (NITROSTAT) 0.4 MG SL tablet Place 0.4 mg under the tongue every 5 (five) minutes as needed for chest pain (MAX 3 TABLETS).     Marland Kitchen omeprazole (PRILOSEC) 40 MG capsule Take 40 mg by mouth daily.    . potassium chloride  (KLOR-CON) 8 MEQ tablet Take 5 tablets by mouth in the am, 5 tablets at lunch, 5 tablets in the evening and 5 tablets at night.    . pravastatin (PRAVACHOL) 20 MG tablet Take 20 mg by mouth daily.   3  . warfarin (COUMADIN) 5 MG tablet Use as directed by the anticoagulation clinic      No current facility-administered medications for this visit.    PHYSICAL EXAMINATION: ECOG PERFORMANCE STATUS: 1 - Symptomatic but completely ambulatory  Filed Vitals:   11/29/15 1314  BP: 120/52  Pulse: 84  Temp: 98.2 F (36.8 C)  Resp: 22   Filed Weights   11/29/15 1314  Weight: 241 lb 14.4 oz (109.725 kg)    GENERAL:alert, no distress and comfortable SKIN: skin color, texture, turgor are normal, no rashes or significant lesions EYES: normal, Conjunctiva are pink and non-injected, sclera clear OROPHARYNX:no exudate, no erythema and lips, buccal mucosa, and tongue normal  NECK: supple, thyroid normal size, non-tender, without nodularity LYMPH:  no palpable lymphadenopathy in the cervical, axillary or inguinal LUNGS: clear to auscultation and percussion with normal breathing effort HEART: regular rate & rhythm and no murmurs and no lower extremity edema ABDOMEN:abdomen soft, non-tender and normal bowel sounds Musculoskeletal:no cyanosis of digits and no clubbing  NEURO: alert & oriented x 3 with fluent speech, no focal motor/sensory deficits BREAST: No palpable masses or nodules in either right or left breasts. No palpable axillary supraclavicular or infraclavicular adenopathy no breast tenderness or nipple discharge. 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 Very occasional asthma clinically regarding this as a commercial property in [place] and observing normal range and I need to give you send a fax of his [place] person at the change of address for you to some communication hot flashes or myalgias.  Breast cancer surveillance: Patient would like to get her mammograms at [facility]. She will discuss this with her primary care physician Dr. Heber [place] to enable her to get those mammograms locally. Patient will need a bone density test every other year.  Return to clinic in 1 year for follow-up for surveillance breast exams and monitoring of anastrozole toxicities.  No orders of the defined types were placed in this encounter.   The patient has a good understanding  of the overall plan. she agrees with it. she will call with any problems that may develop before the next visit here.   Rulon Eisenmenger, MD 11/29/2015

## 2015-11-29 NOTE — Telephone Encounter (Signed)
LMOVM reminding pt to send remote transmission.   

## 2015-11-30 NOTE — Progress Notes (Signed)
Remote pacemaker transmission.   

## 2015-12-02 ENCOUNTER — Encounter: Payer: Self-pay | Admitting: Cardiology

## 2015-12-02 LAB — CUP PACEART REMOTE DEVICE CHECK
Battery Remaining Longevity: 8 mo
Battery Voltage: 2.67 V
Brady Statistic AS VP Percent: 0 %
Date Time Interrogation Session: 20161220194421
Implantable Lead Implant Date: 20070830
Implantable Lead Location: 753859
Implantable Lead Location: 753860
Implantable Lead Model: 5076
Implantable Lead Model: 5076
Lead Channel Impedance Value: 468 Ohm
Lead Channel Pacing Threshold Amplitude: 1.75 V
Lead Channel Pacing Threshold Pulse Width: 0.4 ms
Lead Channel Pacing Threshold Pulse Width: 0.4 ms
Lead Channel Setting Pacing Amplitude: 2.5 V
Lead Channel Setting Pacing Pulse Width: 0.4 ms
Lead Channel Setting Sensing Sensitivity: 4 mV
MDC IDC LEAD IMPLANT DT: 20070830
MDC IDC MSMT BATTERY IMPEDANCE: 4299 Ohm
MDC IDC MSMT LEADCHNL RA PACING THRESHOLD AMPLITUDE: 1.125 V
MDC IDC MSMT LEADCHNL RV IMPEDANCE VALUE: 1631 Ohm
MDC IDC MSMT LEADCHNL RV SENSING INTR AMPL: 11.2 mV
MDC IDC SET LEADCHNL RA PACING AMPLITUDE: 2.25 V
MDC IDC STAT BRADY AP VP PERCENT: 0 %
MDC IDC STAT BRADY AP VS PERCENT: 99 %
MDC IDC STAT BRADY AS VS PERCENT: 0 %

## 2015-12-08 ENCOUNTER — Encounter: Payer: Self-pay | Admitting: Adult Health

## 2015-12-08 NOTE — Progress Notes (Signed)
A birthday card was mailed to the patient today on behalf of the Survivorship Program at [facility].   Gretchen Dawson, NP Survivorship Program [facility] [phone]  

## 2016-01-02 ENCOUNTER — Ambulatory Visit (INDEPENDENT_AMBULATORY_CARE_PROVIDER_SITE_OTHER): Payer: Medicare Other | Admitting: *Deleted

## 2016-01-02 DIAGNOSIS — Z950 Presence of cardiac pacemaker: Secondary | ICD-10-CM

## 2016-01-03 NOTE — Progress Notes (Signed)
Remote pacemaker transmission.   

## 2016-01-04 LAB — CUP PACEART REMOTE DEVICE CHECK
Battery Impedance: 4884 Ohm
Battery Remaining Longevity: 5 mo
Battery Voltage: 2.63 V
Brady Statistic AP VP Percent: 0 %
Brady Statistic AS VP Percent: 0 %
Implantable Lead Implant Date: 20070830
Implantable Lead Location: 753860
Implantable Lead Model: 5076
Implantable Lead Model: 5076
Lead Channel Impedance Value: 1678 Ohm
Lead Channel Setting Pacing Amplitude: 2.25 V
MDC IDC LEAD IMPLANT DT: 20070830
MDC IDC LEAD LOCATION: 753859
MDC IDC MSMT LEADCHNL RA IMPEDANCE VALUE: 547 Ohm
MDC IDC SESS DTM: 20170123152053
MDC IDC SET LEADCHNL RV PACING AMPLITUDE: 2.5 V
MDC IDC SET LEADCHNL RV PACING PULSEWIDTH: 0.4 ms
MDC IDC SET LEADCHNL RV SENSING SENSITIVITY: 5.6 mV
MDC IDC STAT BRADY AP VS PERCENT: 99 %
MDC IDC STAT BRADY AS VS PERCENT: 1 %

## 2016-01-06 ENCOUNTER — Other Ambulatory Visit: Payer: Self-pay

## 2016-01-06 DIAGNOSIS — C50411 Malignant neoplasm of upper-outer quadrant of right female breast: Secondary | ICD-10-CM

## 2016-01-06 MED ORDER — ANASTROZOLE 1 MG PO TABS: 1.0000 mg | ORAL_TABLET | Freq: Every day | ORAL | Status: AC

## 2016-01-11 ENCOUNTER — Encounter: Payer: Self-pay | Admitting: Cardiology

## 2016-02-03 ENCOUNTER — Telehealth: Payer: Self-pay | Admitting: Cardiology

## 2016-02-03 ENCOUNTER — Ambulatory Visit (INDEPENDENT_AMBULATORY_CARE_PROVIDER_SITE_OTHER): Payer: Medicare Other | Admitting: *Deleted

## 2016-02-03 DIAGNOSIS — Z950 Presence of cardiac pacemaker: Secondary | ICD-10-CM

## 2016-02-03 NOTE — Progress Notes (Signed)
Remote pacemaker transmission.   

## 2016-02-03 NOTE — Telephone Encounter (Signed)
Spoke with pt and reminded pt of remote transmission that is due today. Pt verbalized understanding.   

## 2016-02-28 LAB — CUP PACEART REMOTE DEVICE CHECK
Battery Impedance: 5667 Ohm
Battery Remaining Longevity: 3 mo
Brady Statistic AP VP Percent: 0 %
Brady Statistic AP VS Percent: 99 %
Brady Statistic AS VP Percent: 0 %
Brady Statistic AS VS Percent: 1 %
Date Time Interrogation Session: 20170224142402
Implantable Lead Implant Date: 20070830
Implantable Lead Location: 753859
Implantable Lead Location: 753860
Implantable Lead Model: 5076
Implantable Lead Model: 5076
Lead Channel Impedance Value: 1540 Ohm
Lead Channel Impedance Value: 515 Ohm
Lead Channel Setting Pacing Amplitude: 2.5 V
Lead Channel Setting Sensing Sensitivity: 4 mV
MDC IDC LEAD IMPLANT DT: 20070830
MDC IDC MSMT BATTERY VOLTAGE: 2.62 V
MDC IDC SET LEADCHNL RA PACING AMPLITUDE: 2 V
MDC IDC SET LEADCHNL RV PACING PULSEWIDTH: 0.4 ms

## 2016-02-29 ENCOUNTER — Encounter: Payer: Self-pay | Admitting: Cardiology

## 2016-03-01 ENCOUNTER — Telehealth: Payer: Self-pay | Admitting: Cardiology

## 2016-03-01 NOTE — Telephone Encounter (Signed)
Spoke w/ pt and informed her that her device has reached RRT. Informed her that at this point her device has about 3 months left on the battery life. Told pt that a scheduler will be calling to schedule an appt w/ MD / NP / PA to discuss procedure and procedure will be scheduled for another date. Pt stated that she knew something was wrong b/c she has been experiencing some chest discomfort that is why she sent the transmission. Pt verbalized understanding.

## 2016-03-02 ENCOUNTER — Inpatient Hospital Stay (HOSPITAL_COMMUNITY)
Admission: EM | Admit: 2016-03-02 | Discharge: 2016-03-06 | DRG: 258 | Disposition: A | Discharge status: Home / Self Care | Payer: Medicare Other | Attending: Oncology | Admitting: Oncology

## 2016-03-02 ENCOUNTER — Encounter (HOSPITAL_COMMUNITY): Payer: Self-pay | Admitting: Emergency Medicine

## 2016-03-02 ENCOUNTER — Emergency Department (HOSPITAL_COMMUNITY): Payer: Medicare Other

## 2016-03-02 DIAGNOSIS — I130 Hypertensive heart and chronic kidney disease with heart failure and stage 1 through stage 4 chronic kidney disease, or unspecified chronic kidney disease: Secondary | ICD-10-CM | POA: Diagnosis present

## 2016-03-02 DIAGNOSIS — R0602 Shortness of breath: Secondary | ICD-10-CM | POA: Diagnosis present

## 2016-03-02 DIAGNOSIS — J449 Chronic obstructive pulmonary disease, unspecified: Secondary | ICD-10-CM | POA: Diagnosis present

## 2016-03-02 DIAGNOSIS — Z79899 Other long term (current) drug therapy: Secondary | ICD-10-CM

## 2016-03-02 DIAGNOSIS — Z45010 Encounter for checking and testing of cardiac pacemaker pulse generator [battery]: Secondary | ICD-10-CM | POA: Diagnosis not present

## 2016-03-02 DIAGNOSIS — R079 Chest pain, unspecified: Secondary | ICD-10-CM | POA: Diagnosis not present

## 2016-03-02 DIAGNOSIS — Y831 Surgical operation with implant of artificial internal device as the cause of abnormal reaction of the patient, or of later complication, without mention of misadventure at the time of the procedure: Secondary | ICD-10-CM | POA: Diagnosis present

## 2016-03-02 DIAGNOSIS — E669 Obesity, unspecified: Secondary | ICD-10-CM | POA: Diagnosis present

## 2016-03-02 DIAGNOSIS — Z6838 Body mass index (BMI) 38.0-38.9, adult: Secondary | ICD-10-CM

## 2016-03-02 DIAGNOSIS — N183 Chronic kidney disease, stage 3 (moderate): Secondary | ICD-10-CM | POA: Diagnosis present

## 2016-03-02 DIAGNOSIS — I5032 Chronic diastolic (congestive) heart failure: Secondary | ICD-10-CM | POA: Insufficient documentation

## 2016-03-02 DIAGNOSIS — I5033 Acute on chronic diastolic (congestive) heart failure: Secondary | ICD-10-CM | POA: Diagnosis present

## 2016-03-02 DIAGNOSIS — T82111A Breakdown (mechanical) of cardiac pulse generator (battery), initial encounter: Principal | ICD-10-CM | POA: Diagnosis present

## 2016-03-02 DIAGNOSIS — Z7901 Long term (current) use of anticoagulants: Secondary | ICD-10-CM

## 2016-03-02 DIAGNOSIS — I10 Essential (primary) hypertension: Secondary | ICD-10-CM | POA: Diagnosis present

## 2016-03-02 DIAGNOSIS — Z86718 Personal history of other venous thrombosis and embolism: Secondary | ICD-10-CM | POA: Diagnosis not present

## 2016-03-02 DIAGNOSIS — I495 Sick sinus syndrome: Secondary | ICD-10-CM | POA: Diagnosis not present

## 2016-03-02 DIAGNOSIS — Z87891 Personal history of nicotine dependence: Secondary | ICD-10-CM | POA: Diagnosis not present

## 2016-03-02 DIAGNOSIS — Z950 Presence of cardiac pacemaker: Secondary | ICD-10-CM | POA: Diagnosis not present

## 2016-03-02 DIAGNOSIS — N179 Acute kidney failure, unspecified: Secondary | ICD-10-CM | POA: Diagnosis present

## 2016-03-02 DIAGNOSIS — K219 Gastro-esophageal reflux disease without esophagitis: Secondary | ICD-10-CM | POA: Diagnosis present

## 2016-03-02 DIAGNOSIS — E1122 Type 2 diabetes mellitus with diabetic chronic kidney disease: Secondary | ICD-10-CM | POA: Diagnosis present

## 2016-03-02 DIAGNOSIS — E039 Hypothyroidism, unspecified: Secondary | ICD-10-CM | POA: Diagnosis present

## 2016-03-02 DIAGNOSIS — G4733 Obstructive sleep apnea (adult) (pediatric): Secondary | ICD-10-CM | POA: Diagnosis present

## 2016-03-02 DIAGNOSIS — I480 Paroxysmal atrial fibrillation: Secondary | ICD-10-CM | POA: Diagnosis present

## 2016-03-02 DIAGNOSIS — E876 Hypokalemia: Secondary | ICD-10-CM | POA: Diagnosis present

## 2016-03-02 DIAGNOSIS — I509 Heart failure, unspecified: Secondary | ICD-10-CM | POA: Diagnosis not present

## 2016-03-02 DIAGNOSIS — J810 Acute pulmonary edema: Secondary | ICD-10-CM | POA: Insufficient documentation

## 2016-03-02 DIAGNOSIS — Z79811 Long term (current) use of aromatase inhibitors: Secondary | ICD-10-CM

## 2016-03-02 DIAGNOSIS — C50411 Malignant neoplasm of upper-outer quadrant of right female breast: Secondary | ICD-10-CM | POA: Diagnosis present

## 2016-03-02 DIAGNOSIS — E785 Hyperlipidemia, unspecified: Secondary | ICD-10-CM | POA: Diagnosis present

## 2016-03-02 DIAGNOSIS — I4891 Unspecified atrial fibrillation: Secondary | ICD-10-CM | POA: Diagnosis present

## 2016-03-02 DIAGNOSIS — E1129 Type 2 diabetes mellitus with other diabetic kidney complication: Secondary | ICD-10-CM | POA: Diagnosis present

## 2016-03-02 HISTORY — DX: Hypokalemia: E87.6

## 2016-03-02 HISTORY — DX: Sick sinus syndrome: I49.5

## 2016-03-02 LAB — COMPREHENSIVE METABOLIC PANEL
ALBUMIN: 3.4 g/dL — AB (ref 3.5–5.0)
ALK PHOS: 61 U/L (ref 38–126)
ALT: 27 U/L (ref 14–54)
AST: 38 U/L (ref 15–41)
Anion gap: 13 (ref 5–15)
BUN: 27 mg/dL — AB (ref 6–20)
CALCIUM: 8.8 mg/dL — AB (ref 8.9–10.3)
CO2: 28 mmol/L (ref 22–32)
CREATININE: 1.26 mg/dL — AB (ref 0.44–1.00)
Chloride: 99 mmol/L — ABNORMAL LOW (ref 101–111)
GFR calc non Af Amer: 39 mL/min — ABNORMAL LOW (ref >60)
GFR, EST AFRICAN AMERICAN: 45 mL/min — AB (ref >60)
GLUCOSE: 141 mg/dL — AB (ref 65–99)
Potassium: 3.5 mmol/L (ref 3.5–5.1)
SODIUM: 140 mmol/L (ref 135–145)
Total Bilirubin: 0.8 mg/dL (ref 0.3–1.2)
Total Protein: 6.1 g/dL — ABNORMAL LOW (ref 6.5–8.1)

## 2016-03-02 LAB — CBC WITH DIFFERENTIAL/PLATELET
Basophils Absolute: 0 10*3/uL (ref 0.0–0.1)
Basophils Relative: 0 %
EOS ABS: 0.2 10*3/uL (ref 0.0–0.7)
Eosinophils Relative: 2 %
HCT: 37.8 % (ref 36.0–46.0)
HEMOGLOBIN: 12.1 g/dL (ref 12.0–15.0)
LYMPHS ABS: 1.7 10*3/uL (ref 0.7–4.0)
LYMPHS PCT: 15 %
MCH: 29.2 pg (ref 26.0–34.0)
MCHC: 32 g/dL (ref 30.0–36.0)
MCV: 91.1 fL (ref 78.0–100.0)
Monocytes Absolute: 1 10*3/uL (ref 0.1–1.0)
Monocytes Relative: 9 %
NEUTROS PCT: 74 %
Neutro Abs: 8.3 10*3/uL — ABNORMAL HIGH (ref 1.7–7.7)
Platelets: 253 10*3/uL (ref 150–400)
RBC: 4.15 MIL/uL (ref 3.87–5.11)
RDW: 14.9 % (ref 11.5–15.5)
WBC: 10.8 10*3/uL — AB (ref 4.0–10.5)

## 2016-03-02 LAB — I-STAT CHEM 8, ED
BUN: 28 mg/dL — AB (ref 6–20)
CALCIUM ION: 1.05 mmol/L — AB (ref 1.13–1.30)
CHLORIDE: 95 mmol/L — AB (ref 101–111)
Creatinine, Ser: 1.2 mg/dL — ABNORMAL HIGH (ref 0.44–1.00)
Glucose, Bld: 96 mg/dL (ref 65–99)
HCT: 42 % (ref 36.0–46.0)
Hemoglobin: 14.3 g/dL (ref 12.0–15.0)
Potassium: 3.4 mmol/L — ABNORMAL LOW (ref 3.5–5.1)
SODIUM: 138 mmol/L (ref 135–145)
TCO2: 29 mmol/L (ref 0–100)

## 2016-03-02 LAB — I-STAT TROPONIN, ED: Troponin i, poc: 0 ng/mL (ref 0.00–0.08)

## 2016-03-02 LAB — TSH: TSH: 4.11 u[IU]/mL (ref 0.350–4.500)

## 2016-03-02 LAB — LIPASE, BLOOD: LIPASE: 37 U/L (ref 11–51)

## 2016-03-02 LAB — BRAIN NATRIURETIC PEPTIDE: B Natriuretic Peptide: 566 pg/mL — ABNORMAL HIGH (ref 0.0–100.0)

## 2016-03-02 LAB — TROPONIN I: Troponin I: <0.03 ng/mL (ref <0.031)

## 2016-03-02 LAB — GLUCOSE, CAPILLARY: Glucose-Capillary: 141 mg/dL — ABNORMAL HIGH (ref 65–99)

## 2016-03-02 LAB — PROTIME-INR
INR: 2.62 — ABNORMAL HIGH (ref 0.00–1.49)
Prothrombin Time: 27.6 seconds — ABNORMAL HIGH (ref 11.6–15.2)

## 2016-03-02 LAB — MAGNESIUM: Magnesium: 1.8 mg/dL (ref 1.7–2.4)

## 2016-03-02 MED ORDER — ALLOPURINOL 300 MG PO TABS
300.0000 mg | ORAL_TABLET | Freq: Every day | ORAL | Status: DC
  Administered 2016-03-03 – 2016-03-06 (×4): 300 mg via ORAL
  Filled 2016-03-02 (×4): qty 1

## 2016-03-02 MED ORDER — AMIODARONE HCL 200 MG PO TABS
200.0000 mg | ORAL_TABLET | Freq: Every day | ORAL | Status: DC
  Administered 2016-03-03 – 2016-03-06 (×4): 200 mg via ORAL
  Filled 2016-03-02 (×4): qty 1

## 2016-03-02 MED ORDER — WARFARIN - PHARMACIST DOSING INPATIENT
Freq: Every day | Status: DC
  Administered 2016-03-03: 18:00:00

## 2016-03-02 MED ORDER — HYDROCODONE-ACETAMINOPHEN 10-325 MG PO TABS
1.0000 | ORAL_TABLET | Freq: Every day | ORAL | Status: DC | PRN
  Administered 2016-03-02 – 2016-03-06 (×2): 1 via ORAL
  Filled 2016-03-02 (×2): qty 1

## 2016-03-02 MED ORDER — METOPROLOL SUCCINATE ER 100 MG PO TB24
100.0000 mg | ORAL_TABLET | Freq: Every day | ORAL | Status: DC
  Administered 2016-03-03 – 2016-03-06 (×4): 100 mg via ORAL
  Filled 2016-03-02 (×4): qty 1

## 2016-03-02 MED ORDER — LEVOTHYROXINE SODIUM 25 MCG PO TABS
25.0000 ug | ORAL_TABLET | Freq: Every day | ORAL | Status: DC
  Administered 2016-03-03 – 2016-03-06 (×4): 25 ug via ORAL
  Filled 2016-03-02 (×5): qty 1

## 2016-03-02 MED ORDER — ANASTROZOLE 1 MG PO TABS
1.0000 mg | ORAL_TABLET | Freq: Every day | ORAL | Status: DC
  Administered 2016-03-03 – 2016-03-06 (×4): 1 mg via ORAL
  Filled 2016-03-02 (×9): qty 1

## 2016-03-02 MED ORDER — DILTIAZEM HCL ER COATED BEADS 240 MG PO CP24
240.0000 mg | ORAL_CAPSULE | Freq: Every day | ORAL | Status: DC
  Administered 2016-03-03 – 2016-03-06 (×4): 240 mg via ORAL
  Filled 2016-03-02 (×4): qty 1

## 2016-03-02 MED ORDER — WARFARIN SODIUM 5 MG PO TABS
5.0000 mg | ORAL_TABLET | Freq: Once | ORAL | Status: AC
  Administered 2016-03-02: 5 mg via ORAL
  Filled 2016-03-02: qty 1

## 2016-03-02 MED ORDER — FUROSEMIDE 10 MG/ML IJ SOLN
40.0000 mg | Freq: Two times a day (BID) | INTRAMUSCULAR | Status: DC
  Administered 2016-03-03: 40 mg via INTRAVENOUS
  Filled 2016-03-02: qty 4

## 2016-03-02 MED ORDER — PRAVASTATIN SODIUM 20 MG PO TABS
20.0000 mg | ORAL_TABLET | Freq: Every day | ORAL | Status: DC
  Administered 2016-03-02 – 2016-03-05 (×4): 20 mg via ORAL
  Filled 2016-03-02 (×4): qty 1

## 2016-03-02 MED ORDER — PANTOPRAZOLE SODIUM 40 MG PO TBEC
40.0000 mg | DELAYED_RELEASE_TABLET | Freq: Every day | ORAL | Status: DC
  Administered 2016-03-03 – 2016-03-06 (×4): 40 mg via ORAL
  Filled 2016-03-02 (×4): qty 1

## 2016-03-02 NOTE — Progress Notes (Addendum)
ANTICOAGULATION CONSULT NOTE - Initial Consult  Pharmacy Consult for coumadin Indication: atrial fibrillation  Allergies  Allergen Reactions  . Penicillins Swelling and Other (See Comments)    "Felt like brain was swelling out of her head"  . Codeine Other (See Comments)    Stomach issues  . Shellfish Allergy Hives and Itching  . Sulfonamide Derivatives Other (See Comments)    Immune to this    Patient Measurements: Height: '5\' 7"'$  (170.2 cm) Weight: 241 lb 1.6 oz (109.362 kg) IBW/kg (Calculated) : 61.6  Vital Signs: Temp: 97.9 F (36.6 C) (03/24 1537) Temp Source: Oral (03/24 1537) BP: 107/61 mmHg (03/24 1915) Pulse Rate: 64 (03/24 1915)  Labs:  Recent Labs  03/02/16 1600 03/02/16 1610  HGB 12.1 14.3  HCT 37.8 42.0  PLT 253  --   LABPROT 27.6*  --   INR 2.62*  --   CREATININE  --  1.20*    Estimated Creatinine Clearance: 46 mL/min (by C-G formula based on Cr of 1.2).   Medical History: Past Medical History  Diagnosis Date  . DOE (dyspnea on exertion)   . COPD (chronic obstructive pulmonary disease) ([place])   . Anxiety   . CHF (congestive heart failure) ([place])   . Diverticulitis of colon   . Paroxysmal atrial fibrillation ([facility])   . HTN (hypertension)   . Obesity   . Hyperlipidemia   . DM (diabetes mellitus) ([place])   . Full dentures   . Wears glasses   . OSA (obstructive sleep apnea)     has a cpap-cannot use  . GERD (gastroesophageal reflux disease)   . Arthritis   . History of DVT (deep vein thrombosis)   . Presence of permanent cardiac pacemaker   . Hypothyroidism   . Cancer [facility])     right breast cancer   Assessment: 80 YOF on coumadin PTA for afib, presented with SOB/CP, pharmacy is consulted to manage coumadin dosing. INR 2.62, therapeutic. Continue home dose amiodarone.  PTA dose: '5mg'$  on Wed/Thu/Fri, 2.5 mg all other days, last dose 3/23  Goal of Therapy:  INR 2-3 Monitor platelets by anticoagulation protocol: Yes   Plan:  - Coumadin 5  mg po x 1 tonight - Daily PT/INR  Maryanna Shape, PharmD, BCPS  Clinical Pharmacist  Pager: [phone]   03/02/2016,9:46 PM

## 2016-03-02 NOTE — ED Provider Notes (Signed)
CSN: [id number]     Arrival date & time 03/02/16  1527 History   First MD Initiated Contact with Patient 03/02/16 1529     Chief Complaint  Patient presents with  . Shortness of Breath  . Chest Pain     Patient is a 82 y.o. female presenting with shortness of breath and chest pain. The history is provided by the patient.  Shortness of Breath Associated symptoms: chest pain and cough   Associated symptoms: no abdominal pain, no headaches, no rash and no vomiting   Chest Pain Associated symptoms: cough, fatigue and shortness of breath   Associated symptoms: no abdominal pain, no back pain, no headache, no nausea, no numbness, not vomiting and no weakness   Patient presents with shortness of breath. She's had for last 2 days. She unable to do the same amount of activity is normal. States she went her primary care doctor was told to come any. His a history of CHF and pacemaker. States she interrogated her pacemaker home and was told that his battery was dead. It is actually just a battery replacement time and she still has around 3 months left. Does have occasional chest pain. States it is not associated with exertion. She did take a nitroglycerin on Wednesday and another one today both which helped the chest pain little bit. Small chest pain now but not as severe as it has been. No fevers or chills. She's had occasional cough without production. She states her weight is up 4 pounds. States she has never had a heart attack.  Past Medical History  Diagnosis Date  . DOE (dyspnea on exertion)   . COPD (chronic obstructive pulmonary disease) ([place])   . Anxiety   . CHF (congestive heart failure) ([place])   . Diverticulitis of colon   . Paroxysmal atrial fibrillation (HCC)   . HTN (hypertension)   . Obesity   . Hyperlipidemia   . DM (diabetes mellitus) ([place])   . Full dentures   . Wears glasses   . OSA (obstructive sleep apnea)     has a cpap-cannot use  . GERD (gastroesophageal reflux disease)    . Arthritis   . History of DVT (deep vein thrombosis)   . Presence of permanent cardiac pacemaker   . Hypothyroidism   . Cancer [facility])     right breast cancer   Past Surgical History  Procedure Laterality Date  . Abdominal hysterectomy    . C-spine surgery  1992    cervical disk  . Lower back surgery  1980  . Esophagogastroduodenoscopy    . Dilation and curettage of uterus    . Cardiac catheterization  2005  . Pacemaker insertion  2007  . Colonoscopy    . Breast lumpectomy with radioactive seed localization Right 02/08/2015    Procedure: BREAST LUMPECTOMY WITH RADIOACTIVE SEED LOCALIZATION;  Surgeon: Stark Klein, MD;  Location: [place];  Service: General;  Laterality: Right;  . Re-excision of breast lumpectomy Right 02/14/2015    Procedure: RE-EXCISION OF RIGHT BREAST LUMPECTOMY;  Surgeon: Stark Klein, MD;  Location: [place];  Service: General;  Laterality: Right;  . Cholecystectomy    . Back surgery     Family History  Problem Relation Age of Onset  . Heart failure Mother   . Pancreatic cancer Father 31  . Vaginal cancer Sister 8  . Hodgkin's lymphoma Sister 59  . Lung cancer Sister 73   Social History  Substance Use Topics  .  Smoking status: Former Smoker -- 1.00 packs/day for 50 years    Quit date: 12/10/1996  . Smokeless tobacco: None     Comment: gerater than 50 pack year history of tobacco-quit in 1998  . Alcohol Use: No   OB History    No data available     Review of Systems  Constitutional: Positive for fatigue. Negative for activity change and appetite change.  Eyes: Negative for pain.  Respiratory: Positive for cough and shortness of breath. Negative for chest tightness.   Cardiovascular: Positive for chest pain. Negative for leg swelling.  Gastrointestinal: Negative for nausea, vomiting, abdominal pain and diarrhea.  Genitourinary: Negative for flank pain.  Musculoskeletal: Negative for back pain and neck stiffness.   Skin: Negative for rash.  Neurological: Negative for weakness, numbness and headaches.  Psychiatric/Behavioral: Negative for behavioral problems.      Allergies  Penicillins; Codeine; Shellfish allergy; and Sulfonamide derivatives  Home Medications   Prior to Admission medications   Medication Sig Start Date End Date Taking? Authorizing Provider  allopurinol (ZYLOPRIM) 300 MG tablet Take 300 mg by mouth daily.   Yes Historical Provider, MD  alum hydroxide-mag trisilicate (GAVISCON) 53-29 MG CHEW chewable tablet Chew 1 tablet by mouth 4 (four) times daily as needed for indigestion or heartburn.   Yes Historical Provider, MD  amiodarone (PACERONE) 200 MG tablet Take 1 tablet (200 mg total) by mouth daily. 11/19/13  Yes Thompson Grayer, MD  anastrozole (ARIMIDEX) 1 MG tablet Take 1 tablet (1 mg total) by mouth daily. 01/06/16  Yes Nicholas Lose, MD  bismuth subsalicylate (PEPTO BISMOL) 262 MG/15ML suspension Take 30 mLs by mouth every 6 (six) hours as needed.   Yes Historical Provider, MD  diltiazem (CARDIZEM CD) 180 MG 24 hr capsule TAKE ONE CAPSULE BY MOUTH ONCE DAILY 01/26/15  Yes Historical Provider, MD  diphenhydramine-acetaminophen (TYLENOL PM) 25-500 MG TABS tablet Take 1 tablet by mouth at bedtime as needed.   Yes Historical Provider, MD  doxazosin (CARDURA) 2 MG tablet Take 2 mg by mouth every morning.    Yes Historical Provider, MD  furosemide (LASIX) 80 MG tablet Take 80 mg by mouth daily.   Yes Historical Provider, MD  HYDROcodone-acetaminophen (NORCO) 10-325 MG per tablet Take 1 tablet by mouth every 6 (six) hours as needed for severe pain. 02/14/15  Yes Stark Klein, MD  levothyroxine (SYNTHROID, LEVOTHROID) 25 MCG tablet Take 25 mcg by mouth daily before breakfast.  01/23/15  Yes Historical Provider, MD  metolazone (ZAROXOLYN) 2.5 MG tablet Take 2.5 mg by mouth daily.    Yes Historical Provider, MD  metoprolol (TOPROL-XL) 100 MG 24 hr tablet Take 100 mg by mouth daily.     Yes  Historical Provider, MD  nitroGLYCERIN (NITROSTAT) 0.4 MG SL tablet Place 0.4 mg under the tongue every 5 (five) minutes as needed for chest pain (MAX 3 TABLETS).    Yes Historical Provider, MD  omeprazole (PRILOSEC) 40 MG capsule Take 40 mg by mouth daily.   Yes Historical Provider, MD  potassium chloride (KLOR-CON) 8 MEQ tablet Take 5 tablets by mouth in the am, 5 tablets at lunch, 5 tablets in the evening and 5 tablets at night.   Yes Historical Provider, MD  pravastatin (PRAVACHOL) 20 MG tablet Take 20 mg by mouth daily.  01/12/15  Yes Historical Provider, MD  warfarin (COUMADIN) 5 MG tablet Take 2.5-5 mg by mouth daily at 6 PM. TAKES '5MG'$  ON WED, THURS AND FRI ONLY TAKES 2.'5MG'$  ALL OTHER DAYS  Yes Historical Provider, MD   BP 100/47 mmHg  Pulse 63  Temp(Src) 97.9 F (36.6 C) (Oral)  Resp 14  Ht '5\' 7"'$  (1.702 m)  Wt 241 lb 1.6 oz (109.362 kg)  BMI 37.75 kg/m2  SpO2 96% Physical Exam  Constitutional: She appears well-developed.  Neck: Neck supple.  Cardiovascular: Normal rate.   Pulmonary/Chest: Effort normal.  Abdominal: Soft. There is no tenderness.  Musculoskeletal: She exhibits no edema.  Neurological: She is alert.  Skin: Skin is warm.    ED Course  Procedures (including critical care time) Labs Review Labs Reviewed  PROTIME-INR - Abnormal; Notable for the following:    Prothrombin Time 27.6 (*)    INR 2.62 (*)    All other components within normal limits  CBC WITH DIFFERENTIAL/PLATELET - Abnormal; Notable for the following:    WBC 10.8 (*)    Neutro Abs 8.3 (*)    All other components within normal limits  BRAIN NATRIURETIC PEPTIDE - Abnormal; Notable for the following:    B Natriuretic Peptide 566.0 (*)    All other components within normal limits  I-STAT CHEM 8, ED - Abnormal; Notable for the following:    Potassium 3.4 (*)    Chloride 95 (*)    BUN 28 (*)    Creatinine, Ser 1.20 (*)    Calcium, Ion 1.05 (*)    All other components within normal limits  I-STAT  TROPOININ, ED    Imaging Review Dg Chest Portable 1 View  03/02/2016  CLINICAL DATA:  Shortness of breath with chest pain. History of COPD and CHF. EXAM: PORTABLE CHEST 1 VIEW COMPARISON:  08/09/2006 FINDINGS: Dual lead pacemaker remains in place. The cardiac silhouette appears mildly enlarged. There is mild pulmonary vascular congestion with new perihilar and bibasilar lung opacities. No sizable pleural effusion or pneumothorax is identified. No acute osseous abnormality is identified. IMPRESSION: Cardiomegaly with mild perihilar and bibasilar opacities suspicious for edema. Electronically Signed   By: Logan Bores M.D.   On: 03/02/2016 16:10   I have personally reviewed and evaluated these images and lab results as part of my medical decision-making.   EKG Interpretation   Date/Time:  Friday March 02 2016 15:42:25 EDT Ventricular Rate:  65 PR Interval:    QRS Duration: 163 QT Interval:  521 QTC Calculation: 542 R Axis:   -80 Text Interpretation:  Atrial fibrillation with vetricular pacing Confirmed  by Micajah Dennin  MD, Tesha Archambeau [id number]) on 03/02/2016 3:48:29 PM      EKG Interpretation  Date/Time:  Friday March 02 2016 17:51:41 EDT Ventricular Rate:  65 PR Interval:    QRS Duration: 160 QT Interval:  578 QTC Calculation: 601 R Axis:   -79 Text Interpretation:  Atrial fibrillation with ventricular pacing Confirmed by Laurissa Cowper  MD, Ovid Curd [phone]) on 03/02/2016 5:58:49 PM        MDM   Final diagnoses:  Congestive heart failure, unspecified congestive heart failure chronicity, unspecified congestive heart failure type ([place])  Chest pain, unspecified chest pain type    Patient presents with shortness of breath over the last couple days. Also has episodes of chest pressure. Chest pressure had been improved by nitroglycerin. Still having some pain. Patient is currently paced her pacemaker. Has reached end of battery life. Has a pain since 2 days ago and still has negative troponin.  Will admit to internal medicine. Patient is put on 4 pounds in 2 days.    Davonna Belling, MD 03/02/16 [phone]

## 2016-03-02 NOTE — H&P (Signed)
Date: 03/02/2016               Patient Name:  Traci [name] MRN: [id number]  DOB: Dec 26, 1931 Age / Sex: 82 y.o., female   PCP: Raelene Bott, MD         Medical Service: Internal Medicine Teaching Service         Attending Physician: Dr. Axel Filler, MD    First Contact: Dr. Berline Lopes Pager: [phone]  Second Contact: Dr. Jacques Earthly Pager: [phone]       After Hours (After 5p/  First Contact Pager: [phone]  weekends / holidays): Second Contact Pager: [phone]   Chief Complaint: Shortness of Breath/Chest Pain  History of Present Illness: Traci [name] is an 82 year old female with a past medical history of non-ischemic diastolic congestive heart failure, paroxsymal atrial fibrillation on warfarin, symptomatic bradycardia s/p pacemaker placement, HTN, diet controlled DM II, breast cancer s/p lumpectomy, hypothyroidism, obesity, HLD, OSA presenting with complaints of shortness of breath and chest pain. She reports that her weight has been going up for the past 4 days at home. Reports her weight is usually 232-234 lbs and was 238 when she checked this morning. States she has had decreased urine output over this period as well despite taking her lasix and metolazone as prescribed. She also reports shortness of breath with minimal exertion. States she cannot walk 5 feet without becoming short of breath. She reports she is usually able to walk at [facility] without becoming short of breath. Notes that she had an episode of non-radiating substernal chest pressure 2 nights ago with exertion. She took sub-lingual nitro with resolution of the chest pain. She went to see her PCP this afternoon and became short of breath and developed non-radiating substernal chest pressure and diaphoresis when walking from her car to the office. States her symptoms improved with rest and sublingual nitro. Denies any lightheadedness, dizziness, falls, fevers, chills, palpitations, vision changes, leg edema, nausea,  vomiting or abdominal pain. She does report a headache that developed after taking the nitro as well as a non-productive cough for the past month. Denies any further chest pain since coming to the hospital.   Meds: No current facility-administered medications for this encounter.   Current Outpatient Prescriptions  Medication Sig Dispense Refill  . allopurinol (ZYLOPRIM) 300 MG tablet Take 300 mg by mouth daily.    Marland Kitchen alum hydroxide-mag trisilicate (GAVISCON) 75-91 MG CHEW chewable tablet Chew 1 tablet by mouth 4 (four) times daily as needed for indigestion or heartburn.    Marland Kitchen amiodarone (PACERONE) 200 MG tablet Take 1 tablet (200 mg total) by mouth daily. 30 tablet 4  . anastrozole (ARIMIDEX) 1 MG tablet Take 1 tablet (1 mg total) by mouth daily. 90 tablet 3  . bismuth subsalicylate (PEPTO BISMOL) 262 MG/15ML suspension Take 30 mLs by mouth every 6 (six) hours as needed.    . diltiazem (CARDIZEM CD) 180 MG 24 hr capsule TAKE ONE CAPSULE BY MOUTH ONCE DAILY    . diphenhydramine-acetaminophen (TYLENOL PM) 25-500 MG TABS tablet Take 1 tablet by mouth at bedtime as needed.    . doxazosin (CARDURA) 2 MG tablet Take 2 mg by mouth every morning.     . furosemide (LASIX) 80 MG tablet Take 80 mg by mouth daily.    Marland Kitchen HYDROcodone-acetaminophen (NORCO) 10-325 MG per tablet Take 1 tablet by mouth every 6 (six) hours as needed for severe pain. 20 tablet 0  . levothyroxine (SYNTHROID, LEVOTHROID) 25 MCG  tablet Take 25 mcg by mouth daily before breakfast.   5  . metolazone (ZAROXOLYN) 2.5 MG tablet Take 2.5 mg by mouth daily.     . metoprolol (TOPROL-XL) 100 MG 24 hr tablet Take 100 mg by mouth daily.      . nitroGLYCERIN (NITROSTAT) 0.4 MG SL tablet Place 0.4 mg under the tongue every 5 (five) minutes as needed for chest pain (MAX 3 TABLETS).     Marland Kitchen omeprazole (PRILOSEC) 40 MG capsule Take 40 mg by mouth daily.    . potassium chloride (KLOR-CON) 8 MEQ tablet Take 5 tablets by mouth in the am, 5 tablets at lunch,  5 tablets in the evening and 5 tablets at night.    . pravastatin (PRAVACHOL) 20 MG tablet Take 20 mg by mouth daily.   3  . warfarin (COUMADIN) 5 MG tablet Take 2.5-5 mg by mouth daily at 6 PM. TAKES '5MG'$  ON WED, THURS AND FRI ONLY TAKES 2.'5MG'$  ALL OTHER DAYS      Allergies: Allergies as of 03/02/2016 - Review Complete 03/02/2016  Allergen Reaction Noted  . Penicillins Swelling and Other (See Comments)   . Codeine Other (See Comments)   . Shellfish allergy Hives and Itching 09/02/2012  . Sulfonamide derivatives Other (See Comments)    Past Medical History  Diagnosis Date  . DOE (dyspnea on exertion)   . COPD (chronic obstructive pulmonary disease) ([place])   . Anxiety   . CHF (congestive heart failure) ([place])   . Diverticulitis of colon   . Paroxysmal atrial fibrillation (HCC)   . HTN (hypertension)   . Obesity   . Hyperlipidemia   . DM (diabetes mellitus) ([place])   . Full dentures   . Wears glasses   . OSA (obstructive sleep apnea)     has a cpap-cannot use  . GERD (gastroesophageal reflux disease)   . Arthritis   . History of DVT (deep vein thrombosis)   . Presence of permanent cardiac pacemaker   . Hypothyroidism   . Cancer [facility])     right breast cancer   Past Surgical History  Procedure Laterality Date  . Abdominal hysterectomy    . C-spine surgery  1992    cervical disk  . Lower back surgery  1980  . Esophagogastroduodenoscopy    . Dilation and curettage of uterus    . Cardiac catheterization  2005  . Pacemaker insertion  2007  . Colonoscopy    . Breast lumpectomy with radioactive seed localization Right 02/08/2015    Procedure: BREAST LUMPECTOMY WITH RADIOACTIVE SEED LOCALIZATION;  Surgeon: Stark Klein, MD;  Location: [place];  Service: General;  Laterality: Right;  . Re-excision of breast lumpectomy Right 02/14/2015    Procedure: RE-EXCISION OF RIGHT BREAST LUMPECTOMY;  Surgeon: Stark Klein, MD;  Location: [place];  Service:  General;  Laterality: Right;  . Cholecystectomy    . Back surgery     Family History  Problem Relation Age of Onset  . Heart failure Mother   . Pancreatic cancer Father 55  . Vaginal cancer Sister 59  . Hodgkin's lymphoma Sister 78  . Lung cancer Sister 98   Social History   Social History  . Marital Status: Married    Spouse Name: N/A  . Number of Children: N/A  . Years of Education: N/A   Occupational History  . Retired from [employer]  . Smoking status: Former Smoker -- 1.00 packs/day for  50 years    Quit date: 12/10/1996  . Smokeless tobacco: Not on file     Comment: gerater than 50 pack year history of tobacco-quit in 1998  . Alcohol Use: No  . Drug Use: No  . Sexual Activity: Yes   Other Topics Concern  . Not on file   Social History Narrative   Review of Systems: Pertinent items noted in HPI and remainder of comprehensive ROS otherwise negative.  Physical Exam: Blood pressure 107/61, pulse 64, temperature 97.9 F (36.6 C), temperature source Oral, resp. rate 12, height '5\' 7"'$  (1.702 m), weight 241 lb 1.6 oz (109.362 kg), SpO2 96 %. General: alert, obese female in no acute distress and cooperative to examination.  Head: normocephalic and atraumatic.  Eyes: vision grossly intact, pupils equal, pupils round, pupils reactive to light, no injection and anicteric.  Mouth: pharynx pink and moist, no erythema, and no exudates.  Neck: supple, full ROM, no thyromegaly, +JVD, and no carotid bruits.  Lungs: normal respiratory effort, no accessory muscle use, normal breath sounds, bibsilar crackles, and no wheezes. Heart: normal rate, regular rhythm, no murmur, no gallop, and no rub. Tenderness to palpation over sternum.  Abdomen: obese abdomen, soft, non-tender, normal bowel sounds, no distention, no guarding, no rebound tenderness Msk: no joint swelling, no joint warmth, and no redness over joints.  Pulses: 2+ DP/PT pulses  bilaterally Extremities: trace pedal edema Neurologic: alert & oriented X3, cranial nerves II-XII intact, no focal deficits Skin: turgor normal and no rashes.  Psych: normal mood and affect   Lab results: Basic Metabolic Panel:  Recent Labs  03/02/16 1610  NA 138  K 3.4*  CL 95*  GLUCOSE 96  BUN 28*  CREATININE 1.20*   CBC:  Recent Labs  03/02/16 1600 03/02/16 1610  WBC 10.8*  --   NEUTROABS 8.3*  --   HGB 12.1 14.3  HCT 37.8 42.0  MCV 91.1  --   PLT 253  --    Coagulation:  Recent Labs  03/02/16 1600  LABPROT 27.6*  INR 2.62*   Imaging results:  Dg Chest Portable 1 View  03/02/2016  CLINICAL DATA:  Shortness of breath with chest pain. History of COPD and CHF. EXAM: PORTABLE CHEST 1 VIEW COMPARISON:  08/09/2006 FINDINGS: Dual lead pacemaker remains in place. The cardiac silhouette appears mildly enlarged. There is mild pulmonary vascular congestion with new perihilar and bibasilar lung opacities. No sizable pleural effusion or pneumothorax is identified. No acute osseous abnormality is identified. IMPRESSION: Cardiomegaly with mild perihilar and bibasilar opacities suspicious for edema. Electronically Signed   By: Logan Bores M.D.   On: 03/02/2016 16:10   Other results: RCV:ELFYBO fibrillation with ventricular pacing   Assessment & Plan by Problem:  HFpEF Exacerbation: Patient with 4 day history of decreasing urinary output, weight gain and dyspnea with exertion. Pateint reports normal weight at home is 232-234lbs and has been increasing over the past 4 days. Weight is 241 lbs here. She does note that she has been having decreased urine output over this time. Reports compliance with her home Lasix 80 mg daily and metolazone 2.5 mg daily. Denies any dietary indiscresion, no increased salt intake. Pro-BNP was elevated at 1600 at PCP's office and BNP 560 in the ED. CXR does demonstrate perihilar and bibasilar opacities suspicious for edema. Only trace pedal edema on  exam but patient does have JVD present as well as bibasilar crackles. No ECHO on file but patient does have a history of non-ischemic  diastolic congestive heart failure listed. No leg edema, no extended travel or recent surgeries. Does have a history of breast cancer that was treated a year ago. Well Score 1.5. She is on warfarin with INR 2.6 today. Low suspicion for DVT/PE.  -Lasix 40 mg IV bid -Metolazone 2.5 mg daily -ECHO -Strict I&O, daily weights -BMP in am  Chest Pain: Patient reports episode of substernal chest pressure Wednesday night and again today. The pain was non-radiating, worsened with exertion and improved with rest and nitro. Initial troponin negative. EKG with ventricular pacing, no ST elevations, T-wave inversions or Q-waves. Patient does have tenderness to palpation over the sternum on exam. Denies any trauma or falls. Given patient's history will consult cardiology in the morning and keep patient NPO at midnight for possible stress test.  -Telemetry -Trend Troponin -EKG in am -NPO at midnight for possible stress test tomorrow -Consult cards in am  Paroxsymal Atrial Fibrillation: HR 60s and regular. Patient is currently on amiodarone, diltiazem, metoprolol and warfarin for PAF. INR 2.6 on admission. CHAD-VASc score 6. Reports compliance with her medications.  -Continue amiodarone 200 mg daily -Continue Diltiazem 240 mg daily -Continue Metorpolol 100 mg daily -Warfarin per pharmacy  HTN: Patient is on amiodarone, diltiazem, metoprolol and doxazosin for HTN at home. BP has been soft in the ED in the 86V systolic. -Holding doxazosin -Continue amio, dilt and metoprolol  DM II: Diet controlled. Last A1c 6.1 on 12/29/15.  -CBGs qac and qhs  CKD III: Cr 1.20 on admission. Baseline 1-1.26 with GFR 40-50. Will monitor for any increase in Cr with aggressive diuretics.  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  -TSH -Continue Synthroid 25 mcg daily  OSA: Patient reports she cannot tolerate CPAP and does not use it at home. Declined to have it while inpatient.   DVT PPx: Warfarin  Code Status: Full  Dispo: Disposition is deferred at this time, awaiting improvement of current medical problems. Anticipated discharge in approximately 2-3 day(s).   The patient does have a current PCP Raelene Bott, MD) and does need an [facility] hospital follow-up appointment after discharge.  The patient does not have transportation limitations that hinder transportation to clinic appointments.  Signed: Maryellen Pile, MD 03/02/2016, 7:51 PM

## 2016-03-02 NOTE — ED Notes (Signed)
MD at bedside. 

## 2016-03-02 NOTE — ED Notes (Signed)
Pt reports increased SOB beginning Wednesday pm with CP.  Pt reports using 1 NTG with relief Wednesday pm.  Pt reports sending in transmission of pacemaker yesterday and being told by MD "pacemaker battery dead."  Pt reports increased SOB with exertion today with CP starting around 1345.  EMS reports pt received:  325 ASA 1 NTG, with relief  Pt states "chest discomfort" at this time, L sided.

## 2016-03-03 ENCOUNTER — Inpatient Hospital Stay (HOSPITAL_COMMUNITY): Payer: Medicare Other

## 2016-03-03 DIAGNOSIS — R079 Chest pain, unspecified: Secondary | ICD-10-CM | POA: Diagnosis present

## 2016-03-03 DIAGNOSIS — I5033 Acute on chronic diastolic (congestive) heart failure: Secondary | ICD-10-CM

## 2016-03-03 DIAGNOSIS — I480 Paroxysmal atrial fibrillation: Secondary | ICD-10-CM

## 2016-03-03 LAB — PROTIME-INR
INR: 2.6 — ABNORMAL HIGH (ref 0.00–1.49)
PROTHROMBIN TIME: 27.5 s — AB (ref 11.6–15.2)

## 2016-03-03 LAB — BASIC METABOLIC PANEL
ANION GAP: 13 (ref 5–15)
BUN: 26 mg/dL — ABNORMAL HIGH (ref 6–20)
CO2: 28 mmol/L (ref 22–32)
Calcium: 9 mg/dL (ref 8.9–10.3)
Chloride: 101 mmol/L (ref 101–111)
Creatinine, Ser: 1.16 mg/dL — ABNORMAL HIGH (ref 0.44–1.00)
GFR calc Af Amer: 49 mL/min — ABNORMAL LOW (ref >60)
GFR, EST NON AFRICAN AMERICAN: 43 mL/min — AB (ref >60)
Glucose, Bld: 116 mg/dL — ABNORMAL HIGH (ref 65–99)
POTASSIUM: 3.3 mmol/L — AB (ref 3.5–5.1)
SODIUM: 142 mmol/L (ref 135–145)

## 2016-03-03 LAB — TROPONIN I: Troponin I: <0.03 ng/mL (ref <0.031)

## 2016-03-03 LAB — ECHOCARDIOGRAM COMPLETE
Height: 66 in
Weight: 3806.4 oz

## 2016-03-03 LAB — GLUCOSE, CAPILLARY: Glucose-Capillary: 111 mg/dL — ABNORMAL HIGH (ref 65–99)

## 2016-03-03 MED ORDER — WARFARIN SODIUM 2.5 MG PO TABS
2.5000 mg | ORAL_TABLET | Freq: Once | ORAL | Status: AC
  Administered 2016-03-03: 2.5 mg via ORAL
  Filled 2016-03-03: qty 1

## 2016-03-03 MED ORDER — FUROSEMIDE 10 MG/ML IJ SOLN
40.0000 mg | Freq: Two times a day (BID) | INTRAMUSCULAR | Status: DC
  Administered 2016-03-03 – 2016-03-04 (×2): 40 mg via INTRAVENOUS
  Filled 2016-03-03 (×2): qty 4

## 2016-03-03 MED ORDER — ZOLPIDEM TARTRATE 5 MG PO TABS
5.0000 mg | ORAL_TABLET | Freq: Every evening | ORAL | Status: DC | PRN
  Administered 2016-03-03 – 2016-03-04 (×2): 5 mg via ORAL
  Filled 2016-03-03 (×2): qty 1

## 2016-03-03 MED ORDER — FUROSEMIDE 10 MG/ML IJ SOLN: 80.0000 mg | Freq: Two times a day (BID) | INTRAMUSCULAR | Status: DC

## 2016-03-03 MED ORDER — POTASSIUM CHLORIDE CRYS ER 20 MEQ PO TBCR
40.0000 meq | EXTENDED_RELEASE_TABLET | Freq: Two times a day (BID) | ORAL | Status: AC
  Administered 2016-03-03 (×2): 40 meq via ORAL
  Filled 2016-03-03: qty 2

## 2016-03-03 MED ORDER — METOLAZONE 5 MG PO TABS
2.5000 mg | ORAL_TABLET | Freq: Every day | ORAL | Status: DC
  Administered 2016-03-03 – 2016-03-04 (×2): 2.5 mg via ORAL
  Filled 2016-03-03 (×3): qty 1

## 2016-03-03 MED ORDER — ALUM & MAG HYDROXIDE-SIMETH 200-200-20 MG/5ML PO SUSP
30.0000 mL | ORAL | Status: AC | PRN
  Administered 2016-03-03: 30 mL via ORAL
  Filled 2016-03-03: qty 30

## 2016-03-03 MED ORDER — FUROSEMIDE 10 MG/ML IJ SOLN
40.0000 mg | Freq: Once | INTRAMUSCULAR | Status: DC
  Filled 2016-03-03: qty 4

## 2016-03-03 NOTE — Progress Notes (Signed)
  Echocardiogram 2D Echocardiogram has been performed.  Traci [name] 03/03/2016, 3:08 PM

## 2016-03-03 NOTE — Progress Notes (Addendum)
ANTICOAGULATION CONSULT NOTE - Follow-up Consult  Pharmacy Consult for coumadin Indication: atrial fibrillation  Allergies  Allergen Reactions  . Penicillins Swelling and Other (See Comments)    "Felt like brain was swelling out of her head"  . Codeine Other (See Comments)    Stomach issues  . Shellfish Allergy Hives and Itching  . Sulfonamide Derivatives Other (See Comments)    Immune to this    Patient Measurements: Height: '5\' 6"'$  (167.6 cm) Weight: 237 lb 14.4 oz (107.911 kg) IBW/kg (Calculated) : 59.3  Vital Signs: Temp: 98.8 F (37.1 C) (03/25 0457) Temp Source: Oral (03/25 0457) BP: 98/69 mmHg (03/25 0812) Pulse Rate: 64 (03/25 0457)  Labs:  Recent Labs  03/02/16 1600 03/02/16 1610 03/02/16 2131 03/03/16 0627  HGB 12.1 14.3  --   --   HCT 37.8 42.0  --   --   PLT 253  --   --   --   LABPROT 27.6*  --   --  27.5*  INR 2.62*  --   --  2.60*  CREATININE  --  1.20* 1.26* 1.16*  TROPONINI  --   --  <0.03 <0.03    Estimated Creatinine Clearance: 46.5 mL/min (by C-G formula based on Cr of 1.16).   Medical History: Past Medical History  Diagnosis Date  . DOE (dyspnea on exertion)   . COPD (chronic obstructive pulmonary disease) ([place])   . Anxiety   . CHF (congestive heart failure) ([place])   . Diverticulitis of colon   . Paroxysmal atrial fibrillation (HCC)   . HTN (hypertension)   . Obesity   . Hyperlipidemia   . DM (diabetes mellitus) ([place])   . Full dentures   . Wears glasses   . OSA (obstructive sleep apnea)     has a cpap-cannot use  . GERD (gastroesophageal reflux disease)   . Arthritis   . History of DVT (deep vein thrombosis)   . Presence of permanent cardiac pacemaker   . Hypothyroidism   . Cancer [facility])     right breast cancer   Assessment: 43 YOF on coumadin PTA for afib, presented with SOB/CP, pharmacy is consulted to manage coumadin dosing. INR 2.60, therapeutic. Continue home dose amiodarone.   PTA dose: '5mg'$  on Wed/Thu/Fri, 2.5 mg all  other days, last dose reported PTA 3/23  Goal of Therapy:  INR 2-3 Monitor platelets by anticoagulation protocol: Yes   Plan:  - Coumadin 2.5 mg po x 1 tonight - Daily PT/INR  Darl Pikes, PharmD Clinical Pharmacist- Resident Pager: [phone]  03/03/2016,8:36 AM

## 2016-03-03 NOTE — Progress Notes (Signed)
ANTICOAGULATION CONSULT NOTE - Follow Up Consult  Pharmacy Consult for Heparin Indication: atrial fibrillation  Allergies  Allergen Reactions  . Penicillins Swelling and Other (See Comments)    "Felt like brain was swelling out of her head"  . Codeine Other (See Comments)    Stomach issues  . Shellfish Allergy Hives and Itching  . Sulfonamide Derivatives Other (See Comments)    Immune to this    Patient Measurements: Height: '5\' 6"'$  (167.6 cm) Weight: 237 lb 14.4 oz (107.911 kg) IBW/kg (Calculated) : 59.3 Heparin Dosing Weight: 84 kg  Vital Signs: Temp: 97.9 F (36.6 C) (03/25 1950) Temp Source: Oral (03/25 1950) BP: 109/53 mmHg (03/25 1950) Pulse Rate: 67 (03/25 1950)  Labs:  Recent Labs  03/02/16 1600 03/02/16 1610 03/02/16 2131 03/03/16 0627 03/03/16 0829  HGB 12.1 14.3  --   --   --   HCT 37.8 42.0  --   --   --   PLT 253  --   --   --   --   LABPROT 27.6*  --   --  27.5*  --   INR 2.62*  --   --  2.60*  --   CREATININE  --  1.20* 1.26* 1.16*  --   TROPONINI  --   --  <0.03 <0.03 <0.03    Estimated Creatinine Clearance: 46.5 mL/min (by C-G formula based on Cr of 1.16).   Medications:  Coumadin 2.'5mg'$  given this evening  Assessment: 82 year old female who now needs a pacemaker replacement. She is on chronic anticoagulation with Coumadin for atrial fibrillation and her INR is therapeutic. Coumadin has been discontinued and a request received for Heparin when INR is <2. Currently her INR is 2.6.  Goal of Therapy:  Heparin level 0.3-0.7 units/ml Monitor platelets by anticoagulation protocol: Yes   Plan:  Check INR daily with AM labs Start Heparin infusion once INR is <2  Legrand Como, Pharm.D., BCPS, AAHIVP Clinical Pharmacist Phone: [phone] or [phone] 03/03/2016, 8:45 PM

## 2016-03-03 NOTE — Consult Note (Signed)
CARDIOLOGY CONSULT NOTE       Patient ID: Traci [name] MRN: [id number] DOB/AGE: [birth year] 82 y.o.  Admit date: 03/02/2016 Referring Physician:  Evette Doffing Primary Physician: Raelene Bott, MD Primary Cardiologist:  Lovena Le Reason for Consultation:  CHF  Principal Problem:   CHF exacerbation [facility]) Active Problems:   DM (diabetes mellitus) type II controlled with renal manifestation ([place])   Hyperlipemia   Obstructive sleep apnea   Essential hypertension   PAROXYSMAL ATRIAL FIBRILLATION   COPD (chronic obstructive pulmonary disease) ([place])   Breast cancer of upper-outer quadrant of right female breast ([place])   Shortness of breath   Pain in the chest   HPI:  82 y.o. obese female with history of PAF , SSS wit [employer] dual chamber Adaptive pacer on coumadin.  Admitted with increasing dyspnea for the last few days.  On admission CXR with mild cephalization and BNP mildly elevated in 500 range.  Has some epigastric pain radiates to chest.  She indicates that she transmitted from home before admission and someone told her her pacer battery was dead. Last check in office March 30, 2023 indicated <1-10 months She is not pacer dependant.  She has no history of CAD.  Troponins negative  On telemetry review she seems to be V pacing at 65 and then when not pacing is in NSR with visible P waves.  INR Rx on admission Has been compliant with her meds.  On amiodarone for rhythm and getting diuresed by primary service No URI, cough sputum of fever.  Positive exertional dyspnea and PND.    ROS All other systems reviewed and negative except as noted above  Past Medical History  Diagnosis Date  . DOE (dyspnea on exertion)   . COPD (chronic obstructive pulmonary disease) ([place])   . Anxiety   . CHF (congestive heart failure) ([place])   . Diverticulitis of colon   . Paroxysmal atrial fibrillation (HCC)   . HTN (hypertension)   . Obesity   . Hyperlipidemia   . DM (diabetes mellitus) ([place])   . Full dentures   .  Wears glasses   . OSA (obstructive sleep apnea)     has a cpap-cannot use  . GERD (gastroesophageal reflux disease)   . Arthritis   . History of DVT (deep vein thrombosis)   . Presence of permanent cardiac pacemaker   . Hypothyroidism   . Cancer [facility])     right breast cancer    Family History  Problem Relation Age of Onset  . Heart failure Mother   . Pancreatic cancer Father 41  . Vaginal cancer Sister 36  . Hodgkin's lymphoma Sister 17  . Lung cancer Sister 82    Social History   Social History  . Marital Status: Married    Spouse Name: N/A  . Number of Children: N/A  . Years of Education: N/A   Occupational History  . Retired from [employer]  . Smoking status: Former Smoker -- 1.00 packs/day for 50 years    Quit date: 12/10/1996  . Smokeless tobacco: Not on file     Comment: gerater than 50 pack year history of tobacco-quit in 1998  . Alcohol Use: No  . Drug Use: No  . Sexual Activity: Yes   Other Topics Concern  . Not on file   Social History Narrative    Past Surgical History  Procedure Laterality Date  . Abdominal hysterectomy    . C-spine surgery  1992  cervical disk  . Lower back surgery  1980  . Esophagogastroduodenoscopy    . Dilation and curettage of uterus    . Cardiac catheterization  2005  . Pacemaker insertion  2007  . Colonoscopy    . Breast lumpectomy with radioactive seed localization Right 02/08/2015    Procedure: BREAST LUMPECTOMY WITH RADIOACTIVE SEED LOCALIZATION;  Surgeon: Stark Klein, MD;  Location: [place];  Service: General;  Laterality: Right;  . Re-excision of breast lumpectomy Right 02/14/2015    Procedure: RE-EXCISION OF RIGHT BREAST LUMPECTOMY;  Surgeon: Stark Klein, MD;  Location: [place];  Service: General;  Laterality: Right;  . Cholecystectomy    . Back surgery       . allopurinol  300 mg Oral Daily  . amiodarone  200 mg Oral Daily  . anastrozole  1  mg Oral Daily  . diltiazem  240 mg Oral Daily  . furosemide  40 mg Intravenous Once  . furosemide  40 mg Intravenous BID  . levothyroxine  25 mcg Oral QAC breakfast  . metolazone  2.5 mg Oral Daily  . metoprolol succinate  100 mg Oral Daily  . pantoprazole  40 mg Oral Daily  . potassium chloride  40 mEq Oral BID  . pravastatin  20 mg Oral QHS  . warfarin  2.5 mg Oral ONCE-1800  . Warfarin - Pharmacist Dosing Inpatient   Does not apply q1800      Physical Exam: Blood pressure 98/69, pulse 64, temperature 98.8 F (37.1 C), temperature source Oral, resp. rate 22, height '5\' 6"'$  (1.676 m), weight 107.911 kg (237 lb 14.4 oz), SpO2 97 %.    Affect appropriate Obese white female  HEENT: normal Neck supple with no adenopathy JVP normal no bruits no thyromegaly Lungs clear anterior ly  Heart:  S1/S2 no murmur, no rub, gallop or click pacer under left clavicle  PMI normal Abdomen: benighn, BS positve, no tenderness, no AAA no bruit.  No HSM or HJR Distal pulses intact with no bruits Trace bilateral edema Neuro non-focal Skin warm and dry No muscular weakness   Labs:   Lab Results  Component Value Date   WBC 10.8* 03/02/2016   HGB 14.3 03/02/2016   HCT 42.0 03/02/2016   MCV 91.1 03/02/2016   PLT 253 03/02/2016    Recent Labs Lab 03/02/16 2131 03/03/16 0627  NA 140 142  K 3.5 3.3*  CL 99* 101  CO2 28 28  BUN 27* 26*  CREATININE 1.26* 1.16*  CALCIUM 8.8* 9.0  PROT 6.1*  --   BILITOT 0.8  --   ALKPHOS 61  --   ALT 27  --   AST 38  --   GLUCOSE 141* 116*   Lab Results  Component Value Date   TROPONINI <0.03 03/03/2016   No results found for: CHOL No results found for: HDL No results found for: LDLCALC No results found for: TRIG No results found for: CHOLHDL No results found for: LDLDIRECT    Radiology: Dg Chest Portable 1 View  03/02/2016  CLINICAL DATA:  Shortness of breath with chest pain. History of COPD and CHF. EXAM: PORTABLE CHEST 1 VIEW COMPARISON:   08/09/2006 FINDINGS: Dual lead pacemaker remains in place. The cardiac silhouette appears mildly enlarged. There is mild pulmonary vascular congestion with new perihilar and bibasilar lung opacities. No sizable pleural effusion or pneumothorax is identified. No acute osseous abnormality is identified. IMPRESSION: Cardiomegaly with mild perihilar and bibasilar opacities suspicious for edema.  Electronically Signed   By: Logan Bores M.D.   On: 03/02/2016 16:10    EKG:  Mild cephalization pacer leads in good position    ASSESSMENT AND PLAN:  CHF:  Etiology not clear but symptoms disproportionate to physical findings  Echo pending . Continue diuresis.   Pacer:  Have called [employer] to interrogate pacer Wilfred Lacy indicated he has a case in [place] and will not be at [facility] for a few hours Need to know battery status and if mode has changed to VVI and what her underlying rhythm is.   PAF:  Continue coumadin and amiodarone  Chest Pain:  R/o pending review of pacer and echo will decide on myovue or cath.    Jenkins Rouge   Signed: Jenkins Rouge 03/03/2016, 2:10 PM

## 2016-03-03 NOTE — Progress Notes (Signed)
Report received via Denice Paradise RN in patient's room using SBAR format, updated on VS, meds, new orders and today's events, assumed care of patient.

## 2016-03-03 NOTE — Progress Notes (Signed)
Patient to 3W-28 via stretcher in stable condition, got her into her bed after she was weighed via standing scale. Patient had a room full of visitors and the MD was in to do her admission assessment and orders, B/P was low on admission and he was made aware of it while in the room. Patient denies bring lightheaded, dizzy or nauseated at this time, will continue to monitor. Telemetry box number with name, birthdate and ss number called down and was verified by Mindy Control and instrumentation engineer). Patient's dkin was examined and verified by MD and Sam (charge RN), will get admitted and continue to monitor. Patient will get her dinner tray and will be NPO after midnight till seen by Cardiologist in the am. Admission teaching done and patient verbalized understanding.

## 2016-03-03 NOTE — Progress Notes (Signed)
Subjective: Patient was seen and examined at bedside this morning. Reports still feeling SOB. Denies having any chest pain but does report having epigastric pain. No other complaints.   Objective: Vital signs in last 24 hours: Filed Vitals:   03/02/16 2030 03/03/16 0000 03/03/16 0457 03/03/16 0812  BP: '95/48 99/43 96/50 '$ 98/69  Pulse:  65 64   Temp: 98.4 F (36.9 C) 99.1 F (37.3 C) 98.8 F (37.1 C)   TempSrc: Oral Oral Oral   Resp: '18 20 22   '$ Height: '5\' 6"'$  (1.676 m)     Weight: 108.455 kg (239 lb 1.6 oz)  107.911 kg (237 lb 14.4 oz)   SpO2: 99% 99% 97% 97%   Weight change:   Intake/Output Summary (Last 24 hours) at 03/03/16 1224 Last data filed at 03/03/16 1100  Gross per 24 hour  Intake    600 ml  Output   2100 ml  Net  -1500 ml    Physical Exam: General: alert, obese female in no acute distress  Neck: supple, full ROM, no thyromegaly, +JVD, and no carotid bruits.  Lungs: normal respiratory effort, no accessory muscle use, normal breath sounds, bibsilar crackles, and no wheezes. Heart: normal rate, regular rhythm, no murmur, no gallop, and no rub.  Abdomen: obese abdomen, soft, mild epigastric abdominal tenderness, normal bowel sounds, no distention, no guarding, no rebound tenderness Msk: no joint swelling, no joint warmth, and no redness over joints.  Pulses: 2+ DP/PT pulses bilaterally Extremities: trace pedal edema Neurologic: alert & oriented X3 Psych: normal mood and affect  Lab Results: Basic Metabolic Panel:  Recent Labs Lab 03/02/16 2131 03/03/16 0627  NA 140 142  K 3.5 3.3*  CL 99* 101  CO2 28 28  GLUCOSE 141* 116*  BUN 27* 26*  CREATININE 1.26* 1.16*  CALCIUM 8.8* 9.0  MG 1.8  --    Liver Function Tests:  Recent Labs Lab 03/02/16 2131  AST 38  ALT 27  ALKPHOS 61  BILITOT 0.8  PROT 6.1*  ALBUMIN 3.4*    Recent Labs Lab 03/02/16 2131  LIPASE 37   CBC:  Recent Labs Lab 03/02/16 1600 03/02/16 1610  WBC 10.8*  --     NEUTROABS 8.3*  --   HGB 12.1 14.3  HCT 37.8 42.0  MCV 91.1  --   PLT 253  --    Cardiac Enzymes:  Recent Labs Lab 03/02/16 2131 03/03/16 0627 03/03/16 0829  TROPONINI <0.03 <0.03 <0.03   CBG:  Recent Labs Lab 03/02/16 2127  GLUCAP 141*   Thyroid Function Tests:  Recent Labs Lab 03/02/16 2131  TSH 4.110   Coagulation:  Recent Labs Lab 03/02/16 1600 03/03/16 0627  LABPROT 27.6* 27.5*  INR 2.62* 2.60*   Micro Results: No results found for this or any previous visit (from the past 240 hour(s)). Studies/Results: Dg Chest Portable 1 View  03/02/2016  CLINICAL DATA:  Shortness of breath with chest pain. History of COPD and CHF. EXAM: PORTABLE CHEST 1 VIEW COMPARISON:  08/09/2006 FINDINGS: Dual lead pacemaker remains in place. The cardiac silhouette appears mildly enlarged. There is mild pulmonary vascular congestion with new perihilar and bibasilar lung opacities. No sizable pleural effusion or pneumothorax is identified. No acute osseous abnormality is identified. IMPRESSION: Cardiomegaly with mild perihilar and bibasilar opacities suspicious for edema. Electronically Signed   By: Logan Bores M.D.   On: 03/02/2016 16:10   Medications: I have reviewed the patient's current medications. Scheduled Meds: . allopurinol  300 mg  Oral Daily  . amiodarone  200 mg Oral Daily  . anastrozole  1 mg Oral Daily  . diltiazem  240 mg Oral Daily  . furosemide  40 mg Intravenous Once  . furosemide  40 mg Intravenous BID  . levothyroxine  25 mcg Oral QAC breakfast  . metolazone  2.5 mg Oral Daily  . metoprolol succinate  100 mg Oral Daily  . pantoprazole  40 mg Oral Daily  . pravastatin  20 mg Oral QHS  . warfarin  2.5 mg Oral ONCE-1800  . Warfarin - Pharmacist Dosing Inpatient   Does not apply q1800   Continuous Infusions:  PRN Meds:.alum & mag hydroxide-simeth, HYDROcodone-acetaminophen Assessment/Plan: Principal Problem:   CHF exacerbation ([place]) Active Problems:   DM  (diabetes mellitus) type II controlled with renal manifestation (H[facility])   Hyperlipemia   Obstructive sleep apnea   Essential hypertension   PAROXYSMAL ATRIAL FIBRILLATION   COPD (chronic obstructive pulmonary disease) ([facility])   Breast cancer of upper-outer quadrant of right female breast ([facility])   Shortness of breath   Pain in the chest  HFpEF Exacerbation: Patient with 4 day history of decreasing urinary output, weight gain and dyspnea with exertion. Pro-BNP was elevated at 1600 at PCP's office and BNP 560 in the ED. CXR demonstrated perihilar and bibasilar opacities suspicious for edema. Only trace pedal edema on exam but patient does have JVD present as well as bibasilar crackles. No ECHO on file but patient does have a history of non-ischemic diastolic congestive heart failure listed. No leg edema, no extended travel or recent surgeries. Does have a history of breast cancer that was treated a year ago. Well Score 1.5. She is on warfarin with INR 2.6 today. Low suspicion for DVT/PE. EKG showing atrial fibrillation. Her HFpEF is likely in the setting of atrial fibrillation due to lost atrial kick. Diuresed -0.5 L since admission.  -Consult cardiology  -Lasix 40 mg IV bid -Metolazone 2.5 mg daily -ECHO pending  -Strict I&O, daily weights  Chest Pain: Patient reports episode of substernal chest pressure Wednesday night and again today. The pain was non-radiating, worsened with exertion and improved with rest and nitro. Initial troponin negative. EKG with ventricular pacing, no ST elevations, T-wave inversions or Q-waves. Patient did have tenderness to palpation over the sternum on exam. Denied any trauma or falls.  Troponin x3 negative. Patient today told us her pain is epigastric in location and that she was told in the past it was due to esophageal spasms.  -Telemetry  Paroxsymal Atrial Fibrillation: HR 60s and regular on admission. Patient is currently on amiodarone, diltiazem, metoprolol and  warfarin for PAF. INR 2.6 on admission. CHAD-VASc score 6. Reports compliance with her medications. -Continue amiodarone 200 mg daily -Continue Diltiazem 240 mg daily -Continue Metorpolol 100 mg daily -Warfarin per pharmacy  HTN: Patient is on amiodarone, diltiazem, metoprolol and doxazosin for HTN at home. BP has been soft in the ED in the 58I systolic. -Holding doxazosin -Continue amio, dilt and metoprolol  DM II: Diet controlled. Last A1c 6.1 on 12/29/15.  -CBGs qac and qhs  AKI on CKD III: Cr 1.20 on admission. Baseline 1-1.26 with GFR 40-50. Cr improved to 1.1 now.  -Continue to monitor   Hx of Breast Cancer: Right breast low-grade DCIS 8 mm in size status post lumpectomy with focal inferior margin positive requiring reexcision 02/15/2015 ER 100% PR 100% positive Anastrozole 1 mg daily for 5 years started 02/17/2015 -Continue Anastrozole  HLD:  -Pravastatin 20 mg  daily  Hypothyroidism: TSH 3 at last check 09/2014.  -TSH -Continue Synthroid 25 mcg daily  OSA: Patient reports she cannot tolerate CPAP and does not use it at home. Declined to have it while inpatient.   DVT PPx: Warfarin  Code Status: Full  Dispo: Disposition is deferred at this time, awaiting improvement of current medical problems.  Anticipated discharge in approximately 2-3 day(s).   The patient does have a current PCP Raelene Bott, MD) and does need an [facility] hospital follow-up appointment after discharge.  The patient does not have transportation limitations that hinder transportation to clinic appointments.  .Services Needed at time of discharge: Y = Yes, Blank = No PT:   OT:   RN:   Equipment:   Other:     LOS: 1 day   Shela Leff, MD 03/03/2016, 12:24 PM

## 2016-03-03 NOTE — Plan of Care (Signed)
Problem: Pain Managment: Goal: General experience of comfort will improve Outcome: Progressing Pt denies any pain today. Pt ambulated to bathroom with one assist twice. Pt was offered to ambulate in the halls but once at the door pt felt dizzy, returned to chair. Will recheck for dizziness.

## 2016-03-03 NOTE — Progress Notes (Signed)
Report received via [place] RN via phone from ED, updated on patient's reason for coming to the ED and what happened prior to her being admitted, awaiting her to come to 3W-28.

## 2016-03-03 NOTE — Progress Notes (Signed)
Patient ID: Traci [name], female   DOB: 1932/06/30, 82 y.o.   MRN: [id number] Discussed pacer interrogation with [employer] Battery is at EOL Pacer has reverted to VVI pacing with loss of AV synchrony Underlying rhythm is SB in 40's   Will hold coumadin Plan PPM replacement Monday with Dr Lovena Le Continue diuresis  Jenkins Rouge

## 2016-03-04 ENCOUNTER — Inpatient Hospital Stay (HOSPITAL_COMMUNITY): Payer: Medicare Other

## 2016-03-04 LAB — CBC
HEMATOCRIT: 35.7 % — AB (ref 36.0–46.0)
HEMOGLOBIN: 11.7 g/dL — AB (ref 12.0–15.0)
MCH: 29.7 pg (ref 26.0–34.0)
MCHC: 32.8 g/dL (ref 30.0–36.0)
MCV: 90.6 fL (ref 78.0–100.0)
Platelets: 255 10*3/uL (ref 150–400)
RBC: 3.94 MIL/uL (ref 3.87–5.11)
RDW: 14.9 % (ref 11.5–15.5)
WBC: 10.1 10*3/uL (ref 4.0–10.5)

## 2016-03-04 LAB — BASIC METABOLIC PANEL
ANION GAP: 11 (ref 5–15)
BUN: 28 mg/dL — AB (ref 6–20)
CHLORIDE: 99 mmol/L — AB (ref 101–111)
CO2: 31 mmol/L (ref 22–32)
Calcium: 9.1 mg/dL (ref 8.9–10.3)
Creatinine, Ser: 1.24 mg/dL — ABNORMAL HIGH (ref 0.44–1.00)
GFR calc Af Amer: 46 mL/min — ABNORMAL LOW (ref >60)
GFR calc non Af Amer: 39 mL/min — ABNORMAL LOW (ref >60)
GLUCOSE: 112 mg/dL — AB (ref 65–99)
POTASSIUM: 3.5 mmol/L (ref 3.5–5.1)
Sodium: 141 mmol/L (ref 135–145)

## 2016-03-04 LAB — PROTIME-INR
INR: 2.47 — ABNORMAL HIGH (ref 0.00–1.49)
PROTHROMBIN TIME: 26.5 s — AB (ref 11.6–15.2)

## 2016-03-04 MED ORDER — SODIUM CHLORIDE 0.9 % IR SOLN
80.0000 mg | Status: DC
  Filled 2016-03-04: qty 2

## 2016-03-04 MED ORDER — BISACODYL 10 MG RE SUPP
10.0000 mg | Freq: Once | RECTAL | Status: AC
Start: 2016-03-04 — End: 2016-03-04
  Administered 2016-03-04: 10 mg via RECTAL
  Filled 2016-03-04: qty 1

## 2016-03-04 MED ORDER — CEFAZOLIN SODIUM-DEXTROSE 2-4 GM/100ML-% IV SOLN
2.0000 g | INTRAVENOUS | Status: DC
  Filled 2016-03-04: qty 100

## 2016-03-04 MED ORDER — VANCOMYCIN HCL 10 G IV SOLR
1500.0000 mg | INTRAVENOUS | Status: AC
  Administered 2016-03-05: 1500 mg via INTRAVENOUS
  Filled 2016-03-04: qty 1500

## 2016-03-04 MED ORDER — SODIUM CHLORIDE 0.9 % IV SOLN: INTRAVENOUS | Status: DC

## 2016-03-04 MED ORDER — VITAMIN K1 10 MG/ML IJ SOLN
10.0000 mg | Freq: Once | INTRAMUSCULAR | Status: AC
  Administered 2016-03-04: 10 mg via SUBCUTANEOUS
  Filled 2016-03-04: qty 1

## 2016-03-04 MED ORDER — SODIUM CHLORIDE 0.9 % IV SOLN
INTRAVENOUS | Status: DC
  Administered 2016-03-05: 16:00:00 via INTRAVENOUS

## 2016-03-04 MED ORDER — FUROSEMIDE 10 MG/ML IJ SOLN
40.0000 mg | Freq: Two times a day (BID) | INTRAMUSCULAR | Status: AC
  Administered 2016-03-04: 40 mg via INTRAVENOUS
  Filled 2016-03-04: qty 4

## 2016-03-04 MED ORDER — SODIUM CHLORIDE 0.9 % IR SOLN
80.0000 mg | Status: AC
  Administered 2016-03-05: 80 mg
  Filled 2016-03-04: qty 2

## 2016-03-04 MED ORDER — POLYETHYLENE GLYCOL 3350 17 G PO PACK
17.0000 g | PACK | Freq: Every day | ORAL | Status: DC
  Administered 2016-03-04: 17 g via ORAL
  Filled 2016-03-04: qty 1

## 2016-03-04 NOTE — Progress Notes (Signed)
Subjective: No acute events overnight. Her dyspnea has improved. Denies chest pain.  Objective: Vital signs in last 24 hours: Filed Vitals:   03/03/16 1400 03/03/16 1950 03/04/16 0500 03/04/16 0729  BP: 112/59 109/53 99/45 109/55  Pulse: 65 67 65 66  Temp: 97.6 F (36.4 C) 97.9 F (36.6 C) 98.1 F (36.7 C)   TempSrc: Oral Oral Oral   Resp: '20 18 20   '$ Height:      Weight:   236 lb 14.4 oz (107.457 kg)   SpO2: 94% 95% 94%    Weight change: -1.6 oz (-0.045 kg)  Intake/Output Summary (Last 24 hours) at 03/04/16 4782 Last data filed at 03/04/16 9562  Gross per 24 hour  Intake   1200 ml  Output   2250 ml  Net  -1050 ml   General Apperance: NAD HEENT: Normocephalic, atraumatic, anicteric sclera Neck: Supple, trachea midline Lungs: Clear to auscultation bilaterally. No wheezes, rhonchi or rales. Breathing comfortably Heart: Regular rate and rhythm Abdomen: Soft, nontender, nondistended, no rebound/guarding Extremities: Warm and well perfused, no edema Skin: No rashes or lesions Neurologic: Alert and interactive. No gross deficits.  Lab Results: Basic Metabolic Panel:  Recent Labs Lab 03/02/16 2131 03/03/16 0627 03/04/16 0518  NA 140 142 141  K 3.5 3.3* 3.5  CL 99* 101 99*  CO2 '28 28 31  '$ GLUCOSE 141* 116* 112*  BUN 27* 26* 28*  CREATININE 1.26* 1.16* 1.24*  CALCIUM 8.8* 9.0 9.1  MG 1.8  --   --    Liver Function Tests:  Recent Labs Lab 03/02/16 2131  AST 38  ALT 27  ALKPHOS 61  BILITOT 0.8  PROT 6.1*  ALBUMIN 3.4*    Recent Labs Lab 03/02/16 2131  LIPASE 37   CBC:  Recent Labs Lab 03/02/16 1600 03/02/16 1610 03/04/16 0518  WBC 10.8*  --  10.1  NEUTROABS 8.3*  --   --   HGB 12.1 14.3 11.7*  HCT 37.8 42.0 35.7*  MCV 91.1  --  90.6  PLT 253  --  255   Cardiac Enzymes:  Recent Labs Lab 03/02/16 2131 03/03/16 0627 03/03/16 0829  TROPONINI <0.03 <0.03 <0.03   CBG:  Recent Labs Lab 03/02/16 2127 03/03/16 2148  GLUCAP 141* 111*    Thyroid Function Tests:  Recent Labs Lab 03/02/16 2131  TSH 4.110   Coagulation:  Recent Labs Lab 03/02/16 1600 03/03/16 0627 03/04/16 0518  LABPROT 27.6* 27.5* 26.5*  INR 2.62* 2.60* 2.47*   Studies/Results: Dg Chest Portable 1 View  03/02/2016  CLINICAL DATA:  Shortness of breath with chest pain. History of COPD and CHF. EXAM: PORTABLE CHEST 1 VIEW COMPARISON:  08/09/2006 FINDINGS: Dual lead pacemaker remains in place. The cardiac silhouette appears mildly enlarged. There is mild pulmonary vascular congestion with new perihilar and bibasilar lung opacities. No sizable pleural effusion or pneumothorax is identified. No acute osseous abnormality is identified. IMPRESSION: Cardiomegaly with mild perihilar and bibasilar opacities suspicious for edema. Electronically Signed   By: Logan Bores M.D.   On: 03/02/2016 16:10   Medications: I have reviewed the patient's current medications. Scheduled Meds: . allopurinol  300 mg Oral Daily  . amiodarone  200 mg Oral Daily  . anastrozole  1 mg Oral Daily  . diltiazem  240 mg Oral Daily  . furosemide  40 mg Intravenous Once  . furosemide  40 mg Intravenous BID  . gentamicin irrigation  80 mg Irrigation On Call  . levothyroxine  25 mcg Oral  QAC breakfast  . metolazone  2.5 mg Oral Daily  . metoprolol succinate  100 mg Oral Daily  . pantoprazole  40 mg Oral Daily  . phytonadione  10 mg Subcutaneous Once  . pravastatin  20 mg Oral QHS  . vancomycin  1,500 mg Intravenous On Call   Continuous Infusions: . sodium chloride     PRN Meds:.alum & mag hydroxide-simeth, HYDROcodone-acetaminophen, zolpidem Assessment/Plan: 82 year old woman with diastolic CHF presented with 4 day hx of increasing weight, dyspnea with exertion with chest pressure on exertion.  HFpEF Exacerbation: Echo with LV EF 60-65%, no regional wall motion abnormalities. PA peak pressure 34. -Cardiology following, appreciate recommendations -Lasix 40 mg IV  bid -Metolazone 2.5 mg daily -Strict I&O, daily weights  Symptomatic bradycardia s/p pacemaker insertion:  -Plan for generator replacement Monday or Tuesday  Chest Pain: Troponin neg x 3 -Will need Myoview  Paroxsymal Atrial Fibrillation: CHAD-VASc score 6. Reports compliance with her medications. -Continue amiodarone 200 mg daily -Continue Diltiazem 240 mg daily -Continue Metorpolol 100 mg daily -Warfarin held. Given 1 dose of vit K 3/26. Hep gtt when INR <2 per pharmacy.  HTN:  -Holding doxazosin -Continue dilt and metoprolol  DM II: Diet controlled. Last A1c 6.1 on 12/29/15.   CKD III: Cr 1.20 on admission. Baseline 1-1.26 with GFR 40-50.  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 Anastrozole 1 mg daily for 5 years started 02/17/2015 -Continue Anastrozole  HLD: Pravastatin 20 mg daily  Hypothyroidism: TSH 4.11 -Continue Synthroid 25 mcg daily  OSA: Patient reports she cannot tolerate CPAP and does not use it at home. Declined to have it while inpatient.   DVT PPx: Warfarin  Code Status: Full  Dispo: Disposition is deferred at this time, awaiting improvement of current medical problems.  Anticipated discharge in approximately 2-3 day(s).    LOS: 2 days   Milagros Loll, MD 03/04/2016, 9:24 AM

## 2016-03-04 NOTE — Plan of Care (Signed)
Problem: Safety: Goal: Ability to remain free from injury will improve Outcome: Progressing Patient uses call light most of the time but needs reinforcement because she doesn't want to be a bother, explained that we want to keep her safe and she verbalized understanding, will continue to monitor. Patient has her bedside tray with most of her things eithin her reach

## 2016-03-04 NOTE — Progress Notes (Signed)
Patient ID: Traci [name], female   DOB: 1932-04-05, 82 y.o.   MRN: [id number]    Subjective:  OOB less dyspnea  Objective:  Filed Vitals:   03/03/16 1400 03/03/16 1950 03/04/16 0500 03/04/16 0729  BP: 112/59 109/53 99/45 109/55  Pulse: 65 67 65   Temp: 97.6 F (36.4 C) 97.9 F (36.6 C) 98.1 F (36.7 C)   TempSrc: Oral Oral Oral   Resp: '20 18 20   '$ Height:      Weight:   107.457 kg (236 lb 14.4 oz)   SpO2: 94% 95% 94%     Intake/Output from previous day:  Intake/Output Summary (Last 24 hours) at 03/04/16 1779 Last data filed at 03/04/16 3903  Gross per 24 hour  Intake   1440 ml  Output   2900 ml  Net  -1460 ml    Physical Exam: Affect appropriate Healthy:  appears stated age HEENT: normal Neck supple with no adenopathy JVP normal no bruits no thyromegaly Lungs clear with no wheezing and good diaphragmatic motion Heart:  S1/S2 no murmur, no rub, gallop or click pacer under left clavicle  PMI normal Abdomen: benighn, BS positve, no tenderness, no AAA no bruit.  No HSM or HJR Distal pulses intact with no bruits No edema Neuro non-focal Skin warm and dry No muscular weakness  Lab Results: Basic Metabolic Panel:  Recent Labs  03/02/16 2131 03/03/16 0627 03/04/16 0518  NA 140 142 141  K 3.5 3.3* 3.5  CL 99* 101 99*  CO2 '28 28 31  '$ GLUCOSE 141* 116* 112*  BUN 27* 26* 28*  CREATININE 1.26* 1.16* 1.24*  CALCIUM 8.8* 9.0 9.1  MG 1.8  --   --    Liver Function Tests:  Recent Labs  03/02/16 2131  AST 38  ALT 27  ALKPHOS 61  BILITOT 0.8  PROT 6.1*  ALBUMIN 3.4*    Recent Labs  03/02/16 2131  LIPASE 37   CBC:  Recent Labs  03/02/16 1600 03/02/16 1610 03/04/16 0518  WBC 10.8*  --  10.1  NEUTROABS 8.3*  --   --   HGB 12.1 14.3 11.7*  HCT 37.8 42.0 35.7*  MCV 91.1  --  90.6  PLT 253  --  255   Cardiac Enzymes:  Recent Labs  03/02/16 2131 03/03/16 0627 03/03/16 0829  TROPONINI <0.03 <0.03 <0.03   Thyroid Function Tests:  Recent  Labs  03/02/16 2131  TSH 4.110    Imaging: Dg Chest Portable 1 View  03/02/2016  CLINICAL DATA:  Shortness of breath with chest pain. History of COPD and CHF. EXAM: PORTABLE CHEST 1 VIEW COMPARISON:  08/09/2006 FINDINGS: Dual lead pacemaker remains in place. The cardiac silhouette appears mildly enlarged. There is mild pulmonary vascular congestion with new perihilar and bibasilar lung opacities. No sizable pleural effusion or pneumothorax is identified. No acute osseous abnormality is identified. IMPRESSION: Cardiomegaly with mild perihilar and bibasilar opacities suspicious for edema. Electronically Signed   By: Logan Bores M.D.   On: 03/02/2016 16:10    Cardiac Studies:  ECG: VVI pacing 65 occasional sinus beats    Telemetry: VVI pacing with sinus escape beats  03/04/2016   Echo: EF 60-65%  No RWMA  Medications:   . allopurinol  300 mg Oral Daily  . amiodarone  200 mg Oral Daily  . anastrozole  1 mg Oral Daily  . diltiazem  240 mg Oral Daily  . furosemide  40 mg Intravenous Once  . furosemide  40  mg Intravenous BID  . levothyroxine  25 mcg Oral QAC breakfast  . metolazone  2.5 mg Oral Daily  . metoprolol succinate  100 mg Oral Daily  . pantoprazole  40 mg Oral Daily  . pravastatin  20 mg Oral QHS       Assessment/Plan:  Pacer:  EOL  Revert to VVI pacing plan to replace generator while in hospital may need to wait until Tuesday given INR PAF:  In sinus on interogation start heparin when INR under 2.0  Will give one dose Vit K to see if pacer can be done tomorrow continue amiodarone Anticoagulation:  Coumadin on hold Vit K today check INR in am CHF:  Mild cleared on iv lasix related to loss of AV synchrony  Chest Pain:  After pacer issues resolved would benefit from nuclear study r/o and echo with no RWMA;s normal EF   Jenkins Rouge 03/04/2016, 8:32 AM

## 2016-03-04 NOTE — Progress Notes (Signed)
Utilization review completed.  

## 2016-03-04 NOTE — Plan of Care (Signed)
Problem: Safety: Goal: Ability to remain free from injury will improve Outcome: Progressing Pt refused both bed and chair alarm. Ambulating to bathroom independently with steady gait. Informed RN and I she will  Call for assistance when needed. Will cont. To monitor pt.

## 2016-03-04 NOTE — Progress Notes (Signed)
ANTICOAGULATION CONSULT NOTE - Follow Up Consult  Pharmacy Consult for Heparin Indication: atrial fibrillation  Allergies  Allergen Reactions  . Penicillins Swelling and Other (See Comments)    "Felt like brain was swelling out of her head"  . Codeine Other (See Comments)    Stomach issues  . Shellfish Allergy Hives and Itching  . Sulfonamide Derivatives Other (See Comments)    Immune to this    Patient Measurements: Height: '5\' 6"'$  (167.6 cm) Weight: 236 lb 14.4 oz (107.457 kg) IBW/kg (Calculated) : 59.3 Heparin Dosing Weight: 84 kg  Vital Signs: Temp: 98.1 F (36.7 C) (03/26 0500) Temp Source: Oral (03/26 0500) BP: 109/55 mmHg (03/26 0729) Pulse Rate: 66 (03/26 0729)  Labs:  Recent Labs  03/02/16 1600  03/02/16 1610 03/02/16 2131 03/03/16 0627 03/03/16 0829 03/04/16 0518  HGB 12.1  --  14.3  --   --   --  11.7*  HCT 37.8  --  42.0  --   --   --  35.7*  PLT 253  --   --   --   --   --  255  LABPROT 27.6*  --   --   --  27.5*  --  26.5*  INR 2.62*  --   --   --  2.60*  --  2.47*  CREATININE  --   < > 1.20* 1.26* 1.16*  --  1.24*  TROPONINI  --   --   --  <0.03 <0.03 <0.03  --   < > = values in this interval not displayed.  Estimated Creatinine Clearance: 43.4 mL/min (by C-G formula based on Cr of 1.24).   Assessment: 82 year old female who now needs a pacemaker replacement. She is on chronic anticoagulation with Coumadin for atrial fibrillation and her INR is therapeutic. Coumadin has been discontinued and a request received for Heparin when INR is <2. MD plans to give Vit K today Currently her INR is 2.47  Goal of Therapy:  Heparin level 0.3-0.7 units/ml Monitor platelets by anticoagulation protocol: Yes   Plan:  Check INR daily with AM labs Start Heparin infusion once INR is <2  Darl Pikes, PharmD Clinical Pharmacist- Resident Pager: [phone]   03/04/2016, 9:15 AM

## 2016-03-04 NOTE — Progress Notes (Signed)
Was able to text paged teaching MD fellow and get an order for Ambien 5 mg for patient at her request to help her sleep, will try later tonight and continue to monitor.

## 2016-03-05 ENCOUNTER — Encounter (HOSPITAL_COMMUNITY)
Admission: EM | Disposition: A | Discharge status: Home / Self Care | Payer: Self-pay | Source: Home / Self Care | Attending: Student in an Organized Health Care Education/Training Program

## 2016-03-05 ENCOUNTER — Encounter (HOSPITAL_COMMUNITY): Payer: Self-pay | Admitting: Oncology

## 2016-03-05 DIAGNOSIS — Z950 Presence of cardiac pacemaker: Secondary | ICD-10-CM

## 2016-03-05 DIAGNOSIS — R001 Bradycardia, unspecified: Secondary | ICD-10-CM

## 2016-03-05 DIAGNOSIS — E785 Hyperlipidemia, unspecified: Secondary | ICD-10-CM

## 2016-03-05 DIAGNOSIS — I495 Sick sinus syndrome: Secondary | ICD-10-CM

## 2016-03-05 DIAGNOSIS — Z45010 Encounter for checking and testing of cardiac pacemaker pulse generator [battery]: Secondary | ICD-10-CM

## 2016-03-05 DIAGNOSIS — G4733 Obstructive sleep apnea (adult) (pediatric): Secondary | ICD-10-CM

## 2016-03-05 DIAGNOSIS — Z853 Personal history of malignant neoplasm of breast: Secondary | ICD-10-CM

## 2016-03-05 DIAGNOSIS — N183 Chronic kidney disease, stage 3 (moderate): Secondary | ICD-10-CM

## 2016-03-05 DIAGNOSIS — E1122 Type 2 diabetes mellitus with diabetic chronic kidney disease: Secondary | ICD-10-CM

## 2016-03-05 DIAGNOSIS — R079 Chest pain, unspecified: Secondary | ICD-10-CM

## 2016-03-05 DIAGNOSIS — E039 Hypothyroidism, unspecified: Secondary | ICD-10-CM

## 2016-03-05 DIAGNOSIS — I130 Hypertensive heart and chronic kidney disease with heart failure and stage 1 through stage 4 chronic kidney disease, or unspecified chronic kidney disease: Secondary | ICD-10-CM

## 2016-03-05 DIAGNOSIS — Z7901 Long term (current) use of anticoagulants: Secondary | ICD-10-CM

## 2016-03-05 DIAGNOSIS — E876 Hypokalemia: Secondary | ICD-10-CM

## 2016-03-05 HISTORY — DX: Hypokalemia: E87.6

## 2016-03-05 HISTORY — PX: EP IMPLANTABLE DEVICE: SHX172B

## 2016-03-05 HISTORY — DX: Sick sinus syndrome: I49.5

## 2016-03-05 LAB — CBC
HEMATOCRIT: 37 % (ref 36.0–46.0)
HEMOGLOBIN: 11.9 g/dL — AB (ref 12.0–15.0)
MCH: 28.6 pg (ref 26.0–34.0)
MCHC: 32.2 g/dL (ref 30.0–36.0)
MCV: 88.9 fL (ref 78.0–100.0)
Platelets: 262 10*3/uL (ref 150–400)
RBC: 4.16 MIL/uL (ref 3.87–5.11)
RDW: 14.8 % (ref 11.5–15.5)
WBC: 10.4 10*3/uL (ref 4.0–10.5)

## 2016-03-05 LAB — BASIC METABOLIC PANEL
ANION GAP: 14 (ref 5–15)
ANION GAP: 9 (ref 5–15)
Anion gap: 14 (ref 5–15)
BUN: 33 mg/dL — AB (ref 6–20)
BUN: 30 mg/dL — AB (ref 6–20)
BUN: 30 mg/dL — ABNORMAL HIGH (ref 6–20)
CALCIUM: 8.8 mg/dL — AB (ref 8.9–10.3)
CHLORIDE: 98 mmol/L — AB (ref 101–111)
CHLORIDE: 100 mmol/L — AB (ref 101–111)
CO2: 28 mmol/L (ref 22–32)
CO2: 28 mmol/L (ref 22–32)
CO2: 29 mmol/L (ref 22–32)
CREATININE: 1.18 mg/dL — AB (ref 0.44–1.00)
CREATININE: 1.12 mg/dL — AB (ref 0.44–1.00)
Calcium: 8.7 mg/dL — ABNORMAL LOW (ref 8.9–10.3)
Calcium: 9 mg/dL (ref 8.9–10.3)
Chloride: 95 mmol/L — ABNORMAL LOW (ref 101–111)
Creatinine, Ser: 1.3 mg/dL — ABNORMAL HIGH (ref 0.44–1.00)
GFR calc Af Amer: 43 mL/min — ABNORMAL LOW (ref >60)
GFR calc Af Amer: 48 mL/min — ABNORMAL LOW (ref >60)
GFR calc Af Amer: 52 mL/min — ABNORMAL LOW (ref >60)
GFR calc non Af Amer: 42 mL/min — ABNORMAL LOW (ref >60)
GFR calc non Af Amer: 44 mL/min — ABNORMAL LOW (ref >60)
GFR, EST NON AFRICAN AMERICAN: 37 mL/min — AB (ref >60)
GLUCOSE: 97 mg/dL (ref 65–99)
GLUCOSE: 117 mg/dL — AB (ref 65–99)
GLUCOSE: 108 mg/dL — AB (ref 65–99)
POTASSIUM: 2.2 mmol/L — AB (ref 3.5–5.1)
POTASSIUM: 2.9 mmol/L — AB (ref 3.5–5.1)
Potassium: 3 mmol/L — ABNORMAL LOW (ref 3.5–5.1)
SODIUM: 140 mmol/L (ref 135–145)
Sodium: 137 mmol/L (ref 135–145)
Sodium: 138 mmol/L (ref 135–145)

## 2016-03-05 LAB — MAGNESIUM: Magnesium: 1.9 mg/dL (ref 1.7–2.4)

## 2016-03-05 LAB — PROTIME-INR
INR: 1.97 — AB (ref 0.00–1.49)
PROTHROMBIN TIME: 22.3 s — AB (ref 11.6–15.2)

## 2016-03-05 SURGERY — PPM/BIV PPM GENERATOR CHANGEOUT

## 2016-03-05 MED ORDER — POTASSIUM CHLORIDE 10 MEQ/100ML IV SOLN
10.0000 meq | INTRAVENOUS | Status: AC
  Administered 2016-03-05 (×2): 10 meq via INTRAVENOUS
  Filled 2016-03-05 (×3): qty 100

## 2016-03-05 MED ORDER — SODIUM CHLORIDE 0.9 % IR SOLN
Status: AC
  Filled 2016-03-05: qty 2

## 2016-03-05 MED ORDER — VANCOMYCIN HCL 10 G IV SOLR
1500.0000 mg | INTRAVENOUS | Status: DC
  Filled 2016-03-05: qty 1500

## 2016-03-05 MED ORDER — WARFARIN SODIUM 2.5 MG PO TABS
2.5000 mg | ORAL_TABLET | Freq: Once | ORAL | Status: AC
  Administered 2016-03-05: 2.5 mg via ORAL

## 2016-03-05 MED ORDER — FENTANYL CITRATE (PF) 100 MCG/2ML IJ SOLN
INTRAMUSCULAR | Status: AC
Start: 2016-03-05 — End: 2016-03-05
  Filled 2016-03-05: qty 2

## 2016-03-05 MED ORDER — WARFARIN SODIUM 2.5 MG PO TABS
2.5000 mg | ORAL_TABLET | Freq: Every day | ORAL | Status: DC
  Filled 2016-03-05: qty 1

## 2016-03-05 MED ORDER — POTASSIUM CHLORIDE CRYS ER 20 MEQ PO TBCR
80.0000 meq | EXTENDED_RELEASE_TABLET | Freq: Once | ORAL | Status: AC
  Administered 2016-03-05: 80 meq via ORAL
  Filled 2016-03-05: qty 4

## 2016-03-05 MED ORDER — LIDOCAINE HCL (PF) 1 % IJ SOLN
INTRAMUSCULAR | Status: DC | PRN
  Administered 2016-03-05: 31 mL

## 2016-03-05 MED ORDER — WARFARIN - PHARMACIST DOSING INPATIENT: Freq: Every day | Status: DC

## 2016-03-05 MED ORDER — POTASSIUM CHLORIDE CRYS ER 20 MEQ PO TBCR
40.0000 meq | EXTENDED_RELEASE_TABLET | Freq: Once | ORAL | Status: AC
  Administered 2016-03-06: 40 meq via ORAL
  Filled 2016-03-05: qty 2

## 2016-03-05 MED ORDER — LIDOCAINE HCL (PF) 1 % IJ SOLN
INTRAMUSCULAR | Status: AC
  Filled 2016-03-05: qty 60

## 2016-03-05 MED ORDER — METOLAZONE 2.5 MG PO TABS: 2.5000 mg | ORAL_TABLET | Freq: Every day | ORAL | Status: DC

## 2016-03-05 MED ORDER — FENTANYL CITRATE (PF) 100 MCG/2ML IJ SOLN
INTRAMUSCULAR | Status: DC | PRN
  Administered 2016-03-05: 12.5 ug via INTRAVENOUS

## 2016-03-05 MED ORDER — MIDAZOLAM HCL 5 MG/5ML IJ SOLN
INTRAMUSCULAR | Status: DC | PRN
  Administered 2016-03-05: 1 mg via INTRAVENOUS

## 2016-03-05 MED ORDER — MIDAZOLAM HCL 5 MG/5ML IJ SOLN
INTRAMUSCULAR | Status: AC
Start: 2016-03-05 — End: 2016-03-05
  Filled 2016-03-05: qty 5

## 2016-03-05 MED ORDER — HEPARIN (PORCINE) IN NACL 100-0.45 UNIT/ML-% IJ SOLN
1200.0000 [IU]/h | INTRAMUSCULAR | Status: DC
  Administered 2016-03-05: 1200 [IU]/h via INTRAVENOUS
  Filled 2016-03-05: qty 250

## 2016-03-05 MED ORDER — WARFARIN - PHYSICIAN DOSING INPATIENT: Freq: Every day | Status: DC

## 2016-03-05 SURGICAL SUPPLY — 5 items
CABLE SURGICAL S-101-97-12 (CABLE) ×2 IMPLANT
PACEMAKER ADAPTA DR ADDRL1 (Pacemaker) IMPLANT
PAD DEFIB LIFELINK (PAD) ×2 IMPLANT
PPM ADAPTA DR ADDRL1 (Pacemaker) ×3 IMPLANT
TRAY PACEMAKER INSERTION (PACKS) ×2 IMPLANT

## 2016-03-05 NOTE — Consult Note (Signed)
ELECTROPHYSIOLOGY CONSULT NOTE    Patient ID: Traci [name] MRN: [id number], DOB/AGE: Jan 26, 1932 82 y.o.  Admit date: 03/02/2016 Date of Consult: 03/05/2016  Primary Physician: Raelene Bott, MD Primary Cardiologist/Electrophysiologist: Dr. Lovena Le  Reason for Consultation:  Pacemaker generator at [facility]  HPI: Traci [name] is a 82 y.o. female with PMHx of PAFib (on warfarin), bradycardia with PPM, NICM, came to [facility] with about 4 days of 1 pound per day weight gain and increasing DOE and generalized weakness.  She had an element of C but this has remained resolved.  Her SOB is improved but still feels weak this morning.  She denies any near syncope or syncope.  No palpitations.  She was noted to have CHF exacerbation and her PPM also  her PPM to have reached [facility].  EP is being called to discuss/plan generator change.   Past Medical History  Diagnosis Date  . DOE (dyspnea on exertion)   . COPD (chronic obstructive pulmonary disease) ([place])   . Anxiety   . CHF (congestive heart failure) ([place])   . Diverticulitis of colon   . Paroxysmal atrial fibrillation ([facility])   . HTN (hypertension)   . Obesity   . Hyperlipidemia   . DM (diabetes mellitus) ([place])   . Full dentures   . Wears glasses   . OSA (obstructive sleep apnea)     has a cpap-cannot use  . GERD (gastroesophageal reflux disease)   . Arthritis   . History of DVT (deep vein thrombosis)   . Presence of permanent cardiac pacemaker   . Hypothyroidism   . Cancer [facility])     right breast cancer     Surgical History:  Past Surgical History  Procedure Laterality Date  . Abdominal hysterectomy    . C-spine surgery  1992    cervical disk  . Lower back surgery  1980  . Esophagogastroduodenoscopy    . Dilation and curettage of uterus    . Cardiac catheterization  2005  . Pacemaker insertion  2007  . Colonoscopy    . Breast lumpectomy with radioactive seed localization Right 02/08/2015    Procedure: BREAST LUMPECTOMY WITH RADIOACTIVE  SEED LOCALIZATION;  Surgeon: Stark Klein, MD;  Location: [place];  Service: General;  Laterality: Right;  . Re-excision of breast lumpectomy Right 02/14/2015    Procedure: RE-EXCISION OF RIGHT BREAST LUMPECTOMY;  Surgeon: Stark Klein, MD;  Location: [place];  Service: General;  Laterality: Right;  . Cholecystectomy    . Back surgery       Prescriptions prior to admission  Medication Sig Dispense Refill Last Dose  . allopurinol (ZYLOPRIM) 300 MG tablet Take 300 mg by mouth daily.   03/02/2016 at Unknown time  . alum hydroxide-mag trisilicate (GAVISCON) 70-62 MG CHEW chewable tablet Chew 1 tablet by mouth 4 (four) times daily as needed for indigestion or heartburn.   Past Week at Unknown time  . amiodarone (PACERONE) 200 MG tablet Take 1 tablet (200 mg total) by mouth daily. 30 tablet 4 03/02/2016 at Unknown time  . anastrozole (ARIMIDEX) 1 MG tablet Take 1 tablet (1 mg total) by mouth daily. 90 tablet 3 03/02/2016 at Unknown time  . bismuth subsalicylate (PEPTO BISMOL) 262 MG/15ML suspension Take 30 mLs by mouth every 6 (six) hours as needed.   Past Month at Unknown time  . diltiazem (CARDIZEM CD) 180 MG 24 hr capsule TAKE ONE CAPSULE BY MOUTH ONCE DAILY   03/02/2016 at Unknown time  . diphenhydramine-acetaminophen (  TYLENOL PM) 25-500 MG TABS tablet Take 1 tablet by mouth at bedtime as needed.   03/01/2016 at Unknown time  . doxazosin (CARDURA) 2 MG tablet Take 2 mg by mouth every morning.    03/02/2016 at Unknown time  . furosemide (LASIX) 80 MG tablet Take 80 mg by mouth daily.   03/02/2016 at Unknown time  . HYDROcodone-acetaminophen (NORCO) 10-325 MG per tablet Take 1 tablet by mouth every 6 (six) hours as needed for severe pain. 20 tablet 0 03/01/2016 at Unknown time  . levothyroxine (SYNTHROID, LEVOTHROID) 25 MCG tablet Take 25 mcg by mouth daily before breakfast.   5 03/02/2016 at Unknown time  . metolazone (ZAROXOLYN) 2.5 MG tablet Take 2.5 mg by mouth daily.     03/02/2016 at Unknown time  . metoprolol (TOPROL-XL) 100 MG 24 hr tablet Take 100 mg by mouth daily.     03/02/2016 at 0900  . nitroGLYCERIN (NITROSTAT) 0.4 MG SL tablet Place 0.4 mg under the tongue every 5 (five) minutes as needed for chest pain (MAX 3 TABLETS).    Past Week at Unknown time  . omeprazole (PRILOSEC) 40 MG capsule Take 40 mg by mouth daily.   03/02/2016 at Unknown time  . potassium chloride (KLOR-CON) 8 MEQ tablet Take 48-56 mEq by mouth 3 (three) times daily. TAKES 6 TABS IN AM, 7 TABS AROUND LUNCH AND 7 TABS IN PM   03/02/2016 at Unknown time  . pravastatin (PRAVACHOL) 20 MG tablet Take 20 mg by mouth daily.   3 03/01/2016 at Unknown time  . warfarin (COUMADIN) 5 MG tablet Take 2.5-5 mg by mouth daily at 6 PM. TAKES '5MG'$  ON WED, THURS AND FRI ONLY TAKES 2.'5MG'$  ALL OTHER DAYS   03/01/2016 at Unknown time    Inpatient Medications:  . allopurinol  300 mg Oral Daily  . amiodarone  200 mg Oral Daily  . anastrozole  1 mg Oral Daily  . diltiazem  240 mg Oral Daily  . gentamicin irrigation  80 mg Irrigation On Call  . levothyroxine  25 mcg Oral QAC breakfast  . metolazone  2.5 mg Oral Daily  . metoprolol succinate  100 mg Oral Daily  . pantoprazole  40 mg Oral Daily  . polyethylene glycol  17 g Oral Daily  . potassium chloride  10 mEq Intravenous Q1 Hr x 4  . pravastatin  20 mg Oral QHS  . vancomycin  1,500 mg Intravenous On Call    Allergies:  Allergies  Allergen Reactions  . Penicillins Swelling and Other (See Comments)    "Felt like brain was swelling out of her head"  . Codeine Other (See Comments)    Stomach issues  . Shellfish Allergy Hives and Itching  . Sulfonamide Derivatives Other (See Comments)    Immune to this    Social History   Social History  . Marital Status: Married    Spouse Name: N/A  . Number of Children: N/A  . Years of Education: N/A   Occupational History  . Retired from [employer]  . Smoking status: Former  Smoker -- 1.00 packs/day for 50 years    Quit date: 12/10/1996  . Smokeless tobacco: Not on file     Comment: gerater than 50 pack year history of tobacco-quit in 1998  . Alcohol Use: No  . Drug Use: No  . Sexual Activity: Yes   Other Topics Concern  . Not on file   Social History Narrative  Family History  Problem Relation Age of Onset  . Heart failure Mother   . Pancreatic cancer Father 75  . Vaginal cancer Sister 40  . Hodgkin's lymphoma Sister 19  . Lung cancer Sister 34     Review of Systems: General: No chills, fever, night sweats or weight changes  Cardiovascular:  No chest pain, dyspnea on exertion, edema, orthopnea, palpitations, paroxysmal nocturnal dyspnea Dermatological: No rash, lesions or masses Respiratory: No cough, dyspnea Urologic: No hematuria, dysuria Abdominal: No nausea, vomiting, diarrhea, bright red blood per rectum, melena, or hematemesis Neurologic: No visual changes, weakness, changes in mental status All other systems reviewed and are otherwise negative except as noted above.  Physical Exam: Filed Vitals:   03/04/16 0729 03/04/16 1350 03/04/16 2000 03/05/16 0500  BP: 109/55 103/50 100/48 96/52  Pulse: 66 64 64 87  Temp:  98.5 F (36.9 C) 98.3 F (36.8 C) 98.4 F (36.9 C)  TempSrc:  Oral Oral Oral  Resp:  '20 20 20  '$ Height:      Weight:    236 lb 5.3 oz (107.2 kg)  SpO2:  96% 95% 98%    GEN- The patient is well appearing, alert and oriented x 3 today.   HEENT: normocephalic, atraumatic; sclera clear, conjunctiva pink; hearing intact; oropharynx clear; neck supple, no JVP Lymph- no cervical lymphadenopathy Lungs- Clear to ausculation bilaterally, normal work of breathing.  No wheezes, rales, rhonchi Heart- Regular rate and rhythm, no murmurs, rubs or gallops, PMI not laterally displaced GI- soft, non-tender, non-distended, bowel sounds present Extremities- no clubbing, cyanosis, or edema MS- no significant deformity or atrophy Skin-  warm and dry, no rash or lesion Psych- euthymic mood, full affect Neuro- no gross deficits observed  Labs:   Lab Results  Component Value Date   WBC 10.4 03/05/2016   HGB 11.9* 03/05/2016   HCT 37.0 03/05/2016   MCV 88.9 03/05/2016   PLT 262 03/05/2016    Recent Labs Lab 03/02/16 2131  03/05/16 0301  NA 140  < > 137  K 3.5  < > 2.2*  CL 99*  < > 95*  CO2 28  < > 28  BUN 27*  < > 33*  CREATININE 1.26*  < > 1.30*  CALCIUM 8.8*  < > 8.7*  PROT 6.1*  --   --   BILITOT 0.8  --   --   ALKPHOS 61  --   --   ALT 27  --   --   AST 38  --   --   GLUCOSE 141*  < > 97  < > = values in this interval not displayed.    Radiology/Studies:  Dg Chest 2 View 03/04/2016  CLINICAL DATA:  Pacemaker battery replacement. Chest pain. Ex-smoker. Shortness of breath and weakness. EXAM: CHEST  2 VIEW COMPARISON:  03/02/2016. FINDINGS: Cardiac size within normal limits. Dual lead pacer unremarkable. RIGHT pleural effusion. Mild vascular congestion without active infiltrates or failure. Mild scarring LEFT base. Degenerative change thoracic spine. IMPRESSION: Chronic changes as described, with a RIGHT pleural effusion which is increased from radiograph 2 days ago. Electronically Signed   By: Staci Righter M.D.   On: 03/04/2016 11:49   Dg Chest Portable 1 View 03/02/2016  CLINICAL DATA:  Shortness of breath with chest pain. History of COPD and CHF. EXAM: PORTABLE CHEST 1 VIEW COMPARISON:  08/09/2006 FINDINGS: Dual lead pacemaker remains in place. The cardiac silhouette appears mildly enlarged. There is mild pulmonary vascular congestion with new perihilar  and bibasilar lung opacities. No sizable pleural effusion or pneumothorax is identified. No acute osseous abnormality is identified. IMPRESSION: Cardiomegaly with mild perihilar and bibasilar opacities suspicious for edema. Electronically Signed   By: Logan Bores M.D.   On: 03/02/2016 16:10    EKG: V paced, underlying rhythm difficult  TELEMETRY:  PAFib,  80's, SR with VVI pacing DEVICE HISTORY:  MDT dual chamber PPM, 08/08/06 Dr. Lovena Le 03/03/16: Echocardiogram Study Conclusions - Left ventricle: The cavity size was normal. Systolic function was  normal. The estimated ejection fraction was in the range of 60%  to 65%. Wall motion was normal; there were no regional wall  motion abnormalities. - Pulmonary arteries: PA peak pressure: 34 mm Hg (S).   Assessment and Plan:  1. PPM generator at [facility] 02/29/16    Plan for generator change today  2. CHF     Overall fluid neg -2960m     Weight is down looks like about 3 pounds     Her exam appears compensated  3. Hypokalemia     Being replaced  4. PAFib     CHA2DS2Vasc is at least 5 on warfarin     INR 1.97 s/p Vit K yesterday (on admit was 2.6)     Pharmacy for heparin as ordered by cards  5. CP     No WMA on her echo     Neg Trop x3     Ischemic w/u planned by cards post gen change, will defer to them  6. HTN     controlled    Signed, RTommye Standard PHershal Coria3/27/2017 8:38 AM  EP Attending  Patient seen and examined. She is stable for PPM generator change.  GMikle BosworthD.

## 2016-03-05 NOTE — Progress Notes (Signed)
ANTICOAGULATION CONSULT NOTE - Follow Up Consult  Pharmacy Consult for Heparin Indication: atrial fibrillation  Allergies  Allergen Reactions  . Penicillins Swelling and Other (See Comments)    "Felt like brain was swelling out of her head"  . Codeine Other (See Comments)    Stomach issues  . Shellfish Allergy Hives and Itching  . Sulfonamide Derivatives Other (See Comments)    Immune to this    Patient Measurements: Height: '5\' 6"'$  (167.6 cm) Weight: 236 lb 5.3 oz (107.2 kg) IBW/kg (Calculated) : 59.3 Heparin Dosing Weight: 84 kg  Vital Signs: Temp: 98.4 F (36.9 C) (03/27 0500) Temp Source: Oral (03/27 0500) BP: 96/52 mmHg (03/27 0500) Pulse Rate: 87 (03/27 0500)  Labs:  Recent Labs  03/02/16 1600  03/02/16 1610 03/02/16 2131 03/03/16 0627 03/03/16 0829 03/04/16 0518 03/05/16 0301  HGB 12.1  --  14.3  --   --   --  11.7* 11.9*  HCT 37.8  --  42.0  --   --   --  35.7* 37.0  PLT 253  --   --   --   --   --  255 262  LABPROT 27.6*  --   --   --  27.5*  --  26.5* 22.3*  INR 2.62*  --   --   --  2.60*  --  2.47* 1.97*  CREATININE  --   < > 1.20* 1.26* 1.16*  --  1.24* 1.30*  TROPONINI  --   --   --  <0.03 <0.03 <0.03  --   --   < > = values in this interval not displayed.  Estimated Creatinine Clearance: 41.3 mL/min (by C-G formula based on Cr of 1.3).   Assessment: 82 year old female who needs a pacemaker replacement. She is on chronic anticoagulation with Coumadin for atrial fibrillation and her INR is now subtherapeutic at 1.97. Coumadin is on hold and will begin heparin drip for INR <2. No bleeding noted, CBC is stable. She received vitamin K 10 mg SQ on 3/26.  Goal of Therapy:  Heparin level 0.3-0.7 units/ml Monitor platelets by anticoagulation protocol: Yes   Plan:  Begin heparin drip at 1200 units/hr with no bolus 8 hr HL Daily HL and CBC  Ed[place]ouch, [place].D., BCPS Clinical Pharmacist Pager: [phone] 03/05/2016 8:03 AM

## 2016-03-05 NOTE — Care Management Important Message (Signed)
Important Message  Patient Details  Name: Traci [name] MRN: [id number] Date of Birth: 02-20-1932   Medicare Important Message Given:  Yes    [name] 03/05/2016, 12:56 PM

## 2016-03-05 NOTE — Progress Notes (Signed)
Dr called back notified of labwork, VS and pt's rhythm changing back into A-Fib with PVC's but VSS, verbal orders received to give PO Potassium and start runs of IV Potassium, will do as soon as it arrives from pharmacy, no other changes at this time. Patient is a slight bit nauseated with taking the po Potassium, gave in increments of half pill a a time. Patient is reading her Stress test information given to her last night, will continue to monitor.

## 2016-03-05 NOTE — Progress Notes (Signed)
Received call from pharmacy re: whether to restart heparin.  Patient had a generator change today.  She on coumadin at home. INR is 1.97. Will restart coumadin. Rounding team to see in the am

## 2016-03-05 NOTE — Progress Notes (Signed)
Subjective: No acute events overnight. Her dyspnea has improved. Denies chest pain. No other complaints.   Objective: Vital signs in last 24 hours: Filed Vitals:   03/04/16 1350 03/04/16 2000 03/05/16 0500 03/05/16 1111  BP: 103/50 100/48 96/52 110/60  Pulse: 64 64 87   Temp: 98.5 F (36.9 C) 98.3 F (36.8 C) 98.4 F (36.9 C)   TempSrc: Oral Oral Oral   Resp: '20 20 20   '$ Height:      Weight:   107.2 kg (236 lb 5.3 oz)   SpO2: 96% 95% 98%    Weight change: -0.257 kg (-9.1 oz)  Intake/Output Summary (Last 24 hours) at 03/05/16 1140 Last data filed at 03/05/16 0854  Gross per 24 hour  Intake   1160 ml  Output   1950 ml  Net   -790 ml   General Apperance: NAD HEENT: Normocephalic, atraumatic, anicteric sclera Neck: Supple, trachea midline Lungs: Clear to auscultation bilaterally. No wheezes, rhonchi or rales. Breathing comfortably Heart: Regular rate and rhythm.  Abdomen: Soft, nontender, nondistended, no rebound/guarding Extremities: Warm and well perfused, no edema Skin: No rashes or lesions Neurologic: Alert and interactive. No gross deficits.  Lab Results: Basic Metabolic Panel:  Recent Labs Lab 03/02/16 2131  03/04/16 0518 03/05/16 0301  NA 140  < > 141 137  K 3.5  < > 3.5 2.2*  CL 99*  < > 99* 95*  CO2 28  < > 31 28  GLUCOSE 141*  < > 112* 97  BUN 27*  < > 28* 33*  CREATININE 1.26*  < > 1.24* 1.30*  CALCIUM 8.8*  < > 9.1 8.7*  MG 1.8  --   --  1.9  < > = values in this interval not displayed. Liver Function Tests:  Recent Labs Lab 03/02/16 2131  AST 38  ALT 27  ALKPHOS 61  BILITOT 0.8  PROT 6.1*  ALBUMIN 3.4*    Recent Labs Lab 03/02/16 2131  LIPASE 37   CBC:  Recent Labs Lab 03/02/16 1600  03/04/16 0518 03/05/16 0301  WBC 10.8*  --  10.1 10.4  NEUTROABS 8.3*  --   --   --   HGB 12.1  < > 11.7* 11.9*  HCT 37.8  < > 35.7* 37.0  MCV 91.1  --  90.6 88.9  PLT 253  --  255 262  < > = values in this interval not displayed. Cardiac  Enzymes:  Recent Labs Lab 03/02/16 2131 03/03/16 0627 03/03/16 0829  TROPONINI <0.03 <0.03 <0.03   CBG:  Recent Labs Lab 03/02/16 2127 03/03/16 2148  GLUCAP 141* 111*   Thyroid Function Tests:  Recent Labs Lab 03/02/16 2131  TSH 4.110   Coagulation:  Recent Labs Lab 03/02/16 1600 03/03/16 0627 03/04/16 0518 03/05/16 0301  LABPROT 27.6* 27.5* 26.5* 22.3*  INR 2.62* 2.60* 2.47* 1.97*   Studies/Results: Dg Chest 2 View  03/04/2016  CLINICAL DATA:  Pacemaker battery replacement. Chest pain. Ex-smoker. Shortness of breath and weakness. EXAM: CHEST  2 VIEW COMPARISON:  03/02/2016. FINDINGS: Cardiac size within normal limits. Dual lead pacer unremarkable. RIGHT pleural effusion. Mild vascular congestion without active infiltrates or failure. Mild scarring LEFT base. Degenerative change thoracic spine. IMPRESSION: Chronic changes as described, with a RIGHT pleural effusion which is increased from radiograph 2 days ago. Electronically Signed   By: Staci Righter M.D.   On: 03/04/2016 11:49   Medications: I have reviewed the patient's current medications. Scheduled Meds: . allopurinol  300  mg Oral Daily  . amiodarone  200 mg Oral Daily  . anastrozole  1 mg Oral Daily  . diltiazem  240 mg Oral Daily  . gentamicin irrigation  80 mg Irrigation On Call  . levothyroxine  25 mcg Oral QAC breakfast  . metolazone  2.5 mg Oral Daily  . metoprolol succinate  100 mg Oral Daily  . pantoprazole  40 mg Oral Daily  . polyethylene glycol  17 g Oral Daily  . pravastatin  20 mg Oral QHS  . vancomycin  1,500 mg Intravenous On Call   Continuous Infusions: . sodium chloride    . sodium chloride 50 mL/hr at 03/05/16 0600  . heparin 1,200 Units/hr (03/05/16 1115)   PRN Meds:.HYDROcodone-acetaminophen, zolpidem Assessment/Plan: 82 year old woman with diastolic CHF presented with 4 day hx of increasing weight, dyspnea with exertion with chest pressure on exertion.  HFpEF Exacerbation: Echo  with LV EF 60-65%, no regional wall motion abnormalities. PA peak pressure 34. Patient has diuresed -2.9 L since admission. Lungs clear on exam today and no lower extremity edema.  -Cardiology following, appreciate recommendations -Discontinue Lasix 40 mg IV bid -Continue Metolazone 2.5 mg daily -Strict I&O, daily weights  Hypokalemia: K 2.2 this am. Mag 1.9.  -Replete as needed -Monitor BMP  Symptomatic bradycardia s/p pacemaker insertion:  -Plan for generator replacement today  Chest Pain: Troponin neg x 3 -Myoview tomorrow   Paroxsymal Atrial Fibrillation: CHAD-VASc score 6. Reports compliance with her medications. -Continue Amiodarone 200 mg daily -Continue Diltiazem 240 mg daily -Continue Metorpolol 100 mg daily -Warfarin held. Given 1 dose of vit K 3/26. Hep gtt when INR <2 per pharmacy.  HTN:  -Holding doxazosin -Continue dilt and metoprolol  DM II: Diet controlled. Last A1c 6.1 on 12/29/15.   CKD III: Cr 1.20 on admission. Baseline 1-1.26 with GFR 40-50. Cr 1.3 today likely in the setting of diuresis with Lasix.  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 Anastrozole 1 mg daily for 5 years started 02/17/2015 -Continue Anastrozole  HLD: Pravastatin 20 mg daily  Hypothyroidism: TSH 4.11 -Continue Synthroid 25 mcg daily  OSA: Patient reports she cannot tolerate CPAP and does not use it at home. Declined to have it while inpatient.   DVT PPx: Warfarin  Code Status: Full  Dispo: Disposition is deferred at this time, awaiting improvement of current medical problems.  Anticipated discharge in approximately 2-3 day(s).    LOS: 3 days   Shela Leff, MD 03/05/2016, 11:40 AM

## 2016-03-05 NOTE — Progress Notes (Signed)
Patient ID: Traci [name], female   DOB: [birth year], 82 y.o.   MRN: [id number] Medicine attending: I examined this patient this morning together with resident physician Dr. Shela Leff and I concur with her evaluation and management plan which we discussed together. 81 year old woman with chronic paroxysmal atrial fibrillation on chronic warfarin anticoagulation admitted on March 24 with signs and symptoms of congestive heart failure. She has a dual chamber sequential AV pacemaker. It appears that the etiology of her acute cardiac decompensation relates to malfunction of the pacer which is only pacing the ventricle. She has been evaluated by cardiology. Plan is to replace the battery of the pacemaker today. Cardiology requested a heparin bridge. She is clinically stable. Lungs are clear. Irregularly irregular cardiac rhythm. No peripheral edema.

## 2016-03-05 NOTE — Progress Notes (Signed)
ANTICOAGULATION CONSULT NOTE - Follow Up Consult  Pharmacy Consult for warfarin Indication: atrial fibrillation  Allergies  Allergen Reactions  . Penicillins Swelling and Other (See Comments)    "Felt like brain was swelling out of her head"  . Codeine Other (See Comments)    Stomach issues  . Shellfish Allergy Hives and Itching  . Sulfonamide Derivatives Other (See Comments)    Immune to this    Patient Measurements: Height: '5\' 6"'$  (167.6 cm) Weight: 236 lb 5.3 oz (107.2 kg) IBW/kg (Calculated) : 59.3 Heparin Dosing Weight: 84 kg  Vital Signs: Temp: 98 F (36.7 C) (03/27 1653) Temp Source: Oral (03/27 1653) BP: 115/52 mmHg (03/27 1653) Pulse Rate: 80 (03/27 1653)  Labs:  Recent Labs  03/02/16 2131 03/03/16 7035 03/03/16 0829 03/04/16 0518 03/05/16 0301 03/05/16 1155  HGB  --   --   --  11.7* 11.9*  --   HCT  --   --   --  35.7* 37.0  --   PLT  --   --   --  255 262  --   LABPROT  --  27.5*  --  26.5* 22.3*  --   INR  --  2.60*  --  2.47* 1.97*  --   CREATININE 1.26* 1.16*  --  1.24* 1.30* 1.18*  TROPONINI <0.03 <0.03 <0.03  --   --   --     Estimated Creatinine Clearance: 45.6 mL/min (by C-G formula based on Cr of 1.18).   Assessment: 82 year old female who needs a pacemaker replacement. She is on chronic anticoagulation with Coumadin for atrial fibrillation and her INR is now subtherapeutic at 1.97.   Pacer battery replaced 3/27 - to resume warfarin  On PTA dose: '5mg'$  on Wed/Thu/Fri, 2.5 mg all other days, last dose 3/23  Goal of Therapy:  INR 2 - 3  Plan:  Warfarin 2.5 mg x 1 tonight Daily INR  Levester Fresh, PharmD, BCPS, [facility] Clinical Pharmacist Pager [phone] 03/05/2016 6:13 PM

## 2016-03-05 NOTE — Progress Notes (Signed)
Doctor called re: critical low potassium level. Patient has been sleeping with no s/s of complications, will continue to monitor.

## 2016-03-06 ENCOUNTER — Telehealth: Payer: Self-pay | Admitting: Internal Medicine

## 2016-03-06 ENCOUNTER — Encounter (HOSPITAL_COMMUNITY): Payer: Self-pay | Admitting: Internal Medicine

## 2016-03-06 DIAGNOSIS — J810 Acute pulmonary edema: Secondary | ICD-10-CM | POA: Insufficient documentation

## 2016-03-06 DIAGNOSIS — I509 Heart failure, unspecified: Secondary | ICD-10-CM

## 2016-03-06 DIAGNOSIS — T82111A Breakdown (mechanical) of cardiac pulse generator (battery), initial encounter: Secondary | ICD-10-CM | POA: Insufficient documentation

## 2016-03-06 LAB — PROTIME-INR
INR: 1.47 (ref 0.00–1.49)
Prothrombin Time: 17.9 seconds — ABNORMAL HIGH (ref 11.6–15.2)

## 2016-03-06 LAB — BASIC METABOLIC PANEL
Anion gap: 10 (ref 5–15)
BUN: 29 mg/dL — AB (ref 6–20)
CHLORIDE: 103 mmol/L (ref 101–111)
CO2: 27 mmol/L (ref 22–32)
CREATININE: 1.03 mg/dL — AB (ref 0.44–1.00)
Calcium: 8.9 mg/dL (ref 8.9–10.3)
GFR calc Af Amer: 57 mL/min — ABNORMAL LOW (ref >60)
GFR calc non Af Amer: 49 mL/min — ABNORMAL LOW (ref >60)
GLUCOSE: 90 mg/dL (ref 65–99)
POTASSIUM: 3.2 mmol/L — AB (ref 3.5–5.1)
Sodium: 140 mmol/L (ref 135–145)

## 2016-03-06 MED ORDER — POTASSIUM CHLORIDE ER 20 MEQ PO TBCR: 80.0000 meq | EXTENDED_RELEASE_TABLET | Freq: Every day | ORAL | Status: DC

## 2016-03-06 MED ORDER — POTASSIUM CHLORIDE CRYS ER 20 MEQ PO TBCR
80.0000 meq | EXTENDED_RELEASE_TABLET | Freq: Once | ORAL | Status: AC
  Administered 2016-03-06: 80 meq via ORAL
  Filled 2016-03-06: qty 4

## 2016-03-06 MED ORDER — POTASSIUM CHLORIDE ER 20 MEQ PO TBCR
40.0000 meq | EXTENDED_RELEASE_TABLET | Freq: Two times a day (BID) | ORAL | Status: AC
Start: 2016-03-06 — End: ?

## 2016-03-06 MED ORDER — FUROSEMIDE 80 MG PO TABS
80.0000 mg | ORAL_TABLET | Freq: Every day | ORAL | Status: DC
  Filled 2016-03-06: qty 1

## 2016-03-06 MED ORDER — WARFARIN SODIUM 2.5 MG PO TABS: 2.5000 mg | ORAL_TABLET | Freq: Once | ORAL | Status: DC

## 2016-03-06 NOTE — Progress Notes (Signed)
Patient ID: Traci [name], female   DOB: [birth year], 82 y.o.   MRN: [id number] Medicine attending discharge note: I personally examined this patient today together with resident physician Dr. Shela Leff and I concur with her evaluation and discharge plan as recorded in her daily progress note dated 03/06/2016.  Clinical summary: 82 year old woman with chronic paroxysmal atrial fibrillation on chronic warfarin anticoagulation admitted on March 24 when signs and symptoms of congestive heart failure. She has a dual-chamber AV sequential pacemaker. This was placed about 10 years ago. There were no signs of acute myocardial injury. Impression was that the etiology of her acute cardiac decompensation relates to malfunction of the pacemaker which was only pacing the ventricle.  Hospital course: She was started on parenteral diuretics. She was evaluated by cardiology. Warfarin was partially reversed. She underwent uncomplicated replacement of the pacemaker battery pack on March 27. Warfarin was resumed. Exam on day of discharge with clear lungs. Irregularly irregular cardiac rhythm with controlled rate. Adjustments made in her diuretic regimen. Metolazone was stopped. Other home medications continued including Lasix, amiodarone, diltiazem, and metoprolol.  Disposition: She is stable for discharge today. She will follow-up with cardiology and in our general medical clinic. There were no complications.

## 2016-03-06 NOTE — Telephone Encounter (Signed)
Call pharmacy (pt is inpatient)

## 2016-03-06 NOTE — Telephone Encounter (Signed)
Spoke to pharm, clarified script for K+

## 2016-03-06 NOTE — Progress Notes (Signed)
SUBJECTIVE: The patient is doing well today., can tell the difference and is feeling better post gen change.   At this time, she denies chest pain, shortness of breath, or any new concerns.  Marland Kitchen allopurinol  300 mg Oral Daily  . amiodarone  200 mg Oral Daily  . anastrozole  1 mg Oral Daily  . diltiazem  240 mg Oral Daily  . levothyroxine  25 mcg Oral QAC breakfast  . metoprolol succinate  100 mg Oral Daily  . pantoprazole  40 mg Oral Daily  . polyethylene glycol  17 g Oral Daily  . pravastatin  20 mg Oral QHS  . vancomycin  1,500 mg Intravenous To Cath  . Warfarin - Pharmacist Dosing Inpatient   Does not apply q1800      OBJECTIVE: Physical Exam: Filed Vitals:   03/05/16 1630 03/05/16 1653 03/05/16 2152 03/06/16 0548  BP:  115/52 111/38 102/55  Pulse: 0 80 59 60  Temp:  98 F (36.7 C) 97.5 F (36.4 C) 98.6 F (37 C)  TempSrc:  Oral Oral Oral  Resp: 0 18  20  Height:      Weight:    236 lb 4.8 oz (107.185 kg)  SpO2: 97% 98% 95% 97%    Intake/Output Summary (Last 24 hours) at 03/06/16 0631 Last data filed at 03/05/16 1848  Gross per 24 hour  Intake    240 ml  Output    550 ml  Net   -310 ml    Telemetry reveals A paced, V sensed   GEN- The patient is well appearing, alert and oriented x 3 today.   Head- normocephalic, atraumatic Eyes-  Sclera clear, conjunctiva pink Ears- hearing intact Oropharynx- clear Neck- supple, no JVP Lungs- Clear to ausculation bilaterally, normal work of breathing Heart- Regular rate and rhythm, no significant murmurs, no rubs or gallops GI- soft, NT, ND Extremities- no clubbing, cyanosis, or edema Skin- no rash or lesion Psych- euthymic mood, full affect Neuro- no gross deficits appreciated  PPM site is dry, no hematoma  LABS: Basic Metabolic Panel:  Recent Labs  03/05/16 0301  03/05/16 2032 03/06/16 0241  NA 137  < > 138 140  K 2.2*  < > 3.0* 3.2*  CL 95*  < > 100* 103  CO2 28  < > 29 27  GLUCOSE 97  < > 108* 90    BUN 33*  < > 30* 29*  CREATININE 1.30*  < > 1.12* 1.03*  CALCIUM 8.7*  < > 8.8* 8.9  MG 1.9  --   --   --   < > = values in this interval not displayed. CBC:  Recent Labs  03/04/16 0518 03/05/16 0301  WBC 10.1 10.4  HGB 11.7* 11.9*  HCT 35.7* 37.0  MCV 90.6 88.9  PLT 255 262   Cardiac Enzymes:  Recent Labs  03/03/16 0829  TROPONINI <0.03     ASSESSMENT AND PLAN:  1. PPM generator at [facility] 02/29/16     S/p generator change yesterday with Dr. Lovena Le   site is stable     wound check 10 days scheduled and follow up scheduled     Wound care discussed with the patient  2. CHF  Overall fluid neg -2722m  Weight is down looks like about 3 pounds  Her exam appears compensated     Diuretics stopped  3. Hypokalemia  Being addressed by IM service  4. PAFib  CHA2DS2Vasc is at least 5 on warfarin  INR 1.47, warfarin resumed yesterday  Pharmacy management Followed out patient by her PMD if patient is discharged, would resume at her usual home routine (without bridging) and have INR on Friday with her PMD.  5. CP  No WMA on her echo  Neg Trop x3  Symptoms felt secondary to VVI programming.  No ischemic w/u for now.       D/w the patient, to notify us if any further symptoms   6. HTN  controlled   Ok to discharge from our standpoint Electrophysiology team to see as needed while here. Please call with questions.    Tommye Standard, PA-C 03/06/2016 6:31 AM   EP Attending  Patient seen and examined. Agree with above. Her PPM is working normally and she feels well. She may be discharged home. No indication for stress testing. She is back to baseline. We have arranged PPM followup.  Mikle Bosworth.D.

## 2016-03-06 NOTE — Progress Notes (Signed)
ANTICOAGULATION CONSULT NOTE - Follow Up Consult  Pharmacy Consult for warfarin Indication: atrial fibrillation  Allergies  Allergen Reactions  . Penicillins Swelling and Other (See Comments)    "Felt like brain was swelling out of her head"  . Codeine Other (See Comments)    Stomach issues  . Shellfish Allergy Hives and Itching  . Sulfonamide Derivatives Other (See Comments)    Immune to this    Patient Measurements: Height: '5\' 6"'$  (167.6 cm) Weight: 236 lb 4.8 oz (107.185 kg) IBW/kg (Calculated) : 59.3 Heparin Dosing Weight: 84 kg  Vital Signs: Temp: 98.6 F (37 C) (03/28 0548) Temp Source: Oral (03/28 0548) BP: 102/55 mmHg (03/28 0548) Pulse Rate: 60 (03/28 0548)  Labs:  Recent Labs  03/04/16 0518 03/05/16 0301 03/05/16 1155 03/05/16 2032 03/06/16 0241  HGB 11.7* 11.9*  --   --   --   HCT 35.7* 37.0  --   --   --   PLT 255 262  --   --   --   LABPROT 26.5* 22.3*  --   --  17.9*  INR 2.47* 1.97*  --   --  1.47  CREATININE 1.24* 1.30* 1.18* 1.12* 1.03*    Estimated Creatinine Clearance: 52.2 mL/min (by C-G formula based on Cr of 1.03).   Assessment: 81 year old female s/p pacemaker generator change yesterday. She is on chronic anticoagulation with Coumadin for atrial fibrillation and her INR is now subtherapeutic at 1.47. Warfarin was intentionally held on 3/26 in anticipation of pacemaker replacement.  On PTA dose: '5mg'$  on Wed/Thu/Fri, 2.5 mg all other days, last dose 3/23  Goal of Therapy:  INR 2 - 3  Plan:  Warfarin 2.5 mg x 1  Daily INR   Hughes Better, PharmD, BCPS Clinical Pharmacist 03/06/2016 8:33 AM

## 2016-03-06 NOTE — Telephone Encounter (Signed)
Called pharm, informed pharm that pt is inpt at the moment, will watch for disch

## 2016-03-06 NOTE — Progress Notes (Signed)
Subjective: No acute events overnight. Denies having any dyspnea or chest pain. No other complaints.   Objective: Vital signs in last 24 hours: Filed Vitals:   03/05/16 1630 03/05/16 1653 03/05/16 2152 03/06/16 0548  BP:  115/52 111/38 102/55  Pulse: 0 80 59 60  Temp:  98 F (36.7 C) 97.5 F (36.4 C) 98.6 F (37 C)  TempSrc:  Oral Oral Oral  Resp: 0 18  20  Height:      Weight:    107.185 kg (236 lb 4.8 oz)  SpO2: 97% 98% 95% 97%   Weight change: -0.015 kg (-0.5 oz)  Intake/Output Summary (Last 24 hours) at 03/06/16 2841 Last data filed at 03/05/16 1848  Gross per 24 hour  Intake    240 ml  Output    350 ml  Net   -110 ml   General Apperance: NAD HEENT: Normocephalic, atraumatic, anicteric sclera Neck: Supple, trachea midline Lungs: Lungs CTAB. No wheezes, rales, or rhonchi. Breathing comfortably. Heart: Regular rate and rhythm.  Abdomen: Soft, nontender, nondistended, no rebound/guarding Extremities: Warm and well perfused, no edema Skin: No rashes or lesions Neurologic: Alert and interactive. No gross deficits.  Lab Results: Basic Metabolic Panel:  Recent Labs Lab 03/02/16 2131  03/05/16 0301  03/05/16 2032 03/06/16 0241  NA 140  < > 137  < > 138 140  K 3.5  < > 2.2*  < > 3.0* 3.2*  CL 99*  < > 95*  < > 100* 103  CO2 28  < > 28  < > 29 27  GLUCOSE 141*  < > 97  < > 108* 90  BUN 27*  < > 33*  < > 30* 29*  CREATININE 1.26*  < > 1.30*  < > 1.12* 1.03*  CALCIUM 8.8*  < > 8.7*  < > 8.8* 8.9  MG 1.8  --  1.9  --   --   --   < > = values in this interval not displayed. Liver Function Tests:  Recent Labs Lab 03/02/16 2131  AST 38  ALT 27  ALKPHOS 61  BILITOT 0.8  PROT 6.1*  ALBUMIN 3.4*    Recent Labs Lab 03/02/16 2131  LIPASE 37   CBC:  Recent Labs Lab 03/02/16 1600  03/04/16 0518 03/05/16 0301  WBC 10.8*  --  10.1 10.4  NEUTROABS 8.3*  --   --   --   HGB 12.1  < > 11.7* 11.9*  HCT 37.8  < > 35.7* 37.0  MCV 91.1  --  90.6 88.9  PLT  253  --  255 262  < > = values in this interval not displayed. Cardiac Enzymes:  Recent Labs Lab 03/02/16 2131 03/03/16 0627 03/03/16 0829  TROPONINI <0.03 <0.03 <0.03   CBG:  Recent Labs Lab 03/02/16 2127 03/03/16 2148  GLUCAP 141* 111*   Thyroid Function Tests:  Recent Labs Lab 03/02/16 2131  TSH 4.110   Coagulation:  Recent Labs Lab 03/03/16 0627 03/04/16 0518 03/05/16 0301 03/06/16 0241  LABPROT 27.5* 26.5* 22.3* 17.9*  INR 2.60* 2.47* 1.97* 1.47   Studies/Results: Dg Chest 2 View  03/04/2016  CLINICAL DATA:  Pacemaker battery replacement. Chest pain. Ex-smoker. Shortness of breath and weakness. EXAM: CHEST  2 VIEW COMPARISON:  03/02/2016. FINDINGS: Cardiac size within normal limits. Dual lead pacer unremarkable. RIGHT pleural effusion. Mild vascular congestion without active infiltrates or failure. Mild scarring LEFT base. Degenerative change thoracic spine. IMPRESSION: Chronic changes as described, with a  RIGHT pleural effusion which is increased from radiograph 2 days ago. Electronically Signed   By: Staci Righter M.D.   On: 03/04/2016 11:49   Medications: I have reviewed the patient's current medications. Scheduled Meds: . allopurinol  300 mg Oral Daily  . amiodarone  200 mg Oral Daily  . anastrozole  1 mg Oral Daily  . diltiazem  240 mg Oral Daily  . levothyroxine  25 mcg Oral QAC breakfast  . metoprolol succinate  100 mg Oral Daily  . pantoprazole  40 mg Oral Daily  . polyethylene glycol  17 g Oral Daily  . pravastatin  20 mg Oral QHS  . vancomycin  1,500 mg Intravenous To Cath  . Warfarin - Pharmacist Dosing Inpatient   Does not apply q1800   Continuous Infusions:   PRN Meds:.HYDROcodone-acetaminophen, zolpidem Assessment/Plan: 82 year old woman with diastolic CHF presented with 4 day hx of increasing weight, dyspnea with exertion with chest pressure on exertion.  HFpEF Exacerbation: Echo with LV EF 60-65%, no regional wall motion abnormalities.  PA peak pressure 34. Patient has diuresed -2.7 L since admission. Lungs CTAB. No lower extremity edema. -Cardiology following, appreciate recommendations -Restart home dose of Lasix 80 mg QD -Continue holding Metolazone -KDur 80 meq daily  -Strict I&O, daily weights  Hypokalemia: K 3.2 this am. -Replete as needed -Monitor BMP  Symptomatic bradycardia s/p pacemaker insertion: Generator was placed yesterday. -Wound check scheduled in 10 days.   Chest Pain: Troponin neg x 3 -I was informed by cardiology physician assistant Ms. Charlcie Cradle that they are not doing a stress test at this time as inpatient.  -Suggest stress test as outpatient. Patient has been advised to follow-up with cardiology.   Paroxsymal Atrial Fibrillation: CHAD-VASc score 6. Reports compliance with her medications. -Continue Amiodarone 200 mg daily -Continue Diltiazem 240 mg daily -Continue Metorpolol 100 mg daily -Warfarin per pharmacy   HTN:  -Holding doxazosin -Continue dilt and metoprolol  DM II: Diet controlled. Last A1c 6.1 on 12/29/15.   AKI on CKD III: Cr 1.20 on admission. Baseline 1-1.26 with GFR 40-50. Cr 1.0 today.    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 Anastrozole 1 mg daily for 5 years started 02/17/2015 -Continue Anastrozole  HLD: Pravastatin 20 mg daily  Hypothyroidism: TSH 4.11 -Continue Synthroid 25 mcg daily  OSA: Patient reports she cannot tolerate CPAP and does not use it at home. Declined to have it while inpatient.   DVT PPx: Warfarin  Code Status: Full  Dispo: Disposition is deferred at this time, awaiting improvement of current medical problems.  Anticipated discharge in approximately 2-3 day(s).    LOS: 4 days   Shela Leff, MD 03/06/2016, 8:32 AM

## 2016-03-06 NOTE — Discharge Instructions (Signed)
Keep incision clean and dry for 10 days. No driving for 2 days.  Leave steri-strips (little pieces of tape) on until seen in the office for wound check appointment. Call the office [phone]) for redness, drainage, swelling, or fever.

## 2016-03-09 NOTE — Discharge Summary (Signed)
Name: Traci [name] MRN: [id number] DOB: [birth year] 82 y.o. PCP: Raelene Bott, MD  Date of Admission: 03/02/2016  3:27 PM Date of Discharge: 03/06/2016 Attending Physician: No att. providers found  Discharge Diagnosis:  Principal Problem:   CHF exacerbation ([place]) Active Problems:   DM (diabetes mellitus) type II controlled with renal manifestation ([place])   Hyperlipemia   Obstructive sleep apnea   Essential hypertension   PAROXYSMAL ATRIAL FIBRILLATION   COPD (chronic obstructive pulmonary disease) (HCC)   Breast cancer of upper-outer quadrant of right female breast (HCC)   Shortness of breath   Pain in the chest   Hypokalemia   Malfunction of biventricular cardiac pacemaker battery   Flash pulmonary edema ([place])  Discharge Medications:   Medication List    STOP taking these medications        doxazosin 2 MG tablet  Commonly known as:  CARDURA     metolazone 2.5 MG tablet  Commonly known as:  ZAROXOLYN      TAKE these medications        allopurinol 300 MG tablet  Commonly known as:  ZYLOPRIM  Take 300 mg by mouth daily.     alum hydroxide-mag trisilicate 27-03 MG Chew chewable tablet  Commonly known as:  GAVISCON  Chew 1 tablet by mouth 4 (four) times daily as needed for indigestion or heartburn.     amiodarone 200 MG tablet  Commonly known as:  PACERONE  Take 1 tablet (200 mg total) by mouth daily.     anastrozole 1 MG tablet  Commonly known as:  ARIMIDEX  Take 1 tablet (1 mg total) by mouth daily.     bismuth subsalicylate 500 XF/81WE suspension  Commonly known as:  PEPTO BISMOL  Take 30 mLs by mouth every 6 (six) hours as needed.     diltiazem 180 MG 24 hr capsule  Commonly known as:  CARDIZEM CD  TAKE ONE CAPSULE BY MOUTH ONCE DAILY     diphenhydramine-acetaminophen 25-500 MG Tabs tablet  Commonly known as:  TYLENOL PM  Take 1 tablet by mouth at bedtime as needed.     furosemide 80 MG tablet  Commonly known as:  LASIX  Take 80 mg by mouth  daily.     HYDROcodone-acetaminophen 10-325 MG tablet  Commonly known as:  NORCO  Take 1 tablet by mouth every 6 (six) hours as needed for severe pain.     levothyroxine 25 MCG tablet  Commonly known as:  SYNTHROID, LEVOTHROID  Take 25 mcg by mouth daily before breakfast.     metoprolol succinate 100 MG 24 hr tablet  Commonly known as:  TOPROL-XL  Take 100 mg by mouth daily.     nitroGLYCERIN 0.4 MG SL tablet  Commonly known as:  NITROSTAT  Place 0.4 mg under the tongue every 5 (five) minutes as needed for chest pain (MAX 3 TABLETS).     omeprazole 40 MG capsule  Commonly known as:  PRILOSEC  Take 40 mg by mouth daily.     Potassium Chloride ER 20 MEQ Tbcr  Take 40 mEq by mouth 2 (two) times daily.     pravastatin 20 MG tablet  Commonly known as:  PRAVACHOL  Take 20 mg by mouth daily.     warfarin 5 MG tablet  Commonly known as:  COUMADIN  Take 2.5-5 mg by mouth daily at 6 PM. TAKES '5MG'$  ON WED, THURS AND FRI ONLY TAKES 2.'5MG'$  ALL OTHER DAYS  Disposition and follow-up:   Traci [name] was discharged from [facility] in Good condition.  At the hospital follow up visit please address:  1.  HFpEF, PAF, symptomatic sinus node dysfunction (recent PM change), OSA She needs to follow-up with her oncologist.   2.  Labs / imaging needed at time of follow-up:   3.  Pending labs/ test needing follow-up:   Follow-up Appointments:     Follow-up Information    Follow up with [facility] On 03/15/2016.   Specialty:  Cardiology   Why:  12:00 noon, wound check   Contact information:   [address] [place] [phone]      Follow up with Cristopher Peru, MD On 06/06/2016.   Specialty:  Cardiology   Why:  3:00PM   Contact information:   [address] [place] [address] [phone]       Follow up with [facility], [place]. Go on 03/12/2016.   Specialty:  Internal Medicine    Why:  Follow up appointment at 3 pm.    Contact information:   [place] [address] [phone]       Discharge Instructions:   Keep incision clean and dry for 10 days.  No driving for 2 days.  Leave steri-strips (little pieces of tape) on until seen in the office for wound check appointment.  Call the office [phone]) for redness, drainage, swelling, or fever.   Consultations:    Procedures Performed:  Dg Chest 2 View  03/04/2016  CLINICAL DATA:  Pacemaker battery replacement. Chest pain. Ex-smoker. Shortness of breath and weakness. EXAM: CHEST  2 VIEW COMPARISON:  03/02/2016. FINDINGS: Cardiac size within normal limits. Dual lead pacer unremarkable. RIGHT pleural effusion. Mild vascular congestion without active infiltrates or failure. Mild scarring LEFT base. Degenerative change thoracic spine. IMPRESSION: Chronic changes as described, with a RIGHT pleural effusion which is increased from radiograph 2 days ago. Electronically Signed   By: Staci Righter M.D.   On: 03/04/2016 11:49   Dg Chest Portable 1 View  03/02/2016  CLINICAL DATA:  Shortness of breath with chest pain. History of COPD and CHF. EXAM: PORTABLE CHEST 1 VIEW COMPARISON:  08/09/2006 FINDINGS: Dual lead pacemaker remains in place. The cardiac silhouette appears mildly enlarged. There is mild pulmonary vascular congestion with new perihilar and bibasilar lung opacities. No sizable pleural effusion or pneumothorax is identified. No acute osseous abnormality is identified. IMPRESSION: Cardiomegaly with mild perihilar and bibasilar opacities suspicious for edema. Electronically Signed   By: Logan Bores M.D.   On: 03/02/2016 16:10   2D Echo:  03/03/16 Study Conclusions  - Left ventricle: The cavity size was normal. Systolic function was  normal. The estimated ejection fraction was in the range of 60%  to 65%. Wall motion was normal; there were no regional wall  motion abnormalities. - Pulmonary  arteries: PA peak pressure: 34 mm Hg (S).  Admission HPI: Traci [name] is an 82 year old female with a past medical history of non-ischemic diastolic congestive heart failure, paroxsymal atrial fibrillation on warfarin, symptomatic bradycardia s/p pacemaker placement, HTN, diet controlled DM II, breast cancer s/p lumpectomy, hypothyroidism, obesity, HLD, OSA presenting with complaints of shortness of breath and chest pain. She reports that her weight has been going up for the past 4 days at home. Reports her weight is usually 232-234 lbs and was 238 when she checked this morning. States  she has had decreased urine output over this period as well despite taking her lasix and metolazone as prescribed. She also reports shortness of breath with minimal exertion. States she cannot walk 5 feet without becoming short of breath. She reports she is usually able to walk at [facility] without becoming short of breath. Notes that she had an episode of non-radiating substernal chest pressure 2 nights ago with exertion. She took sub-lingual nitro with resolution of the chest pain. She went to see her PCP this afternoon and became short of breath and developed non-radiating substernal chest pressure and diaphoresis when walking from her car to the office. States her symptoms improved with rest and sublingual nitro. Denies any lightheadedness, dizziness, falls, fevers, chills, palpitations, vision changes, leg edema, nausea, vomiting or abdominal pain. She does report a headache that developed after taking the nitro as well as a non-productive cough for the past month. Denies any further chest pain since coming to the hospital.   Hospital Course by problem list: Principal Problem:   CHF exacerbation ([place]) Active Problems:   DM (diabetes mellitus) type II controlled with renal manifestation ([place])   Hyperlipemia   Obstructive sleep apnea   Essential hypertension   PAROXYSMAL ATRIAL FIBRILLATION   COPD (chronic obstructive  pulmonary disease) (HCC)   Breast cancer of upper-outer quadrant of right female breast (HCC)   Shortness of breath   Pain in the chest   Hypokalemia   Malfunction of biventricular cardiac pacemaker battery   Flash pulmonary edema (H[facility])   HFpEF Exacerbation: Patient presented with a 4 day history of decreasing urinary output, weight gain, and dyspnea with exertion. Pro-BNP was elevated at 1600 at PCP's office and BNP 560 upon admission. CXR demonstrated perihilar and bibasilar opacities suspicious for edema. Only trace pedal edema on exam but patient did have JVD present as well as bibasilar crackles. Suspicion for DVT/PE was low - Well Score 1.5. She did not have any calf pain/ swelling, recent travel, recent surgery, and is chronically on Warfarin due to history of PAF. INR was therapeutic. EKG showing atrial fibrillation. Echo done 03/03/16 showing LV EF 60-65%, no regional wall motion abnormalities. PA peak pressure 34. Her HFpEF was likely in the setting of atrial fibrillation due to lost atrial kick.  Patient was treated with Lasix and home medication Metolazone and had diuresed -2.7 L since admission. She was euvolemic on the day of discharge - lungs CTAB and no lower extremity edema. Metolazone was stopped and patient was advised to continue taking her home dose of Lasix.   History of symptomatic sinus node dysfunction Warfarin was partially reversed. Patient underwent successful removal of a medtronic DDD PM and insertion of a new medtronic DDD PM on 03/05/16. Warfarin was resumed. She has been scheduled for wound check on 03/15/16.   Chest Pain Patient reported 2 episodes of substernal chest pressure prior to admission. The pain was non-radiating, worsened with exertion and improved with rest and nitro. Troponin negative x 3. EKG with ventricular pacing and no acute ST/T wave changes. Patient did have tenderness to palpation over the sternum on exam, however, denied any trauma or falls. Patient  later told us her pain was epigastric in location and that she was told in the past it was due to esophageal spasms. She seen by cardiology during this hospitalization. They decided not to do a stress test at this time and recommended outpatient follow-up. She has been scheduled to follow-up with cardiology on 06/06/16.   Paroxsymal Atrial  Fibrillation:  Irregularly irregular cardiac rhythm with controlled rate. CHAD-VASc score 6. Patient was continued on her home medications Amiodarone, Diltiazem, Metoprolol, and Warfarin.   HTN BP initially soft upon admission with systolic in 41H. We stopped home medication Doxazosin. She was continued on Diltiazem and Metoprolol. BP stable 110/62 upon discharge.   AKI on CKD III (resolved): SCr 1.20 on admission. Baseline 1-1.2 with GFR 40-50. SCr trended down to 1.0.   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 She has been taking Anastrozole 1 mg daily for 5 years and this medication was continued during this hospitalization. Patient has been advised to follow-up with her oncologist as outpatient.   Hypothyroidism: TSH checked during this hospitalization was 4.11. She was continued on her home medications Synthroid.   OSA: Patient reports she cannot tolerate CPAP and does not use it at home. Declined to have it while inpatient.   Discharge Vitals:   BP 110/62 mmHg  Pulse 60  Temp(Src) 98.6 F (37 C) (Oral)  Resp 20  Ht '5\' 6"'$  (1.676 m)  Wt 236 lb 4.8 oz (107.185 kg)  BMI 38.16 kg/m2  SpO2 97%  Discharge Labs:  No results found for this or any previous visit (from the past 24 hour(s)).  Signed: Shela Leff, MD 03/09/2016, 1:40 PM    Services Ordered on Discharge:  Equipment Ordered on Discharge:

## 2016-03-15 ENCOUNTER — Encounter: Payer: Self-pay | Admitting: Internal Medicine

## 2016-03-15 ENCOUNTER — Ambulatory Visit (INDEPENDENT_AMBULATORY_CARE_PROVIDER_SITE_OTHER): Payer: Medicare Other | Admitting: *Deleted

## 2016-03-15 DIAGNOSIS — Z950 Presence of cardiac pacemaker: Secondary | ICD-10-CM | POA: Diagnosis not present

## 2016-03-15 LAB — CUP PACEART INCLINIC DEVICE CHECK
Battery Remaining Longevity: 145 mo
Brady Statistic AP VP Percent: 0 %
Brady Statistic AS VS Percent: 1 %
Implantable Lead Implant Date: 20070830
Implantable Lead Location: 753859
Implantable Lead Location: 753860
Implantable Lead Model: 5076
Lead Channel Impedance Value: 1461 Ohm
Lead Channel Impedance Value: 480 Ohm
Lead Channel Pacing Threshold Amplitude: 1.25 V
Lead Channel Pacing Threshold Amplitude: 1.5 V
Lead Channel Pacing Threshold Pulse Width: 0.4 ms
Lead Channel Sensing Intrinsic Amplitude: 1.4 mV
Lead Channel Sensing Intrinsic Amplitude: 8 mV
MDC IDC LEAD IMPLANT DT: 20070830
MDC IDC MSMT BATTERY IMPEDANCE: 100 Ohm
MDC IDC MSMT BATTERY VOLTAGE: 2.79 V
MDC IDC MSMT LEADCHNL RA PACING THRESHOLD PULSEWIDTH: 0.4 ms
MDC IDC SESS DTM: 20170406144005
MDC IDC SET LEADCHNL RA PACING AMPLITUDE: 2.25 V
MDC IDC SET LEADCHNL RV PACING AMPLITUDE: 2.5 V
MDC IDC SET LEADCHNL RV PACING PULSEWIDTH: 0.4 ms
MDC IDC SET LEADCHNL RV SENSING SENSITIVITY: 4 mV
MDC IDC STAT BRADY AP VS PERCENT: 98 %
MDC IDC STAT BRADY AS VP PERCENT: 0 %

## 2016-03-15 NOTE — Progress Notes (Signed)
Wound check appointment. Steri-strips removed. Wound without redness or edema. Incision edges approximated, wound well healed. Normal device function. Thresholds, sensing, and impedances consistent with implant measurements. Device programmed at appropriate safety margins. Histogram distribution appropriate for patient and level of activity. 7 mode switches AT/AF burden 2.9%, EGMs appear AF+ Coumadin. No high ventricular rates noted. Patient educated about wound care, arm mobility, lifting restrictions. ROV in 3 months with GT.

## 2016-03-28 ENCOUNTER — Other Ambulatory Visit: Payer: Self-pay | Admitting: Internal Medicine

## 2016-05-04 ENCOUNTER — Telehealth: Payer: Self-pay | Admitting: *Deleted

## 2016-05-04 NOTE — Telephone Encounter (Signed)
Call from Dondra Prader at [facility]. Work up of patient included chest CT which showed enlarged mediastinal lymph nodes. Discussed with Dr. Jana Hakim and advised for copy of scan to be placed on Dr. Geralyn Flash desk and he will decide further testing.

## 2016-05-08 ENCOUNTER — Emergency Department (HOSPITAL_COMMUNITY): Payer: Medicare Other

## 2016-05-08 ENCOUNTER — Inpatient Hospital Stay (HOSPITAL_COMMUNITY)
Admission: EM | Admit: 2016-05-08 | Discharge: 2016-05-11 | DRG: 186 | Disposition: A | Discharge status: Home Health | Payer: Medicare Other | Attending: Internal Medicine | Admitting: Internal Medicine

## 2016-05-08 ENCOUNTER — Encounter (HOSPITAL_COMMUNITY): Payer: Self-pay | Admitting: Emergency Medicine

## 2016-05-08 ENCOUNTER — Telehealth: Payer: Self-pay | Admitting: *Deleted

## 2016-05-08 ENCOUNTER — Other Ambulatory Visit: Payer: Self-pay

## 2016-05-08 DIAGNOSIS — R0602 Shortness of breath: Secondary | ICD-10-CM | POA: Diagnosis present

## 2016-05-08 DIAGNOSIS — Z66 Do not resuscitate: Secondary | ICD-10-CM | POA: Diagnosis present

## 2016-05-08 DIAGNOSIS — J90 Pleural effusion, not elsewhere classified: Principal | ICD-10-CM

## 2016-05-08 DIAGNOSIS — R590 Localized enlarged lymph nodes: Secondary | ICD-10-CM | POA: Diagnosis present

## 2016-05-08 DIAGNOSIS — I110 Hypertensive heart disease with heart failure: Secondary | ICD-10-CM | POA: Diagnosis present

## 2016-05-08 DIAGNOSIS — Z853 Personal history of malignant neoplasm of breast: Secondary | ICD-10-CM

## 2016-05-08 DIAGNOSIS — E1121 Type 2 diabetes mellitus with diabetic nephropathy: Secondary | ICD-10-CM | POA: Diagnosis not present

## 2016-05-08 DIAGNOSIS — E785 Hyperlipidemia, unspecified: Secondary | ICD-10-CM | POA: Diagnosis present

## 2016-05-08 DIAGNOSIS — J948 Other specified pleural conditions: Secondary | ICD-10-CM | POA: Diagnosis not present

## 2016-05-08 DIAGNOSIS — I5032 Chronic diastolic (congestive) heart failure: Secondary | ICD-10-CM | POA: Diagnosis present

## 2016-05-08 DIAGNOSIS — Z6838 Body mass index (BMI) 38.0-38.9, adult: Secondary | ICD-10-CM | POA: Diagnosis not present

## 2016-05-08 DIAGNOSIS — Z87891 Personal history of nicotine dependence: Secondary | ICD-10-CM | POA: Diagnosis not present

## 2016-05-08 DIAGNOSIS — Z7901 Long term (current) use of anticoagulants: Secondary | ICD-10-CM

## 2016-05-08 DIAGNOSIS — C50411 Malignant neoplasm of upper-outer quadrant of right female breast: Secondary | ICD-10-CM | POA: Diagnosis not present

## 2016-05-08 DIAGNOSIS — J449 Chronic obstructive pulmonary disease, unspecified: Secondary | ICD-10-CM | POA: Diagnosis present

## 2016-05-08 DIAGNOSIS — I440 Atrioventricular block, first degree: Secondary | ICD-10-CM | POA: Diagnosis present

## 2016-05-08 DIAGNOSIS — K219 Gastro-esophageal reflux disease without esophagitis: Secondary | ICD-10-CM | POA: Diagnosis present

## 2016-05-08 DIAGNOSIS — R0902 Hypoxemia: Secondary | ICD-10-CM | POA: Diagnosis present

## 2016-05-08 DIAGNOSIS — E039 Hypothyroidism, unspecified: Secondary | ICD-10-CM | POA: Diagnosis present

## 2016-05-08 DIAGNOSIS — J9621 Acute and chronic respiratory failure with hypoxia: Secondary | ICD-10-CM | POA: Diagnosis present

## 2016-05-08 DIAGNOSIS — Z86718 Personal history of other venous thrombosis and embolism: Secondary | ICD-10-CM

## 2016-05-08 DIAGNOSIS — Z950 Presence of cardiac pacemaker: Secondary | ICD-10-CM | POA: Diagnosis not present

## 2016-05-08 DIAGNOSIS — I480 Paroxysmal atrial fibrillation: Secondary | ICD-10-CM | POA: Diagnosis present

## 2016-05-08 DIAGNOSIS — I10 Essential (primary) hypertension: Secondary | ICD-10-CM | POA: Diagnosis not present

## 2016-05-08 DIAGNOSIS — E669 Obesity, unspecified: Secondary | ICD-10-CM | POA: Diagnosis present

## 2016-05-08 DIAGNOSIS — D72829 Elevated white blood cell count, unspecified: Secondary | ICD-10-CM | POA: Diagnosis present

## 2016-05-08 DIAGNOSIS — E1129 Type 2 diabetes mellitus with other diabetic kidney complication: Secondary | ICD-10-CM | POA: Diagnosis present

## 2016-05-08 DIAGNOSIS — G4733 Obstructive sleep apnea (adult) (pediatric): Secondary | ICD-10-CM | POA: Diagnosis present

## 2016-05-08 DIAGNOSIS — D0511 Intraductal carcinoma in situ of right breast: Secondary | ICD-10-CM | POA: Diagnosis not present

## 2016-05-08 DIAGNOSIS — I4891 Unspecified atrial fibrillation: Secondary | ICD-10-CM | POA: Diagnosis present

## 2016-05-08 HISTORY — DX: Low back pain, unspecified: M54.50

## 2016-05-08 HISTORY — DX: Pleural effusion, not elsewhere classified: J90

## 2016-05-08 HISTORY — DX: Personal history of other diseases of the musculoskeletal system and connective tissue: Z87.39

## 2016-05-08 HISTORY — DX: Cervicalgia: M54.2

## 2016-05-08 HISTORY — DX: Major depressive disorder, single episode, unspecified: F32.9

## 2016-05-08 HISTORY — DX: Other chronic pain: G89.29

## 2016-05-08 HISTORY — DX: Pneumonia, unspecified organism: J18.9

## 2016-05-08 HISTORY — DX: Calculus of kidney: N20.0

## 2016-05-08 HISTORY — DX: Depression, unspecified: F32.A

## 2016-05-08 HISTORY — DX: Type 2 diabetes mellitus without complications: E11.9

## 2016-05-08 HISTORY — DX: Malignant neoplasm of unspecified site of right female breast: C50.911

## 2016-05-08 HISTORY — DX: Low back pain: M54.5

## 2016-05-08 LAB — COMPREHENSIVE METABOLIC PANEL
ALBUMIN: 3.4 g/dL — AB (ref 3.5–5.0)
ALT: 21 U/L (ref 14–54)
ANION GAP: 9 (ref 5–15)
AST: 29 U/L (ref 15–41)
Alkaline Phosphatase: 76 U/L (ref 38–126)
BILIRUBIN TOTAL: 0.9 mg/dL (ref 0.3–1.2)
BUN: 15 mg/dL (ref 6–20)
CHLORIDE: 102 mmol/L (ref 101–111)
CO2: 26 mmol/L (ref 22–32)
Calcium: 9.3 mg/dL (ref 8.9–10.3)
Creatinine, Ser: 1 mg/dL (ref 0.44–1.00)
GFR calc Af Amer: 59 mL/min — ABNORMAL LOW (ref >60)
GFR calc non Af Amer: 51 mL/min — ABNORMAL LOW (ref >60)
GLUCOSE: 118 mg/dL — AB (ref 65–99)
POTASSIUM: 4.5 mmol/L (ref 3.5–5.1)
SODIUM: 137 mmol/L (ref 135–145)
TOTAL PROTEIN: 6.6 g/dL (ref 6.5–8.1)

## 2016-05-08 LAB — CBC WITH DIFFERENTIAL/PLATELET
BASOS PCT: 0 %
Basophils Absolute: 0 10*3/uL (ref 0.0–0.1)
EOS ABS: 0.2 10*3/uL (ref 0.0–0.7)
EOS PCT: 1 %
HEMATOCRIT: 32.8 % — AB (ref 36.0–46.0)
Hemoglobin: 10.2 g/dL — ABNORMAL LOW (ref 12.0–15.0)
Lymphocytes Relative: 9 %
Lymphs Abs: 1.3 10*3/uL (ref 0.7–4.0)
MCH: 27.6 pg (ref 26.0–34.0)
MCHC: 31.1 g/dL (ref 30.0–36.0)
MCV: 88.9 fL (ref 78.0–100.0)
MONO ABS: 0.9 10*3/uL (ref 0.1–1.0)
MONOS PCT: 7 %
Neutro Abs: 11.7 10*3/uL — ABNORMAL HIGH (ref 1.7–7.7)
Neutrophils Relative %: 83 %
PLATELETS: 329 10*3/uL (ref 150–400)
RBC: 3.69 MIL/uL — ABNORMAL LOW (ref 3.87–5.11)
RDW: 14.8 % (ref 11.5–15.5)
WBC: 14.2 10*3/uL — AB (ref 4.0–10.5)

## 2016-05-08 LAB — PROTIME-INR
INR: 2.58 — AB (ref 0.00–1.49)
Prothrombin Time: 27.3 seconds — ABNORMAL HIGH (ref 11.6–15.2)

## 2016-05-08 LAB — BRAIN NATRIURETIC PEPTIDE: B Natriuretic Peptide: 69.2 pg/mL (ref 0.0–100.0)

## 2016-05-08 LAB — GLUCOSE, CAPILLARY: GLUCOSE-CAPILLARY: 115 mg/dL — AB (ref 65–99)

## 2016-05-08 LAB — I-STAT CG4 LACTIC ACID, ED
LACTIC ACID, VENOUS: 0.95 mmol/L (ref 0.5–2.0)
Lactic Acid, Venous: 1.64 mmol/L (ref 0.5–2.0)

## 2016-05-08 LAB — I-STAT TROPONIN, ED: Troponin i, poc: 0 ng/mL (ref 0.00–0.08)

## 2016-05-08 LAB — CBG MONITORING, ED: Glucose-Capillary: 103 mg/dL — ABNORMAL HIGH (ref 65–99)

## 2016-05-08 LAB — TROPONIN I

## 2016-05-08 MED ORDER — ONDANSETRON HCL 4 MG PO TABS: 4.0000 mg | ORAL_TABLET | Freq: Four times a day (QID) | ORAL | Status: DC | PRN

## 2016-05-08 MED ORDER — HYDROCODONE-ACETAMINOPHEN 10-325 MG PO TABS
1.0000 | ORAL_TABLET | Freq: Four times a day (QID) | ORAL | Status: DC | PRN
  Administered 2016-05-08 – 2016-05-10 (×3): 1 via ORAL
  Filled 2016-05-08 (×3): qty 1

## 2016-05-08 MED ORDER — AMIODARONE HCL 200 MG PO TABS
200.0000 mg | ORAL_TABLET | Freq: Every day | ORAL | Status: DC
  Administered 2016-05-09 – 2016-05-11 (×3): 200 mg via ORAL
  Filled 2016-05-08 (×3): qty 1

## 2016-05-08 MED ORDER — METOPROLOL SUCCINATE ER 25 MG PO TB24
100.0000 mg | ORAL_TABLET | Freq: Every day | ORAL | Status: DC
  Administered 2016-05-09 – 2016-05-11 (×3): 100 mg via ORAL
  Filled 2016-05-08 (×3): qty 4

## 2016-05-08 MED ORDER — ALLOPURINOL 100 MG PO TABS
300.0000 mg | ORAL_TABLET | Freq: Every day | ORAL | Status: DC
  Administered 2016-05-09 – 2016-05-11 (×3): 300 mg via ORAL
  Filled 2016-05-08 (×3): qty 3

## 2016-05-08 MED ORDER — ACETAMINOPHEN 325 MG PO TABS: 650.0000 mg | ORAL_TABLET | Freq: Four times a day (QID) | ORAL | Status: DC | PRN

## 2016-05-08 MED ORDER — INSULIN ASPART 100 UNIT/ML ~~LOC~~ SOLN: 0.0000 [IU] | Freq: Three times a day (TID) | SUBCUTANEOUS | Status: DC

## 2016-05-08 MED ORDER — DIPHENHYDRAMINE-APAP (SLEEP) 25-500 MG PO TABS
1.0000 | ORAL_TABLET | Freq: Every evening | ORAL | Status: DC | PRN
  Filled 2016-05-08: qty 1

## 2016-05-08 MED ORDER — DOXAZOSIN MESYLATE 2 MG PO TABS
2.0000 mg | ORAL_TABLET | Freq: Every day | ORAL | Status: DC
  Administered 2016-05-09 – 2016-05-10 (×3): 2 mg via ORAL
  Filled 2016-05-08 (×4): qty 1

## 2016-05-08 MED ORDER — IOPAMIDOL (ISOVUE-300) INJECTION 61%
INTRAVENOUS | Status: AC
  Administered 2016-05-08: 75 mL
  Filled 2016-05-08: qty 75

## 2016-05-08 MED ORDER — ANASTROZOLE 1 MG PO TABS
1.0000 mg | ORAL_TABLET | Freq: Every day | ORAL | Status: DC
  Administered 2016-05-09 – 2016-05-11 (×3): 1 mg via ORAL
  Filled 2016-05-08 (×3): qty 1

## 2016-05-08 MED ORDER — LEVOTHYROXINE SODIUM 25 MCG PO TABS
25.0000 ug | ORAL_TABLET | Freq: Every day | ORAL | Status: DC
  Administered 2016-05-09 – 2016-05-11 (×3): 25 ug via ORAL
  Filled 2016-05-08 (×3): qty 1

## 2016-05-08 MED ORDER — VITAMIN K1 10 MG/ML IJ SOLN
5.0000 mg | Freq: Once | INTRAVENOUS | Status: AC
  Administered 2016-05-08: 5 mg via INTRAVENOUS
  Filled 2016-05-08: qty 0.5

## 2016-05-08 MED ORDER — PANTOPRAZOLE SODIUM 40 MG PO TBEC
40.0000 mg | DELAYED_RELEASE_TABLET | Freq: Every day | ORAL | Status: DC
  Administered 2016-05-09 – 2016-05-11 (×3): 40 mg via ORAL
  Filled 2016-05-08 (×3): qty 1

## 2016-05-08 MED ORDER — ALUM HYDROXIDE-MAG TRISILICATE 80-20 MG PO CHEW
1.0000 | CHEWABLE_TABLET | Freq: Four times a day (QID) | ORAL | Status: DC | PRN
  Administered 2016-05-10 – 2016-05-11 (×3): 1 via ORAL
  Filled 2016-05-08 (×6): qty 1

## 2016-05-08 MED ORDER — ALBUTEROL SULFATE (2.5 MG/3ML) 0.083% IN NEBU: 2.5000 mg | INHALATION_SOLUTION | RESPIRATORY_TRACT | Status: DC | PRN

## 2016-05-08 MED ORDER — PRAVASTATIN SODIUM 40 MG PO TABS
20.0000 mg | ORAL_TABLET | Freq: Every day | ORAL | Status: DC
  Administered 2016-05-08 – 2016-05-10 (×3): 20 mg via ORAL
  Filled 2016-05-08 (×3): qty 1

## 2016-05-08 MED ORDER — FUROSEMIDE 20 MG PO TABS
80.0000 mg | ORAL_TABLET | Freq: Every day | ORAL | Status: DC
  Administered 2016-05-09 – 2016-05-11 (×3): 80 mg via ORAL
  Filled 2016-05-08 (×3): qty 4

## 2016-05-08 MED ORDER — SODIUM CHLORIDE 0.9% FLUSH
3.0000 mL | Freq: Two times a day (BID) | INTRAVENOUS | Status: DC
  Administered 2016-05-08 – 2016-05-11 (×6): 3 mL via INTRAVENOUS

## 2016-05-08 MED ORDER — NITROGLYCERIN 0.4 MG SL SUBL: 0.4000 mg | SUBLINGUAL_TABLET | SUBLINGUAL | Status: DC | PRN

## 2016-05-08 MED ORDER — ACETAMINOPHEN 650 MG RE SUPP: 650.0000 mg | Freq: Four times a day (QID) | RECTAL | Status: DC | PRN

## 2016-05-08 MED ORDER — ONDANSETRON HCL 4 MG/2ML IJ SOLN
4.0000 mg | Freq: Four times a day (QID) | INTRAMUSCULAR | Status: DC | PRN
  Administered 2016-05-09: 4 mg via INTRAVENOUS
  Filled 2016-05-08: qty 2

## 2016-05-08 MED ORDER — GUAIFENESIN ER 600 MG PO TB12
600.0000 mg | ORAL_TABLET | Freq: Two times a day (BID) | ORAL | Status: DC
  Administered 2016-05-08 – 2016-05-11 (×6): 600 mg via ORAL
  Filled 2016-05-08 (×6): qty 1

## 2016-05-08 MED ORDER — BISMUTH SUBSALICYLATE 262 MG/15ML PO SUSP: 30.0000 mL | Freq: Four times a day (QID) | ORAL | Status: DC | PRN

## 2016-05-08 MED ORDER — INSULIN ASPART 100 UNIT/ML ~~LOC~~ SOLN: 0.0000 [IU] | Freq: Every day | SUBCUTANEOUS | Status: DC

## 2016-05-08 MED ORDER — DILTIAZEM HCL ER COATED BEADS 180 MG PO CP24
180.0000 mg | ORAL_CAPSULE | Freq: Every day | ORAL | Status: DC
  Administered 2016-05-09 – 2016-05-11 (×3): 180 mg via ORAL
  Filled 2016-05-08 (×3): qty 1

## 2016-05-08 NOTE — Telephone Encounter (Signed)
"  Priscille Kluver NP. [facility] called reporting patient inoculated medial infusion of lung looks worse.  She needs to be admitted.  Do I send her to your ED or [facility]?"  Call transferred to collaborative at 1102.

## 2016-05-08 NOTE — ED Provider Notes (Signed)
CSN: [id number]     Arrival date & time 05/08/16  1254 History   First MD Initiated Contact with Patient 05/08/16 1548     Chief Complaint  Patient presents with  . Cough     (Consider location/radiation/quality/duration/timing/severity/associated sxs/prior Treatment) Patient is a 82 y.o. female presenting with cough. The history is provided by the patient (The patient complains of cough and shortness of breath going on for a couple weeks now.).  Cough Cough characteristics:  Non-productive Severity:  Moderate Onset quality:  Sudden Timing:  Constant Progression:  Worsening Chronicity:  New Relieved by:  Nothing Worsened by:  Nothing tried Associated symptoms: shortness of breath   Associated symptoms: no chest pain, no eye discharge, no headaches and no rash     Past Medical History  Diagnosis Date  . DOE (dyspnea on exertion)   . COPD (chronic obstructive pulmonary disease) ([place])   . Anxiety   . CHF (congestive heart failure) ([place])   . Diverticulitis of colon   . Paroxysmal atrial fibrillation ([facility])   . HTN (hypertension)   . Obesity   . Hyperlipidemia   . DM (diabetes mellitus) ([place])   . Full dentures   . Wears glasses   . OSA (obstructive sleep apnea)     has a cpap-cannot use  . GERD (gastroesophageal reflux disease)   . Arthritis   . History of DVT (deep vein thrombosis)   . Presence of permanent cardiac pacemaker   . Hypothyroidism   . Cancer [facility])     right breast cancer  . Hypokalemia 03/05/2016  . Sick sinus syndrome ([place]) 03/05/16    MDT PPM gen change Dr. Lovena Le   Past Surgical History  Procedure Laterality Date  . Abdominal hysterectomy    . C-spine surgery  1992    cervical disk  . Lower back surgery  1980  . Esophagogastroduodenoscopy    . Dilation and curettage of uterus    . Cardiac catheterization  2005  . Pacemaker insertion  2007  . Colonoscopy    . Breast lumpectomy with radioactive seed localization Right 02/08/2015    Procedure: BREAST  LUMPECTOMY WITH RADIOACTIVE SEED LOCALIZATION;  Surgeon: Stark Klein, MD;  Location: [place];  Service: General;  Laterality: Right;  . Re-excision of breast lumpectomy Right 02/14/2015    Procedure: RE-EXCISION OF RIGHT BREAST LUMPECTOMY;  Surgeon: Stark Klein, MD;  Location: [place];  Service: General;  Laterality: Right;  . Cholecystectomy    . Back surgery    . Ep implantable device N/A 03/05/2016    MDT PPM gen change   Family History  Problem Relation Age of Onset  . Heart failure Mother   . Pancreatic cancer Father 6  . Vaginal cancer Sister 90  . Hodgkin's lymphoma Sister 8  . Lung cancer Sister 32   Social History  Substance Use Topics  . Smoking status: Former Smoker -- 1.00 packs/day for 50 years    Quit date: 12/10/1996  . Smokeless tobacco: None     Comment: gerater than 50 pack year history of tobacco-quit in 1998  . Alcohol Use: No   OB History    No data available     Review of Systems  Constitutional: Negative for appetite change and fatigue.  HENT: Negative for congestion, ear discharge and sinus pressure.   Eyes: Negative for discharge.  Respiratory: Positive for cough and shortness of breath.   Cardiovascular: Negative for chest pain.  Gastrointestinal: Negative for abdominal  pain and diarrhea.  Genitourinary: Negative for frequency and hematuria.  Musculoskeletal: Negative for back pain.  Skin: Negative for rash.  Neurological: Negative for seizures and headaches.  Psychiatric/Behavioral: Negative for hallucinations.      Allergies  Penicillins; Codeine; Shellfish allergy; and Sulfonamide derivatives  Home Medications   Prior to Admission medications   Medication Sig Start Date End Date Taking? Authorizing Provider  allopurinol (ZYLOPRIM) 300 MG tablet Take 300 mg by mouth daily.   Yes Historical Provider, MD  alum hydroxide-mag trisilicate (GAVISCON) 38-75 MG CHEW chewable tablet Chew 1 tablet by mouth 4  (four) times daily as needed for indigestion or heartburn.   Yes Historical Provider, MD  amiodarone (PACERONE) 200 MG tablet Take 1 tablet (200 mg total) by mouth daily. 11/19/13  Yes Thompson Grayer, MD  anastrozole (ARIMIDEX) 1 MG tablet Take 1 tablet (1 mg total) by mouth daily. 01/06/16  Yes Nicholas Lose, MD  bismuth subsalicylate (PEPTO BISMOL) 262 MG/15ML suspension Take 30 mLs by mouth every 6 (six) hours as needed.   Yes Historical Provider, MD  diltiazem (CARDIZEM CD) 180 MG 24 hr capsule TAKE ONE CAPSULE BY MOUTH ONCE DAILY 01/26/15  Yes Historical Provider, MD  diphenhydramine-acetaminophen (TYLENOL PM) 25-500 MG TABS tablet Take 1 tablet by mouth at bedtime as needed.   Yes Historical Provider, MD  furosemide (LASIX) 80 MG tablet Take 80 mg by mouth daily.   Yes Historical Provider, MD  HYDROcodone-acetaminophen (NORCO) 10-325 MG per tablet Take 1 tablet by mouth every 6 (six) hours as needed for severe pain. 02/14/15  Yes Stark Klein, MD  levothyroxine (SYNTHROID, LEVOTHROID) 25 MCG tablet Take 25 mcg by mouth daily before breakfast.  01/23/15  Yes Historical Provider, MD  metoprolol (TOPROL-XL) 100 MG 24 hr tablet Take 100 mg by mouth daily.     Yes Historical Provider, MD  nitroGLYCERIN (NITROSTAT) 0.4 MG SL tablet Place 0.4 mg under the tongue every 5 (five) minutes as needed for chest pain (MAX 3 TABLETS).    Yes Historical Provider, MD  omeprazole (PRILOSEC) 40 MG capsule Take 40 mg by mouth daily.   Yes Historical Provider, MD  Potassium Chloride ER 20 MEQ TBCR Take 40 mEq by mouth 2 (two) times daily. 03/06/16  Yes Shela Leff, MD  pravastatin (PRAVACHOL) 20 MG tablet Take 20 mg by mouth daily.  01/12/15  Yes Historical Provider, MD  warfarin (COUMADIN) 5 MG tablet Take 2.5-5 mg by mouth daily at 6 PM. TAKES '5MG'$  ON WED, THURS AND FRI ONLY TAKES 2.'5MG'$  ALL OTHER DAYS   Yes Historical Provider, MD   BP 134/61 mmHg  Pulse 76  Temp(Src) 98.6 F (37 C) (Oral)  Resp 22  Wt 238 lb  (107.956 kg)  SpO2 98% Physical Exam  Constitutional: She is oriented to person, place, and time. She appears well-developed.  HENT:  Head: Normocephalic.  Eyes: Conjunctivae and EOM are normal. No scleral icterus.  Neck: Neck supple. No thyromegaly present.  Cardiovascular: Normal rate and regular rhythm.  Exam reveals no gallop and no friction rub.   No murmur heard. Pulmonary/Chest: No stridor. She has no wheezes. She has no rales. She exhibits no tenderness.  Decreased breath sounds right base  Abdominal: She exhibits no distension. There is no tenderness. There is no rebound.  Musculoskeletal: Normal range of motion. She exhibits no edema.  Lymphadenopathy:    She has no cervical adenopathy.  Neurological: She is oriented to person, place, and time. She exhibits normal muscle tone. Coordination  normal.  Skin: No rash noted. No erythema.  Psychiatric: She has a normal mood and affect. Her behavior is normal.    ED Course  Procedures (including critical care time) Labs Review Labs Reviewed  CBC WITH DIFFERENTIAL/PLATELET - Abnormal; Notable for the following:    WBC 14.2 (*)    RBC 3.69 (*)    Hemoglobin 10.2 (*)    HCT 32.8 (*)    Neutro Abs 11.7 (*)    All other components within normal limits  COMPREHENSIVE METABOLIC PANEL - Abnormal; Notable for the following:    Glucose, Bld 118 (*)    Albumin 3.4 (*)    GFR calc non Af Amer 51 (*)    GFR calc Af Amer 59 (*)    All other components within normal limits  PROTIME-INR - Abnormal; Notable for the following:    Prothrombin Time 27.3 (*)    INR 2.58 (*)    All other components within normal limits  CBG MONITORING, ED - Abnormal; Notable for the following:    Glucose-Capillary 103 (*)    All other components within normal limits  BRAIN NATRIURETIC PEPTIDE  I-STAT CG4 LACTIC ACID, ED  I-STAT CG4 LACTIC ACID, ED  Randolm Idol, ED    Imaging Review Dg Chest 2 View  05/08/2016  CLINICAL DATA:  Shortness of breath  for 2 weeks EXAM: CHEST  2 VIEW COMPARISON:  05/03/2016 FINDINGS: Cardiac shadow is mildly enlarged. The left lung remains clear. An enlarging right-sided pleural effusion is noted with underlying atelectasis. No bony abnormality is noted. A pacing device is again seen. IMPRESSION: Enlarging right-sided pleural effusion Electronically Signed   By: Inez Catalina M.D.   On: 05/08/2016 14:20   Ct Chest W Contrast  05/08/2016  CLINICAL DATA:  Increasing pneumonia, shortness of breath and productive cough. EXAM: CT CHEST WITH CONTRAST TECHNIQUE: Multidetector CT imaging of the chest was performed during intravenous contrast administration. CONTRAST:  49m ISOVUE-300 IOPAMIDOL (ISOVUE-300) INJECTION 61% COMPARISON:  Chest CT dated 05/03/2016. FINDINGS: Mediastinum/Lymph Nodes: Again noted is the multiple large lymph nodes within the mediastinum. Largest lymph node is again seen within the right lower peritracheal space measuring 3.3 cm short axis dimension. Additional confluent lymphadenopathy is again seen within the sub- carinal space measuring approximately 3 cm short axis dimension. An additional abnormal lymph node in the prevascular space measures 1.3 cm short axis dimension. Several other smaller lymph nodes are noted. There is an irregular mass along the right lateral margin of the upper mediastinum, measuring 2.2 x 1.5 cm (series 201, image 16) which could be either a mediastinal mass or parenchymal mass. Heart size is normal. Trace pericardial effusion again appreciated. Scattered atherosclerotic changes are seen along the walls of the normal-caliber thoracic aorta. No aortic aneurysm or dissection. Both thyroid lobes appear enlarged and slightly heterogeneous. A 1.2 cm nodule in the left thyroid lobe was better seen on the earlier chest CT. Lungs/Pleura: The moderate-to-large right pleural effusion appears stable in size and extent, at least partially loculated, with adjacent compressive atelectasis which  also appears stable in extent. No new lung findings. Milder atelectasis noted at the left lung base. Small emphysematous blebs noted at each lung apex. Upper abdomen: Limited images of the upper abdomen are unremarkable. Again noted is a left renal cyst, incompletely imaged. Again noted is the nodule within the left adrenal gland measuring 2.1 x 1.3 cm. Musculoskeletal: Degenerative changes are seen throughout the thoracolumbar spine. No acute or suspicious osseous lesion. Superficial soft  tissues are unremarkable. IMPRESSION: 1. Right pleural effusion, moderate to large in size, stable compared to earlier chest CT of 05/03/2016. The pleural effusion again appears to be at least partially loculated. The adjacent compressive atelectasis is stable in extent. No evidence of pneumonia seen. 2. Milder atelectasis at the left lung base. Left lung otherwise clear. 3. Extensive mediastinal lymphadenopathy. As described on the earlier CT report, the large size of the mediastinal lymph nodes suggests metastatic disease or lymphoma. 4. Irregular nodule/mass bordering the right lateral margin of the upper mediastinum, measuring 2.2 x 1.5 cm, additional mediastinal mass versus neoplastic pulmonary mass. 5. Trace pericardial effusion, stable.  Heart size is normal. 6. Heterogeneously enlarged thyroid. 7. Left adrenal mass, measuring 2.1 cm, adenoma versus metastasis. Electronically Signed   By: Franki Cabot M.D.   On: 05/08/2016 18:57   I have personally reviewed and evaluated these images and lab results as part of my medical decision-making.   EKG Interpretation   Date/Time:  Tuesday May 08 2016 16:14:53 EDT Ventricular Rate:  66 PR Interval:  274 QRS Duration: 98 QT Interval:  419 QTC Calculation: 439 R Axis:   81 Text Interpretation:  Sinus or ectopic atrial rhythm Prolonged PR interval  Borderline right axis deviation Low voltage, precordial leads Confirmed by  Kynli Chou  MD, Orvell Careaga ([id number]) on 05/08/2016 7:35:32  PM      MDM   Final diagnoses:  Hypoxia    Patient will pleural effusion with lymphadenopathy. She will be admitted to medicine and have further workup to determine lymphadenopathy in the pleural effusion    Milton Ferguson, MD 05/08/16 2015

## 2016-05-08 NOTE — Progress Notes (Signed)
Received call from Priscille Kluver, NP.  Pt currently at her facility and she is concerned with worsening symptoms.  Priscille Kluver, NP is wanting to admit pt; however, is requesting Dr. Geralyn Flash input as to if pt should be admitted through [facility] or to [facility] ED.  Per Lindi Adie, MD, [facility] ED is ideal location.  This information conveyed to Priscille Kluver, NP who states pt will be sent to our facility for further work up.

## 2016-05-08 NOTE — ED Notes (Signed)
CBG 108. 

## 2016-05-08 NOTE — H&P (Addendum)
History and Physical    Traci [name] VZD:[id number] DOB: [birth year] DOA: 05/08/2016  Referring MD/NP/PA:   PCP: Traci Bott, MD   Patient coming from:  The patient is coming from home.  At baseline, pt is partially dependent for her ADL.      Chief Complaint: SOB   HPI: Traci [name] is a 82 y.o. female with medical history significant of breast cancer (s/p of right polypectomy, currently on anastrozole), PAF on Coumadin, remote history of DVT, OSA (could not tolerate CPAP), SSS and s/p of pace maker placement, dCHF, GERD, hypothyroidism, anxiety, who presents with shortness of breath.  Patient reports that she has been having shortness of breath in the past several weeks, which has been gradually getting worse. She has dry cough. Patient does not have fever or chills. She has mild bilateral lower chest pain which is induced by coughing. Patient does not have runny nose, sore throat, tenderness over calf areas. Patient does not have nausea, vomiting, abdominal pain, diarrhea, symptoms of a UTI or unilateral weakness. She was seen by her PCP today, and sent to the hospital due to concerning for pneumonia.  ED Course: pt was found to have WBC 14.2, lactate 1.64-->0.95, INR 2.58, troponin negative, BNP 69.2, temperature normal, no tachycardia, oxygen saturation 92% on room air, electrolytes and renal function okay. Chest x-ray showed increased right pleural effusion. CT chest showed right pleural effusion, moderate to large in size, stable compared to earlier chest CT of 05/03/2016. The pleural effusion again appears to be at least partially loculated. The adjacent compressive atelectasis is stable in extent. No evidence of pneumonia seen; milder atelectasis at the left lung base. Left lung otherwise Clear; Extensive mediastinal lymphadenopathy; irregular nodule/mass bordering the right lateral margin of the upper mediastinum, measuring 2.2 x 1.5 cm, additional mediastinal; trace pericardial  effusion, stable. Heart size is normal; left adrenal mass, measuring 2.1 cm, adenoma versus metastasis. Pt is admitted to inpatient for further interventional treatment.  Review of Systems:   General: no fevers, chills, no changes in body weight, has fatigue HEENT: no blurry vision, hearing changes or sore throat Pulm: has dyspnea, coughing, no wheezing CV: has chest pain, no palpitations Abd: no nausea, vomiting, abdominal pain, diarrhea, constipation GU: no dysuria, burning on urination, increased urinary frequency, hematuria  Ext: no leg edema Neuro: no unilateral weakness, numbness, or tingling, no vision change or hearing loss Skin: no rash MSK: No muscle spasm, no deformity, no limitation of range of movement in spin Heme: No easy bruising.  Travel history: No recent long distant travel.  Allergy:  Allergies  Allergen Reactions  . Penicillins Swelling and Other (See Comments)    "Felt like brain was swelling out of her head"  . Codeine Other (See Comments)    Stomach issues  . Shellfish Allergy Hives and Itching  . Sulfonamide Derivatives Other (See Comments)    Immune to this    Past Medical History  Diagnosis Date  . DOE (dyspnea on exertion)   . COPD (chronic obstructive pulmonary disease) ([place])   . Anxiety   . CHF (congestive heart failure) ([place])   . Diverticulitis of colon   . Paroxysmal atrial fibrillation (HCC)   . HTN (hypertension)   . Obesity   . Hyperlipidemia   . DM (diabetes mellitus) ([place])   . Full dentures   . Wears glasses   . OSA (obstructive sleep apnea)     has a cpap-cannot use  . GERD (gastroesophageal reflux disease)   .  Arthritis   . History of DVT (deep vein thrombosis)   . Presence of permanent cardiac pacemaker   . Hypothyroidism   . Cancer [facility])     right breast cancer  . Hypokalemia 03/05/2016  . Sick sinus syndrome ([place]) 03/05/16    MDT PPM gen change Dr. Lovena Le    Past Surgical History  Procedure Laterality Date  .  Abdominal hysterectomy    . C-spine surgery  1992    cervical disk  . Lower back surgery  1980  . Esophagogastroduodenoscopy    . Dilation and curettage of uterus    . Cardiac catheterization  2005  . Pacemaker insertion  2007  . Colonoscopy    . Breast lumpectomy with radioactive seed localization Right 02/08/2015    Procedure: BREAST LUMPECTOMY WITH RADIOACTIVE SEED LOCALIZATION;  Surgeon: Traci Klein, MD;  Location: [facility];  Service: General;  Laterality: Right;  . Re-excision of breast lumpectomy Right 02/14/2015    Procedure: RE-EXCISION OF RIGHT BREAST LUMPECTOMY;  Surgeon: Traci Klein, MD;  Location: [facility];  Service: General;  Laterality: Right;  . Cholecystectomy    . Back surgery    . Ep implantable device N/A 03/05/2016    MDT PPM gen change    Social History:  reports that she quit smoking about 19 years ago. She does not have any smokeless tobacco history on file. She reports that she does not drink alcohol or use illicit drugs.  Family History:  Family History  Problem Relation Age of Onset  . Heart failure Mother   . Pancreatic cancer Father 69  . Vaginal cancer Sister 32  . Hodgkin's lymphoma Sister 70  . Lung cancer Sister 67     Prior to Admission medications   Medication Sig Start Date End Date Taking? Authorizing Provider  allopurinol (ZYLOPRIM) 300 MG tablet Take 300 mg by mouth daily.   Yes Historical Provider, MD  alum hydroxide-mag trisilicate (GAVISCON) 25-85 MG CHEW chewable tablet Chew 1 tablet by mouth 4 (four) times daily as needed for indigestion or heartburn.   Yes Historical Provider, MD  amiodarone (PACERONE) 200 MG tablet Take 1 tablet (200 mg total) by mouth daily. 11/19/13  Yes Thompson Grayer, MD  anastrozole (ARIMIDEX) 1 MG tablet Take 1 tablet (1 mg total) by mouth daily. 01/06/16  Yes Nicholas Lose, MD  bismuth subsalicylate (PEPTO BISMOL) 262 MG/15ML suspension Take 30 mLs by mouth every 6 (six) hours as  needed.   Yes Historical Provider, MD  diltiazem (CARDIZEM CD) 180 MG 24 hr capsule TAKE ONE CAPSULE BY MOUTH ONCE DAILY 01/26/15  Yes Historical Provider, MD  diphenhydramine-acetaminophen (TYLENOL PM) 25-500 MG TABS tablet Take 1 tablet by mouth at bedtime as needed.   Yes Historical Provider, MD  furosemide (LASIX) 80 MG tablet Take 80 mg by mouth daily.   Yes Historical Provider, MD  HYDROcodone-acetaminophen (NORCO) 10-325 MG per tablet Take 1 tablet by mouth every 6 (six) hours as needed for severe pain. 02/14/15  Yes Traci Klein, MD  levothyroxine (SYNTHROID, LEVOTHROID) 25 MCG tablet Take 25 mcg by mouth daily before breakfast.  01/23/15  Yes Historical Provider, MD  metoprolol (TOPROL-XL) 100 MG 24 hr tablet Take 100 mg by mouth daily.     Yes Historical Provider, MD  nitroGLYCERIN (NITROSTAT) 0.4 MG SL tablet Place 0.4 mg under the tongue every 5 (five) minutes as needed for chest pain (MAX 3 TABLETS).    Yes Historical Provider, MD  omeprazole ([place])  40 MG capsule Take 40 mg by mouth daily.   Yes Historical Provider, MD  Potassium Chloride ER 20 MEQ TBCR Take 40 mEq by mouth 2 (two) times daily. 03/06/16  Yes Shela Leff, MD  pravastatin (PRAVACHOL) 20 MG tablet Take 20 mg by mouth daily.  01/12/15  Yes Historical Provider, MD  warfarin (COUMADIN) 5 MG tablet Take 2.5-5 mg by mouth daily at 6 PM. TAKES '5MG'$  ON WED, THURS AND FRI ONLY TAKES 2.'5MG'$  ALL OTHER DAYS   Yes Historical Provider, MD    Physical Exam: Filed Vitals:   05/08/16 2015 05/08/16 2045 05/08/16 2137 05/08/16 2138  BP: 129/80 145/66  124/74  Pulse: 66 59  69  Temp:    98.1 F (36.7 C)  TempSrc:    Oral  Resp: '18 18  20  '$ Height:   '5\' 5"'$  (1.651 m) '5\' 5"'$  (1.651 m)  Weight:   107.366 kg (236 lb 11.2 oz) 107.366 kg (236 lb 11.2 oz)  SpO2: 96% 97%  96%   General: Not in acute distress HEENT:       Eyes: PERRL, EOMI, no scleral icterus.       ENT: No discharge from the ears and nose, no pharynx injection, no  tonsillar enlargement.        Neck: No JVD, no bruit, no mass felt. Heme: No neck lymph node enlargement. Cardiac: S1/S2, RRR, No murmurs, No gallops or rubs. Pulm: Decreased air movement in the right side. No rales, wheezing, rhonchi or rubs. Abd: Soft, nondistended, nontender, no rebound pain, no organomegaly, BS present. GU: No hematuria Ext: No pitting leg edema bilaterally. 2+DP/PT pulse bilaterally. Musculoskeletal: No joint deformities, No joint redness or warmth, no limitation of ROM in spin. Skin: No rashes.  Neuro: Alert, oriented X3, cranial nerves II-XII grossly intact, moves all extremities normally. Psych: Patient is not psychotic, no suicidal or hemocidal ideation.  Labs on Admission: I have personally reviewed following labs and imaging studies  CBC:  Recent Labs Lab 05/08/16 1310  WBC 14.2*  NEUTROABS 11.7*  HGB 10.2*  HCT 32.8*  MCV 88.9  PLT 220   Basic Metabolic Panel:  Recent Labs Lab 05/08/16 1310  NA 137  K 4.5  CL 102  CO2 26  GLUCOSE 118*  BUN 15  CREATININE 1.00  CALCIUM 9.3   GFR: Estimated Creatinine Clearance: 52.9 mL/min (by C-G formula based on Cr of 1). Liver Function Tests:  Recent Labs Lab 05/08/16 1310  AST 29  ALT 21  ALKPHOS 76  BILITOT 0.9  PROT 6.6  ALBUMIN 3.4*   No results for input(s): LIPASE, AMYLASE in the last 168 hours. No results for input(s): AMMONIA in the last 168 hours. Coagulation Profile:  Recent Labs Lab 05/08/16 1310  INR 2.58*   Cardiac Enzymes: No results for input(s): CKTOTAL, CKMB, CKMBINDEX, TROPONINI in the last 168 hours. BNP (last 3 results) No results for input(s): PROBNP in the last 8760 hours. HbA1C: No results for input(s): HGBA1C in the last 72 hours. CBG:  Recent Labs Lab 05/08/16 1935  GLUCAP 103*   Lipid Profile: No results for input(s): CHOL, HDL, LDLCALC, TRIG, CHOLHDL, LDLDIRECT in the last 72 hours. Thyroid Function Tests: No results for input(s): TSH, T4TOTAL,  FREET4, T3FREE, THYROIDAB in the last 72 hours. Anemia Panel: No results for input(s): VITAMINB12, FOLATE, FERRITIN, TIBC, IRON, RETICCTPCT in the last 72 hours. Urine analysis: No results found for: COLORURINE, APPEARANCEUR, LABSPEC, PHURINE, GLUCOSEU, HGBUR, BILIRUBINUR, KETONESUR, PROTEINUR, UROBILINOGEN, NITRITE, LEUKOCYTESUR Sepsis  Labs: '@LABRCNTIP'$ (procalcitonin:4,lacticidven:4) )No results found for this or any previous visit (from the past 240 hour(s)).   Radiological Exams on Admission: Dg Chest 2 View  05/08/2016  CLINICAL DATA:  Shortness of breath for 2 weeks EXAM: CHEST  2 VIEW COMPARISON:  05/03/2016 FINDINGS: Cardiac shadow is mildly enlarged. The left lung remains clear. An enlarging right-sided pleural effusion is noted with underlying atelectasis. No bony abnormality is noted. A pacing device is again seen. IMPRESSION: Enlarging right-sided pleural effusion Electronically Signed   By: Inez Catalina M.D.   On: 05/08/2016 14:20   Ct Chest W Contrast  05/08/2016  CLINICAL DATA:  Increasing pneumonia, shortness of breath and productive cough. EXAM: CT CHEST WITH CONTRAST TECHNIQUE: Multidetector CT imaging of the chest was performed during intravenous contrast administration. CONTRAST:  62m ISOVUE-300 IOPAMIDOL (ISOVUE-300) INJECTION 61% COMPARISON:  Chest CT dated 05/03/2016. FINDINGS: Mediastinum/Lymph Nodes: Again noted is the multiple large lymph nodes within the mediastinum. Largest lymph node is again seen within the right lower peritracheal space measuring 3.3 cm short axis dimension. Additional confluent lymphadenopathy is again seen within the sub- carinal space measuring approximately 3 cm short axis dimension. An additional abnormal lymph node in the prevascular space measures 1.3 cm short axis dimension. Several other smaller lymph nodes are noted. There is an irregular mass along the right lateral margin of the upper mediastinum, measuring 2.2 x 1.5 cm (series 201, image 16)  which could be either a mediastinal mass or parenchymal mass. Heart size is normal. Trace pericardial effusion again appreciated. Scattered atherosclerotic changes are seen along the walls of the normal-caliber thoracic aorta. No aortic aneurysm or dissection. Both thyroid lobes appear enlarged and slightly heterogeneous. A 1.2 cm nodule in the left thyroid lobe was better seen on the earlier chest CT. Lungs/Pleura: The moderate-to-large right pleural effusion appears stable in size and extent, at least partially loculated, with adjacent compressive atelectasis which also appears stable in extent. No new lung findings. Milder atelectasis noted at the left lung base. Small emphysematous blebs noted at each lung apex. Upper abdomen: Limited images of the upper abdomen are unremarkable. Again noted is a left renal cyst, incompletely imaged. Again noted is the nodule within the left adrenal gland measuring 2.1 x 1.3 cm. Musculoskeletal: Degenerative changes are seen throughout the thoracolumbar spine. No acute or suspicious osseous lesion. Superficial soft tissues are unremarkable. IMPRESSION: 1. Right pleural effusion, moderate to large in size, stable compared to earlier chest CT of 05/03/2016. The pleural effusion again appears to be at least partially loculated. The adjacent compressive atelectasis is stable in extent. No evidence of pneumonia seen. 2. Milder atelectasis at the left lung base. Left lung otherwise clear. 3. Extensive mediastinal lymphadenopathy. As described on the earlier CT report, the large size of the mediastinal lymph nodes suggests metastatic disease or lymphoma. 4. Irregular nodule/mass bordering the right lateral margin of the upper mediastinum, measuring 2.2 x 1.5 cm, additional mediastinal mass versus neoplastic pulmonary mass. 5. Trace pericardial effusion, stable.  Heart size is normal. 6. Heterogeneously enlarged thyroid. 7. Left adrenal mass, measuring 2.1 cm, adenoma versus metastasis.  Electronically Signed   By: SFranki CabotM.D.   On: 05/08/2016 18:57     EKG: Independently reviewed.  Not done in ED, will get one.   Assessment/Plan Principal Problem:   Pleural effusion, right Active Problems:   DM (diabetes mellitus) type II controlled with renal manifestation (HCC)   Hyperlipemia   Obstructive sleep apnea   Essential hypertension  PAROXYSMAL ATRIAL FIBRILLATION   COPD (chronic obstructive pulmonary disease) (HCC)   PPM-[facility]link   Breast cancer of upper-outer quadrant of right female breast (HCC)   Chronic diastolic CHF (congestive heart failure) (HCC)   Shortness of breath   Acute on chronic respiratory failure with hypoxia (HCC)   Leukocytosis   Hypoxia   GERD (gastroesophageal reflux disease)  Acute on chronic respiratory failure with hypoxia due to right pleural effusion: Patient's shortness of breath is most likely due to pleural effusion. She does not have fever, clinically not seem to have pneumonia. Patient is on Coumadin for atrial fibrillation with therapeutic INR, unlikely to have pulmonary embolism. CT-chest findings are very concerning for metastasized disease, which may have also contributed to her shortness of breath. Patient needs therapeutic and diagnostic thoracentesis.  -will admit to tele bed as in pt -will reverse INR, give 5 mg of IV vitamin K-->check INR daily. -will need to consult to IR for thoracentesis if INR is normalized tomorrow -Mucinex for cough -Naso cannula oxygen -When necessary Norco for pain  Addendum: INR in AM is 1.98-->will give another dose of Vk 5 mg x 1 and repeat INR at 13:00 PM.  Hx of Breast Cancer: Right breast low-grade DCIS 8 mm in size. S/p of R lumpectomy with focal inferior margin positive requiring reexcision 02/15/2015 ER 100% PR 100% positive. She is on Anastrozole. She was followed up by Dr. Lindi Adie. CT-chest findings are very concerning for metastasized to disease. -will continue Anastrozole -May call  Dr. Lindi Adie in AM or f/u with Dr. Lindi Adie at early time after discharge  Paroxsymal Atrial Fibrillation: CHAD-VASc score 6. She is taking coumadin with INR 2.58 on admission. HR is well controled.  - will hold coumadin for thoracentesis - Reverse INR as above - Continue Amiodarone, Diltiazem, Metoprolol  HTN: Bp 120/49 on admission -on Diltiazem and Metoprolol. -also on lasix for CHF  Chronic diastolic congestive heart failure: 2-D echo on 03/03/16 showed EF 60%. BNP 69.2. Patient does not have leg edema or JVD. CHF is compensated. -Continue metoprolol and home dose of Lasix, 80 mg daily  Hypothyroidism: Last TSH was 4.110 on 03/02/16 -Continue home Synthroid  OSA: Patient reports she cannot tolerate CPAP and does not use it at home.   Diet controlled DM-II: Last A1c not on record. Patient is not taking medications at home. Blood sugar 180 on admission -SSI -Check A1c  HLD: Last LDL was not on record -Continue home medications:  -Check FLP  Leukocytosis: Patient does not have other signs of infection, likely due to demargination. -will hold off Abx -will get blood culture if develops fever -f/u by CBC  COPD: stable. No wheezing or rhonchi on auscultation. -When necessary albuterol for wheezing  GERD: -Protonix   DVT ppx: did not start any DVT PPx since her INR=2.58 today. Please start SQ Heparin when INR is normalized tomorrow. Code Status: DNR (I spent lengthy time with pt to have discussed about CODE STATUS, she and her daughter want DO NOT RESUSCITATE) Family Communication:  Yes, patient's daughter at bed side Disposition Plan:  Anticipate discharge back to previous home environment Consults called:  none Admission status: Inpatient/tele  Date of Service 05/08/2016    Ivor Costa [facility] Pager [phone]  If 7PM-7AM, please contact night-coverage [email or web address] Password [email or web address] 05/08/2016, 10:04 PM

## 2016-05-08 NOTE — ED Notes (Signed)
Pt sent here from PCP for eval of worsening PNA; pt sts fluid in right lung and SOB; pt sts cough

## 2016-05-09 DIAGNOSIS — R0602 Shortness of breath: Secondary | ICD-10-CM

## 2016-05-09 DIAGNOSIS — R590 Localized enlarged lymph nodes: Secondary | ICD-10-CM

## 2016-05-09 DIAGNOSIS — I10 Essential (primary) hypertension: Secondary | ICD-10-CM

## 2016-05-09 DIAGNOSIS — J90 Pleural effusion, not elsewhere classified: Principal | ICD-10-CM

## 2016-05-09 DIAGNOSIS — C50411 Malignant neoplasm of upper-outer quadrant of right female breast: Secondary | ICD-10-CM

## 2016-05-09 DIAGNOSIS — J948 Other specified pleural conditions: Secondary | ICD-10-CM

## 2016-05-09 DIAGNOSIS — D0511 Intraductal carcinoma in situ of right breast: Secondary | ICD-10-CM

## 2016-05-09 DIAGNOSIS — J9621 Acute and chronic respiratory failure with hypoxia: Secondary | ICD-10-CM

## 2016-05-09 LAB — BASIC METABOLIC PANEL
Anion gap: 7 (ref 5–15)
BUN: 14 mg/dL (ref 6–20)
CHLORIDE: 103 mmol/L (ref 101–111)
CO2: 26 mmol/L (ref 22–32)
Calcium: 9.2 mg/dL (ref 8.9–10.3)
Creatinine, Ser: 0.8 mg/dL (ref 0.44–1.00)
GFR calc Af Amer: >60 mL/min (ref >60)
GLUCOSE: 109 mg/dL — AB (ref 65–99)
POTASSIUM: 4.4 mmol/L (ref 3.5–5.1)
Sodium: 136 mmol/L (ref 135–145)

## 2016-05-09 LAB — GLUCOSE, CAPILLARY
GLUCOSE-CAPILLARY: 111 mg/dL — AB (ref 65–99)
GLUCOSE-CAPILLARY: 120 mg/dL — AB (ref 65–99)
Glucose-Capillary: 95 mg/dL (ref 65–99)
Glucose-Capillary: 97 mg/dL (ref 65–99)

## 2016-05-09 LAB — CBC
HEMATOCRIT: 32 % — AB (ref 36.0–46.0)
Hemoglobin: 9.9 g/dL — ABNORMAL LOW (ref 12.0–15.0)
MCH: 27 pg (ref 26.0–34.0)
MCHC: 30.9 g/dL (ref 30.0–36.0)
MCV: 87.4 fL (ref 78.0–100.0)
Platelets: 289 10*3/uL (ref 150–400)
RBC: 3.66 MIL/uL — ABNORMAL LOW (ref 3.87–5.11)
RDW: 14.5 % (ref 11.5–15.5)
WBC: 14.3 10*3/uL — ABNORMAL HIGH (ref 4.0–10.5)

## 2016-05-09 LAB — LIPID PANEL
Cholesterol: 103 mg/dL (ref 0–200)
HDL: 44 mg/dL (ref >40)
LDL CALC: 45 mg/dL (ref 0–99)
TRIGLYCERIDES: 71 mg/dL (ref <150)
Total CHOL/HDL Ratio: 2.3 RATIO
VLDL: 14 mg/dL (ref 0–40)

## 2016-05-09 LAB — PROTIME-INR
INR: 1.98 — ABNORMAL HIGH (ref 0.00–1.49)
INR: 1.58 — ABNORMAL HIGH (ref 0.00–1.49)
PROTHROMBIN TIME: 22.4 s — AB (ref 11.6–15.2)
PROTHROMBIN TIME: 18.9 s — AB (ref 11.6–15.2)

## 2016-05-09 MED ORDER — TEMAZEPAM 7.5 MG PO CAPS
7.5000 mg | ORAL_CAPSULE | Freq: Every evening | ORAL | Status: DC | PRN
  Administered 2016-05-09: 7.5 mg via ORAL
  Filled 2016-05-09: qty 1

## 2016-05-09 MED ORDER — VITAMIN K1 10 MG/ML IJ SOLN
5.0000 mg | Freq: Once | INTRAVENOUS | Status: AC
  Administered 2016-05-09: 5 mg via INTRAVENOUS
  Filled 2016-05-09: qty 0.5

## 2016-05-09 NOTE — Care Management Important Message (Signed)
Important Message  Patient Details  Name: Traci [name] MRN: [id number] Date of Birth: November 17, 1932   Medicare Important Message Given:  Yes    [name] 05/09/2016, 9:19 AM

## 2016-05-09 NOTE — Evaluation (Addendum)
Physical Therapy Evaluation Patient Details Name: Traci [name] MRN: [id number] DOB: [birth year] Today's Date: 05/09/2016   History of Present Illness  82 y.o. female with medical history significant of breast cancer (s/p of right polypectomy, currently on anastrozole), PAF on Coumadin, remote history of DVT, OSA (could not tolerate CPAP), SSS and s/p of pace maker placement, dCHF, GERD, hypothyroidism, anxiety, who presents with shortness of breath. CT shows 2.1 cm mass - adenoma vs. metastasis  Clinical Impression  Pt admitted with above diagnosis. Pt currently with functional limitations due to the deficits listed below (see PT Problem List).  Pt will benefit from skilled PT to increase their independence and safety with mobility to allow discharge to the venue listed below.  Pt limited by SOB, but moves well overall.  O2 94-97% throughout gait on 2 L/min.  Will continue to work with patient and see how she progresses to determine if she needs HHPT or not.     Follow Up Recommendations Home health PT;Other (comment) (depending on progress during acute stay)    Equipment Recommendations  None recommended by PT    Recommendations for Other Services       Precautions / Restrictions        Mobility  Bed Mobility               General bed mobility comments: up in recliner- states she slept there due to breathing  Transfers Overall transfer level: Needs assistance Equipment used: Rolling walker (2 wheeled) Transfers: Sit to/from Stand Sit to Stand: Min guard         General transfer comment: cues for hand placement  Ambulation/Gait Ambulation/Gait assistance: Min guard;Supervision Ambulation Distance (Feet): 15 Feet (plus 30' and 15') Assistive device: Rolling walker (2 wheeled) Gait Pattern/deviations: Step-through pattern     General Gait Details: Amb on 2 L with 2/4 dyspnea after walking 30'.  Pt only wanted to ambulate in room due to feeling she would not be able  to make it if she walked in hallway.  Stairs            Wheelchair Mobility    Modified Rankin (Stroke Patients Only)       Balance Overall balance assessment: Needs assistance   Sitting balance-Leahy Scale: Good       Standing balance-Leahy Scale: Fair                               Pertinent Vitals/Pain Pain Assessment: No/denies pain    Home Living Family/patient expects to be discharged to:: Private residence Living Arrangements: Spouse/significant other Available Help at Discharge: Family;Available 24 hours/day Type of Home: House Home Access: Stairs to enter Entrance Stairs-Rails: Left;Right;Can reach both Entrance Stairs-Number of Steps: 5 Home Layout: One level Home Equipment: Tub bench;Grab bars - toilet;Cane - single point;Other (comment);Walker - 2 wheels;Walker - standard;Bedside commode (thinks they have RW and SW in a shed)      Prior Function Level of Independence: Independent with assistive device(s)         Comments: Used cane just outside until recently has started to use it indoors.     Hand Dominance   Dominant Hand: Right    Extremity/Trunk Assessment               Lower Extremity Assessment: Overall WFL for tasks assessed      Cervical / Trunk Assessment: Normal  Communication   Communication: No difficulties  Cognition  Arousal/Alertness: Awake/alert Behavior During Therapy: WFL for tasks assessed/performed Overall Cognitive Status: Within Functional Limits for tasks assessed                      General Comments General comments (skin integrity, edema, etc.): Pt slightly anxious about walking farther due to SOB    Exercises        Assessment/Plan    PT Assessment Patient needs continued PT services  PT Diagnosis Difficulty walking   PT Problem List Decreased strength;Decreased activity tolerance;Decreased balance;Cardiopulmonary status limiting activity;Decreased mobility  PT Treatment  Interventions Gait training;Functional mobility training;Therapeutic activities;Therapeutic exercise;Balance training;DME instruction   PT Goals (Current goals can be found in the Care Plan section) Acute Rehab PT Goals Patient Stated Goal: to go home without oxygen PT Goal Formulation: With patient Time For Goal Achievement: 05/23/16 Potential to Achieve Goals: Good    Frequency Min 3X/week   Barriers to discharge        Co-evaluation               End of Session Equipment Utilized During Treatment: Gait belt Activity Tolerance: Patient limited by fatigue Patient left: in chair;with call bell/phone within reach;Other (comment) (with hospitalist)           Time: 7366-8159 PT Time Calculation (min) (ACUTE ONLY): 23 min   Charges:   PT Evaluation $PT Eval Low Complexity: 1 Procedure PT Treatments $Gait Training: 8-22 mins   PT G Codes:        Pennye Beeghly LUBECK 05/09/2016, 10:14 AM

## 2016-05-09 NOTE — Progress Notes (Signed)
PROGRESS NOTE    Traci [name]  ZTI:[id number] DOB: June 11, 1932 DOA: 05/08/2016 PCP: Raelene Bott, MD   Outpatient Specialists:     Brief Narrative:  Traci [name] is a 82 y.o. female with medical history significant of breast cancer (s/p of right polypectomy, currently on anastrozole), PAF on Coumadin, remote history of DVT, OSA (could not tolerate CPAP), SSS and s/p of pace maker placement, dCHF, GERD, hypothyroidism, anxiety, who presents with shortness of breath.  Patient reports that she has been having shortness of breath in the past several weeks, which has been gradually getting worse. She has dry cough. Patient does not have fever or chills. She has mild bilateral lower chest pain which is induced by coughing. Patient does not have runny nose, sore throat, tenderness over calf areas. Patient does not have nausea, vomiting, abdominal pain, diarrhea, symptoms of a UTI or unilateral weakness. She was seen by her PCP today, and sent to the hospital due to concerning for pneumonia.   Assessment & Plan:   Principal Problem:   Pleural effusion, right Active Problems:   DM (diabetes mellitus) type II controlled with renal manifestation (HCC)   Hyperlipemia   Obstructive sleep apnea   Essential hypertension   PAROXYSMAL ATRIAL FIBRILLATION   COPD (chronic obstructive pulmonary disease) (HCC)   PPM-[facility]   Breast cancer of upper-outer quadrant of right female breast (HCC)   Chronic diastolic CHF (congestive heart failure) (HCC)   Shortness of breath   Acute on chronic respiratory failure with hypoxia (HCC)   Leukocytosis   Hypoxia   GERD (gastroesophageal reflux disease)   Acute on chronic respiratory failure with hypoxia due to right pleural effusion: Patient's shortness of breath is most likely due to pleural effusion. She does not have fever, clinically not seem to have pneumonia. Patient is on Coumadin for atrial fibrillation with therapeutic INR, unlikely to have pulmonary  embolism. CT-chest findings are very concerning for metastasized disease, which may have also contributed to her shortness of breath. Patient needs therapeutic and diagnostic thoracentesis. -will reverse INR- hope for thoracentesis in AM- will get labs and culture -Mucinex for cough -Nasal cannula oxygen -When necessary Norco for pain   Hx of Breast Cancer: Right breast low-grade DCIS 8 mm in size. S/p of R lumpectomy with focal inferior margin positive requiring reexcision 02/15/2015 ER 100% PR 100% positive. She is on Anastrozole. She was followed up by Dr. Lindi Adie. CT-chest findings are very concerning for metastasized to disease-- suspect secondary process -will continue Anastrozole   Paroxsymal Atrial Fibrillation: CHAD-VASc score 6. She is taking coumadin with INR 2.58 on admission. HR is well controled.  - will hold coumadin for thoracentesis - Reverse INR as above - Continue Amiodarone, Diltiazem, Metoprolol  HTN: Bp 120/49 on admission -on Diltiazem and Metoprolol. -also on lasix for CHF  Chronic diastolic congestive heart failure: 2-D echo on 03/03/16 showed EF 60%. BNP 69.2. Patient does not have leg edema or JVD. CHF is compensated. -Continue metoprolol and home dose of Lasix, 80 mg daily  Hypothyroidism: Last TSH was 4.110 on 03/02/16 -Continue home Synthroid  OSA: Patient reports she cannot tolerate CPAP and does not use it at home.   Diet controlled DM-II: Last A1c not on record. Patient is not taking medications at home. Blood sugar 180 on admission -SSI -Check A1c  HLD: Last LDL was not on record -Continue home medications:  -Check FLP  Leukocytosis: Patient does not have other signs of infection, likely due to demargination. -will hold  off Abx -will get blood culture if develops fever -f/u by CBC  COPD: stable. No wheezing or rhonchi on auscultation. -When necessary albuterol for wheezing  GERD: -Protonix   DVT prophylaxis:  SCD's  Code  Status: DNR   Family Communication:   Disposition Plan:     Consultants:   Dr. Lindi Adie  Procedures:   Thoracentesis in AM     Subjective: Winded with activity  Objective: Filed Vitals:   05/08/16 2137 05/08/16 2138 05/09/16 0017 05/09/16 0428  BP:  124/74 131/51 136/61  Pulse:  69  65  Temp:  98.1 F (36.7 C)  98.3 F (36.8 C)  TempSrc:  Oral  Oral  Resp:  20  18  Height: '5\' 5"'$  (1.651 m) '5\' 5"'$  (1.651 m)    Weight: 107.366 kg (236 lb 11.2 oz) 107.366 kg (236 lb 11.2 oz)  106.777 kg (235 lb 6.4 oz)  SpO2:  96%  97%    Intake/Output Summary (Last 24 hours) at 05/09/16 1234 Last data filed at 05/09/16 0935  Gross per 24 hour  Intake      0 ml  Output    600 ml  Net   -600 ml   Filed Weights   05/08/16 2137 05/08/16 2138 05/09/16 0428  Weight: 107.366 kg (236 lb 11.2 oz) 107.366 kg (236 lb 11.2 oz) 106.777 kg (235 lb 6.4 oz)    Examination:  General exam: Appears calm and comfortable  Respiratory system: Clear to auscultation. Respiratory effort normal. Cardiovascular system: S1 & S2 heard, RRR. No JVD, murmurs, rubs, gallops or clicks. No pedal edema. Gastrointestinal system: Abdomen is nondistended, soft and nontender. No organomegaly or masses felt. Normal bowel sounds heard. Central nervous system: Alert and oriented. No focal neurological deficits. Extremities: Symmetric 5 x 5 power. Skin: No rashes, lesions or ulcers Psychiatry: Judgement and insight appear normal. Mood & affect appropriate.     Data Reviewed: I have personally reviewed following labs and imaging studies  CBC:  Recent Labs Lab 05/08/16 1310 05/09/16 0413  WBC 14.2* 14.3*  NEUTROABS 11.7*  --   HGB 10.2* 9.9*  HCT 32.8* 32.0*  MCV 88.9 87.4  PLT 329 353   Basic Metabolic Panel:  Recent Labs Lab 05/08/16 1310 05/09/16 0413  NA 137 136  K 4.5 4.4  CL 102 103  CO2 26 26  GLUCOSE 118* 109*  BUN 15 14  CREATININE 1.00 0.80  CALCIUM 9.3 9.2   GFR: Estimated  Creatinine Clearance: 65.8 mL/min (by C-G formula based on Cr of 0.8). Liver Function Tests:  Recent Labs Lab 05/08/16 1310  AST 29  ALT 21  ALKPHOS 76  BILITOT 0.9  PROT 6.6  ALBUMIN 3.4*   No results for input(s): LIPASE, AMYLASE in the last 168 hours. No results for input(s): AMMONIA in the last 168 hours. Coagulation Profile:  Recent Labs Lab 05/08/16 1310 05/09/16 0413  INR 2.58* 1.98*   Cardiac Enzymes:  Recent Labs Lab 05/08/16 2243  TROPONINI <0.03   BNP (last 3 results) No results for input(s): PROBNP in the last 8760 hours. HbA1C: No results for input(s): HGBA1C in the last 72 hours. CBG:  Recent Labs Lab 05/08/16 1935 05/08/16 2144 05/09/16 0626 05/09/16 1151  GLUCAP 103* 115* 111* 120*   Lipid Profile:  Recent Labs  05/09/16 0413  CHOL 103  HDL 44  LDLCALC 45  TRIG 71  CHOLHDL 2.3   Thyroid Function Tests: No results for input(s): TSH, T4TOTAL, FREET4, T3FREE, THYROIDAB in the  last 72 hours. Anemia Panel: No results for input(s): VITAMINB12, FOLATE, FERRITIN, TIBC, IRON, RETICCTPCT in the last 72 hours. Urine analysis: No results found for: COLORURINE, APPEARANCEUR, LABSPEC, [place], GLUCOSEU, HGBUR, BILIRUBINUR, KETONESUR, PROTEINUR, UROBILINOGEN, NITRITE, LEUKOCYTESUR   )No results found for this or any previous visit (from the past 240 hour(s)).    Anti-infectives    None       Radiology Studies: Dg Chest 2 View  05/08/2016  CLINICAL DATA:  Shortness of breath for 2 weeks EXAM: CHEST  2 VIEW COMPARISON:  05/03/2016 FINDINGS: Cardiac shadow is mildly enlarged. The left lung remains clear. An enlarging right-sided pleural effusion is noted with underlying atelectasis. No bony abnormality is noted. A pacing device is again seen. IMPRESSION: Enlarging right-sided pleural effusion Electronically Signed   By: Inez Catalina M.D.   On: 05/08/2016 14:20   Ct Chest W Contrast  05/08/2016  CLINICAL DATA:  Increasing pneumonia, shortness  of breath and productive cough. EXAM: CT CHEST WITH CONTRAST TECHNIQUE: Multidetector CT imaging of the chest was performed during intravenous contrast administration. CONTRAST:  52m ISOVUE-300 IOPAMIDOL (ISOVUE-300) INJECTION 61% COMPARISON:  Chest CT dated 05/03/2016. FINDINGS: Mediastinum/Lymph Nodes: Again noted is the multiple large lymph nodes within the mediastinum. Largest lymph node is again seen within the right lower peritracheal space measuring 3.3 cm short axis dimension. Additional confluent lymphadenopathy is again seen within the sub- carinal space measuring approximately 3 cm short axis dimension. An additional abnormal lymph node in the prevascular space measures 1.3 cm short axis dimension. Several other smaller lymph nodes are noted. There is an irregular mass along the right lateral margin of the upper mediastinum, measuring 2.2 x 1.5 cm (series 201, image 16) which could be either a mediastinal mass or parenchymal mass. Heart size is normal. Trace pericardial effusion again appreciated. Scattered atherosclerotic changes are seen along the walls of the normal-caliber thoracic aorta. No aortic aneurysm or dissection. Both thyroid lobes appear enlarged and slightly heterogeneous. A 1.2 cm nodule in the left thyroid lobe was better seen on the earlier chest CT. Lungs/Pleura: The moderate-to-large right pleural effusion appears stable in size and extent, at least partially loculated, with adjacent compressive atelectasis which also appears stable in extent. No new lung findings. Milder atelectasis noted at the left lung base. Small emphysematous blebs noted at each lung apex. Upper abdomen: Limited images of the upper abdomen are unremarkable. Again noted is a left renal cyst, incompletely imaged. Again noted is the nodule within the left adrenal gland measuring 2.1 x 1.3 cm. Musculoskeletal: Degenerative changes are seen throughout the thoracolumbar spine. No acute or suspicious osseous lesion.  Superficial soft tissues are unremarkable. IMPRESSION: 1. Right pleural effusion, moderate to large in size, stable compared to earlier chest CT of 05/03/2016. The pleural effusion again appears to be at least partially loculated. The adjacent compressive atelectasis is stable in extent. No evidence of pneumonia seen. 2. Milder atelectasis at the left lung base. Left lung otherwise clear. 3. Extensive mediastinal lymphadenopathy. As described on the earlier CT report, the large size of the mediastinal lymph nodes suggests metastatic disease or lymphoma. 4. Irregular nodule/mass bordering the right lateral margin of the upper mediastinum, measuring 2.2 x 1.5 cm, additional mediastinal mass versus neoplastic pulmonary mass. 5. Trace pericardial effusion, stable.  Heart size is normal. 6. Heterogeneously enlarged thyroid. 7. Left adrenal mass, measuring 2.1 cm, adenoma versus metastasis. Electronically Signed   By: SFranki CabotM.D.   On: 05/08/2016 18:57  Scheduled Meds: . allopurinol  300 mg Oral Daily  . amiodarone  200 mg Oral Daily  . anastrozole  1 mg Oral Daily  . diltiazem  180 mg Oral Daily  . doxazosin  2 mg Oral Daily  . furosemide  80 mg Oral Daily  . guaiFENesin  600 mg Oral BID  . insulin aspart  0-5 Units Subcutaneous QHS  . insulin aspart  0-9 Units Subcutaneous TID WC  . levothyroxine  25 mcg Oral QAC breakfast  . metoprolol succinate  100 mg Oral Daily  . pantoprazole  40 mg Oral Daily  . pravastatin  20 mg Oral q1800  . sodium chloride flush  3 mL Intravenous Q12H   Continuous Infusions:    LOS: 1 day    Time spent: 35 min    Keith, DO [facility] Pager [phone]  If 7PM-7AM, please contact night-coverage [email or web address] Password [facility] 05/09/2016, 12:34 PM

## 2016-05-09 NOTE — Progress Notes (Signed)
HEMATOLOGY-ONCOLOGY PROGRESS NOTE  SUBJECTIVE: 82 year old with personal history of right breast DCIS treated with lumpectomy followed by adjuvant anastrozole. She's been on anastrozole since March 2016. She had been tolerating it fairly well. She presented to the hospital because of two-week history of shortness of breath. She was treated with oral antibiotics outpatient. She subsequently underwent a CT of the chest that revealed large pleural effusion and mediastinal lymphadenopathy. She was subsequently transferred to [place] for further evaluation. She had a second CT scan of the chest on 05/08/2016 which revealed large right pleural effusion partially loculated, extensive mediastinal lymphadenopathy, possibility of an additional mediastinal mass measuring 2.2 x 1.5 cm and the left adrenal mass measuring 2.1 cm. We are consulted to assist with management of malignancy.  Patient denies any fevers chills night sweats or weight loss.   Breast cancer of upper-outer quadrant of right female breast ([place])   01/18/2015 Mammogram Right breast: 8 mm mass at 8:00 position   01/18/2015 Initial Biopsy Right breast needle core biopsy: DCIS with calcifications, low-grade, ER 100%, PR 100%   02/08/2015 Surgery Right breast lumpectomy Barry Dienes): Grade 2 DCIS, spanning 1 cm. Positive inferior margin.    02/08/2015 Pathologic Stage pTis, pNx: Stage 0   02/14/2015 Surgery Right breat re-excision Barry Dienes): Microscopic focus of DCIS. Negative margins.     Miscellaneous Discussion at breast tumor board conference was that patient would not be a candidate for adjuvant RT given her advanced age.    02/2015 -  Anti-estrogen oral therapy Anastrazole daily (due to h/o blood clots). Planned duration of treatment: 5 years. Lindi Adie)   04/29/2015 Survivorship Survivorship Care Plan mailed to pt per her request. She declined in-person visit.     OBJECTIVE: REVIEW OF SYSTEMS:   Constitutional: Denies fevers, chills or abnormal weight  loss Eyes: Denies blurriness of vision Ears, nose, mouth, throat, and face: Denies mucositis or sore throat Respiratory: Shortness of breath at rest Cardiovascular: Denies palpitation, chest discomfort Gastrointestinal:  Denies nausea, heartburn or change in bowel habits Skin: Denies abnormal skin rashes Lymphatics: Denies new lymphadenopathy or easy bruising Neurological:Denies numbness, tingling or new weaknesses Behavioral/Psych: Mood is stable, no new changes  Extremities: No lower extremity edema All other systems were reviewed with the patient and are negative.  I have reviewed the past medical history, past surgical history, social history and family history with the patient and they are unchanged from previous note.   PHYSICAL EXAMINATION: ECOG PERFORMANCE STATUS: 2 - Symptomatic, <50% confined to bed  Filed Vitals:   05/09/16 0428 05/09/16 1555  BP: 136/61 119/52  Pulse: 65 63  Temp: 98.3 F (36.8 C) 98.5 F (36.9 C)  Resp: 18 18   Filed Weights   05/08/16 2137 05/08/16 2138 05/09/16 0428  Weight: 236 lb 11.2 oz (107.366 kg) 236 lb 11.2 oz (107.366 kg) 235 lb 6.4 oz (106.777 kg)    GENERAL:alert, no distress and comfortable SKIN: skin color, texture, turgor are normal, no rashes or significant lesions EYES: normal, Conjunctiva are pink and non-injected, sclera clear OROPHARYNX:no exudate, no erythema and lips, buccal mucosa, and tongue normal  NECK: supple, thyroid normal size, non-tender, without nodularity LYMPH:  no palpable lymphadenopathy in the cervical, axillary or inguinal LUNGS: Diminished breath sounds in the right lung with egophony HEART: regular rate & rhythm and no murmurs and no lower extremity edema ABDOMEN:abdomen soft, non-tender and normal bowel sounds Musculoskeletal:no cyanosis of digits and no clubbing  NEURO: alert & oriented x 3 with fluent speech, no focal motor/sensory  deficits  LABORATORY DATA:  I have reviewed the data as listed CMP  Latest Ref Rng 05/09/2016 05/08/2016 03/06/2016  Glucose 65 - 99 mg/dL 109(H) 118(H) 90  BUN 6 - 20 mg/dL 14 15 29(H)  Creatinine 0.44 - 1.00 mg/dL 0.80 1.00 1.03(H)  Sodium 135 - 145 mmol/L 136 137 140  Potassium 3.5 - 5.1 mmol/L 4.4 4.5 3.2(L)  Chloride 101 - 111 mmol/L 103 102 103  CO2 22 - 32 mmol/L '26 26 27  '$ Calcium 8.9 - 10.3 mg/dL 9.2 9.3 8.9  Total Protein 6.5 - 8.1 g/dL - 6.6 -  Total Bilirubin 0.3 - 1.2 mg/dL - 0.9 -  Alkaline Phos 38 - 126 U/L - 76 -  AST 15 - 41 U/L - 29 -  ALT 14 - 54 U/L - 21 -    Lab Results  Component Value Date   WBC 14.3* 05/09/2016   HGB 9.9* 05/09/2016   HCT 32.0* 05/09/2016   MCV 87.4 05/09/2016   PLT 289 05/09/2016   NEUTROABS 11.7* 05/08/2016    ASSESSMENT AND PLAN: 1. Large right pleural effusion with mediastinal lymphadenopathy 3.3 cm, subcarinal lymph node 3 cm prevascular node 1.3 cm in addition to a pulmonary mass measuring 2.2 x 1.5 cm.  Differential diagnosis is between lung cancer versus lymphoma Patient is scheduled to undergo a thoracentesis tomorrow. We would like the specimen to be sent for cytology but also flow cytometry. If it is lung adenocarcinoma, he may have to do molecular testing.  2. Paroxysmal atrial fibrillation with chronic diastolic congestive heart failure  However patient is not a candidate for any aggressive treatment. She is not a candidate for chemotherapy. We will have to explore more manageable and oral options for her.  Once she undergoes thoracentesis, patient may be discharged home if she is breathing better. I will plan on seeing her next week to discuss the pathology report and to discuss treatment options.  Thank you very much

## 2016-05-10 ENCOUNTER — Inpatient Hospital Stay (HOSPITAL_COMMUNITY): Payer: Medicare Other

## 2016-05-10 DIAGNOSIS — E1121 Type 2 diabetes mellitus with diabetic nephropathy: Secondary | ICD-10-CM

## 2016-05-10 LAB — PROTEIN, BODY FLUID: Total protein, fluid: 4.4 g/dL

## 2016-05-10 LAB — BODY FLUID CELL COUNT WITH DIFFERENTIAL
EOS FL: 2 %
LYMPHS FL: 4 %
MONOCYTE-MACROPHAGE-SEROUS FLUID: 26 % — AB (ref 50–90)
NEUTROPHIL FLUID: 68 % — AB (ref 0–25)
WBC FLUID: 2111 uL — AB (ref 0–1000)

## 2016-05-10 LAB — CBC
HCT: 29.9 % — ABNORMAL LOW (ref 36.0–46.0)
HEMOGLOBIN: 9.1 g/dL — AB (ref 12.0–15.0)
MCH: 26.8 pg (ref 26.0–34.0)
MCHC: 30.4 g/dL (ref 30.0–36.0)
MCV: 87.9 fL (ref 78.0–100.0)
Platelets: 305 10*3/uL (ref 150–400)
RBC: 3.4 MIL/uL — AB (ref 3.87–5.11)
RDW: 14.9 % (ref 11.5–15.5)
WBC: 14.2 10*3/uL — ABNORMAL HIGH (ref 4.0–10.5)

## 2016-05-10 LAB — GLUCOSE, CAPILLARY
GLUCOSE-CAPILLARY: 102 mg/dL — AB (ref 65–99)
GLUCOSE-CAPILLARY: 110 mg/dL — AB (ref 65–99)
GLUCOSE-CAPILLARY: 120 mg/dL — AB (ref 65–99)
Glucose-Capillary: 116 mg/dL — ABNORMAL HIGH (ref 65–99)

## 2016-05-10 LAB — GRAM STAIN

## 2016-05-10 LAB — LACTATE DEHYDROGENASE, PLEURAL OR PERITONEAL FLUID: LD FL: 193 U/L — AB (ref 3–23)

## 2016-05-10 LAB — BASIC METABOLIC PANEL
Anion gap: 8 (ref 5–15)
BUN: 14 mg/dL (ref 6–20)
CHLORIDE: 99 mmol/L — AB (ref 101–111)
CO2: 28 mmol/L (ref 22–32)
CREATININE: 0.88 mg/dL (ref 0.44–1.00)
Calcium: 8.8 mg/dL — ABNORMAL LOW (ref 8.9–10.3)
GFR calc non Af Amer: 60 mL/min — ABNORMAL LOW (ref >60)
Glucose, Bld: 90 mg/dL (ref 65–99)
POTASSIUM: 3.9 mmol/L (ref 3.5–5.1)
SODIUM: 135 mmol/L (ref 135–145)

## 2016-05-10 LAB — HEMOGLOBIN A1C
Hgb A1c MFr Bld: 5.7 % — ABNORMAL HIGH (ref 4.8–5.6)
Mean Plasma Glucose: 117 mg/dL

## 2016-05-10 LAB — PROTIME-INR
INR: 1.43 (ref 0.00–1.49)
PROTHROMBIN TIME: 17.5 s — AB (ref 11.6–15.2)

## 2016-05-10 LAB — LACTATE DEHYDROGENASE: LDH: 154 U/L (ref 98–192)

## 2016-05-10 MED ORDER — WARFARIN - PHARMACIST DOSING INPATIENT
Freq: Every day | Status: DC
  Administered 2016-05-10: 18:00:00

## 2016-05-10 MED ORDER — WARFARIN SODIUM 5 MG PO TABS
5.0000 mg | ORAL_TABLET | Freq: Once | ORAL | Status: AC
  Administered 2016-05-10: 5 mg via ORAL
  Filled 2016-05-10: qty 1

## 2016-05-10 MED ORDER — LIDOCAINE HCL (PF) 1 % IJ SOLN
INTRAMUSCULAR | Status: AC
  Administered 2016-05-10: 12:00:00
  Filled 2016-05-10: qty 10

## 2016-05-10 NOTE — Progress Notes (Addendum)
ANTICOAGULATION CONSULT NOTE - Initial Consult  Pharmacy Consult for warfarin Indication: atrial fibrillation  Allergies  Allergen Reactions  . Penicillins Swelling and Other (See Comments)    "Felt like brain was swelling out of her head"  . Codeine Other (See Comments)    Stomach issues  . Shellfish Allergy Hives and Itching  . Sulfonamide Derivatives Other (See Comments)    Immune to this    Patient Measurements: Height: '5\' 5"'$  (165.1 cm) Weight: 234 lb 1.6 oz (106.187 kg) (scale c) IBW/kg (Calculated) : 57  Vital Signs: Temp: 97.8 F (36.6 C) (06/01 1256) Temp Source: Oral (06/01 1256) BP: 103/57 mmHg (06/01 1256) Pulse Rate: 71 (06/01 1256)  Labs:  Recent Labs  05/08/16 1310 05/08/16 2243 05/09/16 0413 05/09/16 1233 05/10/16 0250  HGB 10.2*  --  9.9*  --  9.1*  HCT 32.8*  --  32.0*  --  29.9*  PLT 329  --  289  --  305  LABPROT 27.3*  --  22.4* 18.9* 17.5*  INR 2.58*  --  1.98* 1.58* 1.43  CREATININE 1.00  --  0.80  --  0.88  TROPONINI  --  <0.03  --   --   --     Estimated Creatinine Clearance: 59.7 mL/min (by C-G formula based on Cr of 0.88).   Medical History: Past Medical History  Diagnosis Date  . DOE (dyspnea on exertion)   . COPD (chronic obstructive pulmonary disease) ([place])   . Anxiety   . CHF (congestive heart failure) ([place])   . Diverticulitis of colon   . Paroxysmal atrial fibrillation (HCC)   . HTN (hypertension)   . Obesity   . Hyperlipidemia   . Full dentures   . Wears glasses   . GERD (gastroesophageal reflux disease)   . History of DVT (deep vein thrombosis) 1960s    BLE  . Presence of permanent cardiac pacemaker   . Hypothyroidism   . Hypokalemia 03/05/2016  . Sick sinus syndrome ([place]) 03/05/16    MDT PPM gen change Dr. Lovena Le  . Pleural effusion, right 05/08/2016    w/lymphadenopathy   . Cancer of right breast ([place])   . Kidney stones     "passed it"  . Type II diabetes mellitus ([place])   . Pneumonia 1990s  . OSA  (obstructive sleep apnea)     has a cpap-cannot use (05/08/2016)  . Arthritis     "back, neck, toes" (05/08/2016)  . Chronic cervical pain   . Chronic lower back pain   . History of gout   . Depression     Medications:  Prescriptions prior to admission  Medication Sig Dispense Refill Last Dose  . allopurinol (ZYLOPRIM) 300 MG tablet Take 300 mg by mouth daily.   05/08/2016 at Unknown time  . alum hydroxide-mag trisilicate (GAVISCON) 89-38 MG CHEW chewable tablet Chew 1 tablet by mouth 4 (four) times daily as needed for indigestion or heartburn.   Past Week at Unknown time  . amiodarone (PACERONE) 200 MG tablet Take 1 tablet (200 mg total) by mouth daily. 30 tablet 4 05/08/2016 at Unknown time  . anastrozole (ARIMIDEX) 1 MG tablet Take 1 tablet (1 mg total) by mouth daily. 90 tablet 3 05/07/2016 at Unknown time  . bismuth subsalicylate (PEPTO BISMOL) 262 MG/15ML suspension Take 30 mLs by mouth every 6 (six) hours as needed.   05/07/2016 at Unknown time  . diltiazem (CARDIZEM CD) 180 MG 24 hr capsule TAKE ONE CAPSULE BY MOUTH ONCE  DAILY   05/08/2016 at 0800  . diphenhydramine-acetaminophen (TYLENOL PM) 25-500 MG TABS tablet Take 1 tablet by mouth at bedtime as needed.   05/07/2016 at Unknown time  . furosemide (LASIX) 80 MG tablet Take 80 mg by mouth daily.   05/07/2016 at Unknown time  . HYDROcodone-acetaminophen (NORCO) 10-325 MG per tablet Take 1 tablet by mouth every 6 (six) hours as needed for severe pain. 20 tablet 0 Past Week at Unknown time  . levothyroxine (SYNTHROID, LEVOTHROID) 25 MCG tablet Take 25 mcg by mouth daily before breakfast.   5 05/08/2016 at Unknown time  . metoprolol (TOPROL-XL) 100 MG 24 hr tablet Take 100 mg by mouth daily.     05/08/2016 at 0800  . nitroGLYCERIN (NITROSTAT) 0.4 MG SL tablet Place 0.4 mg under the tongue every 5 (five) minutes as needed for chest pain (MAX 3 TABLETS).    Taking  . omeprazole (PRILOSEC) 40 MG capsule Take 40 mg by mouth daily.   05/08/2016 at  Unknown time  . Potassium Chloride ER 20 MEQ TBCR Take 40 mEq by mouth 2 (two) times daily. 120 tablet 0 05/08/2016 at Unknown time  . pravastatin (PRAVACHOL) 20 MG tablet Take 20 mg by mouth daily.   3 05/07/2016 at Unknown time  . warfarin (COUMADIN) 5 MG tablet Take 2.5-5 mg by mouth daily at 6 PM. TAKES '5MG'$  ON WED, THURS AND FRI ONLY TAKES 2.'5MG'$  ALL OTHER DAYS   05/07/2016 at Unknown time    Assessment: 64 yoF with PAF on warfarin, also remote hx of DVT. Warfarin has been on hold and reversed for thoracentesis today. Now restarting. S/p vit K '5mg'$  IV x 2. INR currently 1.43. Rate currently controlled. Has been NPO since yesterday, diet starting today. Hgb is trending down, will need to continue to monitor cbc DDI: PTA meds amiodarone and levothyroxine  PTA warfarin dose: 5 mg on W,Th,F and 2.5 mg on all other days.  Could benefit from a regimen that has doses more evenly spread throughout the week. Recommend a 5 mg M,W,F and 2.5 mg AOD  Goal of Therapy:  INR 2-3 Monitor platelets by anticoagulation protocol: Yes   Plan:  Give warfarin 5 mg po x 1 Monitor daily INR, CBC, clinical course, s/sx of bleed, PO intake, DDI   Thank you for allowing [place] to participate in this patients care. Jens Som, PharmD Pager: [phone]  05/10/2016,2:04 PM  Addendum: Damaris Schooner to patient and plan is to discharge on warfarin 5 mg M,W,F and 2.5 mg all other days. (Instead of current home regimen)

## 2016-05-10 NOTE — Progress Notes (Signed)
PROGRESS NOTE    Traci [name]  UTM:[id number] DOB: 1932-07-28 DOA: 05/08/2016 PCP: Raelene Bott, MD   Outpatient Specialists:     Brief Narrative:  Traci [name] is a 82 y.o. female with medical history significant of breast cancer (s/p of right polypectomy, currently on anastrozole), PAF on Coumadin, remote history of DVT, OSA (could not tolerate CPAP), SSS and s/p of pace maker placement, dCHF, GERD, hypothyroidism, anxiety, who presents with shortness of breath.  Patient reports that she has been having shortness of breath in the past several weeks, which has been gradually getting worse. She has dry cough. Patient does not have fever or chills. She has mild bilateral lower chest pain which is induced by coughing. Patient does not have runny nose, sore throat, tenderness over calf areas. Patient does not have nausea, vomiting, abdominal pain, diarrhea, symptoms of a UTI or unilateral weakness. She was seen by her PCP today, and sent to the hospital due to concerning for pneumonia.   Assessment & Plan:   Principal Problem:   Pleural effusion, right Active Problems:   DM (diabetes mellitus) type II controlled with renal manifestation (HCC)   Hyperlipemia   Obstructive sleep apnea   Essential hypertension   PAROXYSMAL ATRIAL FIBRILLATION   COPD (chronic obstructive pulmonary disease) (HCC)   PPM-[facility]   Breast cancer of upper-outer quadrant of right female breast (HCC)   Chronic diastolic CHF (congestive heart failure) (HCC)   Shortness of breath   Acute on chronic respiratory failure with hypoxia (HCC)   Leukocytosis   Hypoxia   GERD (gastroesophageal reflux disease)   Acute on chronic respiratory failure with hypoxia due to right pleural effusion: Patient's shortness of breath is most likely due to pleural effusion.  -s/p thoracentesis -labs and cultures pending- cytology and cytometry -Mucinex for cough -Nasal cannula oxygen -When necessary Norco for pain   Hx of  Breast Cancer: Right breast low-grade DCIS 8 mm in size. S/p of R lumpectomy with focal inferior margin positive requiring reexcision 02/15/2015 ER 100% PR 100% positive. She is on Anastrozole. She was followed up by Dr. Lindi Adie. CT-chest findings are very concerning for metastasized to disease-- suspect secondary process -will continue Anastrozole   Paroxsymal Atrial Fibrillation: CHAD-VASc score 6. She is taking coumadin with INR 2.58 on admission. HR is well controled.  - will hold coumadin for thoracentesis - resume coumadin - Continue Amiodarone, Diltiazem, Metoprolol  HTN: Bp 120/49 on admission -on Diltiazem and Metoprolol. -also on lasix for CHF  Chronic diastolic congestive heart failure: 2-D echo on 03/03/16 showed EF 60%. BNP 69.2. Patient does not have leg edema or JVD. CHF is compensated. -Continue metoprolol and home dose of Lasix, 80 mg daily  Hypothyroidism: Last TSH was 4.110 on 03/02/16 -Continue home Synthroid  OSA: Patient reports she cannot tolerate CPAP and does not use it at home.   Diet controlled DM-II:  -SSI Hgba1C 5.7  HLD: Last LDL was not on record -Continue home medications:  -LCL 45  Leukocytosis: Patient does not have other signs of infection, likely due to demargination. -will hold off Abx -will get blood culture if develops fever -f/u by CBC  COPD: stable. No wheezing or rhonchi on auscultation. -When necessary albuterol for wheezing  GERD: -Protonix   DVT prophylaxis:  SCD's  Code Status: DNR   Family Communication:   Disposition Plan:     Consultants:   Dr. Lindi Adie  Procedures:   Thoracentesis     Subjective: Nervous about thoracentesis  Objective:  Filed Vitals:   05/10/16 1210 05/10/16 1216 05/10/16 1220 05/10/16 1256  BP: 105/47 104/48 107/42 103/57  Pulse:    71  Temp:    97.8 F (36.6 C)  TempSrc:    Oral  Resp:    18  Height:      Weight:      SpO2:        Intake/Output Summary (Last 24 hours) at  05/10/16 1327 Last data filed at 05/10/16 1158  Gross per 24 hour  Intake    240 ml  Output   1800 ml  Net  -1560 ml   Filed Weights   05/08/16 2138 05/09/16 0428 05/10/16 0602  Weight: 107.366 kg (236 lb 11.2 oz) 106.777 kg (235 lb 6.4 oz) 106.187 kg (234 lb 1.6 oz)    Examination:  General exam: pleasant/cooperative Respiratory system: Clear to auscultation. Respiratory effort normal. Cardiovascular system: irr. No JVD, murmurs, rubs, gallops or clicks. No pedal edema. Gastrointestinal system: Abdomen is nondistended, soft and nontender. No organomegaly or masses felt. Normal bowel sounds heard. Central nervous system: Alert and oriented. No focal neurological deficits.     Data Reviewed: I have personally reviewed following labs and imaging studies  CBC:  Recent Labs Lab 05/08/16 1310 05/09/16 0413 05/10/16 0250  WBC 14.2* 14.3* 14.2*  NEUTROABS 11.7*  --   --   HGB 10.2* 9.9* 9.1*  HCT 32.8* 32.0* 29.9*  MCV 88.9 87.4 87.9  PLT 329 289 488   Basic Metabolic Panel:  Recent Labs Lab 05/08/16 1310 05/09/16 0413 05/10/16 0250  NA 137 136 135  K 4.5 4.4 3.9  CL 102 103 99*  CO2 '26 26 28  '$ GLUCOSE 118* 109* 90  BUN '15 14 14  '$ CREATININE 1.00 0.80 0.88  CALCIUM 9.3 9.2 8.8*   GFR: Estimated Creatinine Clearance: 59.7 mL/min (by C-G formula based on Cr of 0.88). Liver Function Tests:  Recent Labs Lab 05/08/16 1310  AST 29  ALT 21  ALKPHOS 76  BILITOT 0.9  PROT 6.6  ALBUMIN 3.4*   No results for input(s): LIPASE, AMYLASE in the last 168 hours. No results for input(s): AMMONIA in the last 168 hours. Coagulation Profile:  Recent Labs Lab 05/08/16 1310 05/09/16 0413 05/09/16 1233 05/10/16 0250  INR 2.58* 1.98* 1.58* 1.43   Cardiac Enzymes:  Recent Labs Lab 05/08/16 2243  TROPONINI <0.03   BNP (last 3 results) No results for input(s): PROBNP in the last 8760 hours. HbA1C:  Recent Labs  05/09/16 0413  HGBA1C 5.7*   CBG:  Recent  Labs Lab 05/09/16 1151 05/09/16 1621 05/09/16 2156 05/10/16 0651 05/10/16 1245  GLUCAP 120* 95 97 102* 116*   Lipid Profile:  Recent Labs  05/09/16 0413  CHOL 103  HDL 44  LDLCALC 45  TRIG 71  CHOLHDL 2.3   Thyroid Function Tests: No results for input(s): TSH, T4TOTAL, FREET4, T3FREE, THYROIDAB in the last 72 hours. Anemia Panel: No results for input(s): VITAMINB12, FOLATE, FERRITIN, TIBC, IRON, RETICCTPCT in the last 72 hours. Urine analysis: No results found for: COLORURINE, APPEARANCEUR, LABSPEC, Zeb, GLUCOSEU, HGBUR, BILIRUBINUR, KETONESUR, PROTEINUR, UROBILINOGEN, NITRITE, LEUKOCYTESUR   )No results found for this or any previous visit (from the past 240 hour(s)).    Anti-infectives    None       Radiology Studies: Dg Chest 1 View  05/10/2016  CLINICAL DATA:  Right-sided thoracentesis.  Pleural effusion. EXAM: CHEST 1 VIEW COMPARISON:  05/08/2016 FINDINGS: Pacer with leads at right atrium and right  ventricle. No lead discontinuity. Midline trachea. Cardiomegaly accentuated by AP portable technique. Atherosclerosis in the transverse aorta. Small right pleural effusion is decreased. No pneumothorax. Improved right base aeration. Persistent left base atelectasis or scar. IMPRESSION: No pneumothorax. Decreased right pleural effusion with improved right base aeration. Cardiomegaly without congestive failure. Electronically Signed   By: Abigail Miyamoto M.D.   On: 05/10/2016 12:47   Dg Chest 2 View  05/08/2016  CLINICAL DATA:  Shortness of breath for 2 weeks EXAM: CHEST  2 VIEW COMPARISON:  05/03/2016 FINDINGS: Cardiac shadow is mildly enlarged. The left lung remains clear. An enlarging right-sided pleural effusion is noted with underlying atelectasis. No bony abnormality is noted. A pacing device is again seen. IMPRESSION: Enlarging right-sided pleural effusion Electronically Signed   By: Inez Catalina M.D.   On: 05/08/2016 14:20   Ct Chest W Contrast  05/08/2016  CLINICAL  DATA:  Increasing pneumonia, shortness of breath and productive cough. EXAM: CT CHEST WITH CONTRAST TECHNIQUE: Multidetector CT imaging of the chest was performed during intravenous contrast administration. CONTRAST:  37m ISOVUE-300 IOPAMIDOL (ISOVUE-300) INJECTION 61% COMPARISON:  Chest CT dated 05/03/2016. FINDINGS: Mediastinum/Lymph Nodes: Again noted is the multiple large lymph nodes within the mediastinum. Largest lymph node is again seen within the right lower peritracheal space measuring 3.3 cm short axis dimension. Additional confluent lymphadenopathy is again seen within the sub- carinal space measuring approximately 3 cm short axis dimension. An additional abnormal lymph node in the prevascular space measures 1.3 cm short axis dimension. Several other smaller lymph nodes are noted. There is an irregular mass along the right lateral margin of the upper mediastinum, measuring 2.2 x 1.5 cm (series 201, image 16) which could be either a mediastinal mass or parenchymal mass. Heart size is normal. Trace pericardial effusion again appreciated. Scattered atherosclerotic changes are seen along the walls of the normal-caliber thoracic aorta. No aortic aneurysm or dissection. Both thyroid lobes appear enlarged and slightly heterogeneous. A 1.2 cm nodule in the left thyroid lobe was better seen on the earlier chest CT. Lungs/Pleura: The moderate-to-large right pleural effusion appears stable in size and extent, at least partially loculated, with adjacent compressive atelectasis which also appears stable in extent. No new lung findings. Milder atelectasis noted at the left lung base. Small emphysematous blebs noted at each lung apex. Upper abdomen: Limited images of the upper abdomen are unremarkable. Again noted is a left renal cyst, incompletely imaged. Again noted is the nodule within the left adrenal gland measuring 2.1 x 1.3 cm. Musculoskeletal: Degenerative changes are seen throughout the thoracolumbar spine. No  acute or suspicious osseous lesion. Superficial soft tissues are unremarkable. IMPRESSION: 1. Right pleural effusion, moderate to large in size, stable compared to earlier chest CT of 05/03/2016. The pleural effusion again appears to be at least partially loculated. The adjacent compressive atelectasis is stable in extent. No evidence of pneumonia seen. 2. Milder atelectasis at the left lung base. Left lung otherwise clear. 3. Extensive mediastinal lymphadenopathy. As described on the earlier CT report, the large size of the mediastinal lymph nodes suggests metastatic disease or lymphoma. 4. Irregular nodule/mass bordering the right lateral margin of the upper mediastinum, measuring 2.2 x 1.5 cm, additional mediastinal mass versus neoplastic pulmonary mass. 5. Trace pericardial effusion, stable.  Heart size is normal. 6. Heterogeneously enlarged thyroid. 7. Left adrenal mass, measuring 2.1 cm, adenoma versus metastasis. Electronically Signed   By: SFranki CabotM.D.   On: 05/08/2016 18:57  Scheduled Meds: . allopurinol  300 mg Oral Daily  . amiodarone  200 mg Oral Daily  . anastrozole  1 mg Oral Daily  . diltiazem  180 mg Oral Daily  . doxazosin  2 mg Oral Daily  . furosemide  80 mg Oral Daily  . guaiFENesin  600 mg Oral BID  . insulin aspart  0-5 Units Subcutaneous QHS  . insulin aspart  0-9 Units Subcutaneous TID WC  . levothyroxine  25 mcg Oral QAC breakfast  . metoprolol succinate  100 mg Oral Daily  . pantoprazole  40 mg Oral Daily  . pravastatin  20 mg Oral q1800  . sodium chloride flush  3 mL Intravenous Q12H   Continuous Infusions:    LOS: 2 days    Time spent: 25 min    [place], DO [facility] Pager [phone]  If 7PM-7AM, please contact night-coverage [email or web address] Password [email or web address] 05/10/2016, 1:27 PM

## 2016-05-10 NOTE — Discharge Instructions (Signed)

## 2016-05-10 NOTE — Progress Notes (Signed)
OT Cancellation Note  Patient Details Name: Traci [name] MRN: [id number] DOB: Apr 06, 1932   Cancelled Treatment:    Reason Eval/Treat Not Completed: Other (comment) (Pt distressed by recent news of family member.) Pt reports just receiving news that family member had a heart attack, requests OT to come back later. Pt also reports she will have procedure today. Will follow up as able, likely tomorrow due to procedure.   Hortencia Pilar 05/10/2016, 9:05 AM

## 2016-05-10 NOTE — Telephone Encounter (Signed)
Cheree Ditto, RN at 05/08/2016 4:04 PM     Status: Signed       Expand All Collapse All   Received call from Priscille Kluver, NP. Pt currently at her facility and she is concerned with worsening symptoms. Priscille Kluver, NP is wanting to admit pt; however, is requesting Dr. Geralyn Flash input as to if pt should be admitted through [facility] or to [facility] ED. Per Lindi Adie, MD, [facility] ED is ideal location. This information conveyed to Priscille Kluver, NP who states pt will be sent to our facility for further work up.

## 2016-05-10 NOTE — Procedures (Signed)
Successful US guided right thoracentesis. Yielded 1.9L of dark bloody pleural fluid. Pt tolerated procedure well. No immediate complications.  Specimen was sent for labs. CXR ordered.  Ascencion Dike PA-C 05/10/2016 12:48 PM

## 2016-05-11 DIAGNOSIS — I480 Paroxysmal atrial fibrillation: Secondary | ICD-10-CM

## 2016-05-11 DIAGNOSIS — R0902 Hypoxemia: Secondary | ICD-10-CM

## 2016-05-11 DIAGNOSIS — D72829 Elevated white blood cell count, unspecified: Secondary | ICD-10-CM

## 2016-05-11 LAB — BASIC METABOLIC PANEL
Anion gap: 8 (ref 5–15)
BUN: 18 mg/dL (ref 6–20)
CHLORIDE: 98 mmol/L — AB (ref 101–111)
CO2: 29 mmol/L (ref 22–32)
Calcium: 8.9 mg/dL (ref 8.9–10.3)
Creatinine, Ser: 1.08 mg/dL — ABNORMAL HIGH (ref 0.44–1.00)
GFR calc non Af Amer: 46 mL/min — ABNORMAL LOW (ref >60)
GFR, EST AFRICAN AMERICAN: 54 mL/min — AB (ref >60)
Glucose, Bld: 94 mg/dL (ref 65–99)
POTASSIUM: 3.6 mmol/L (ref 3.5–5.1)
SODIUM: 135 mmol/L (ref 135–145)

## 2016-05-11 LAB — GLUCOSE, CAPILLARY
GLUCOSE-CAPILLARY: 101 mg/dL — AB (ref 65–99)
Glucose-Capillary: 100 mg/dL — ABNORMAL HIGH (ref 65–99)

## 2016-05-11 LAB — CBC
HEMATOCRIT: 31.4 % — AB (ref 36.0–46.0)
HEMOGLOBIN: 9.6 g/dL — AB (ref 12.0–15.0)
MCH: 27.3 pg (ref 26.0–34.0)
MCHC: 30.6 g/dL (ref 30.0–36.0)
MCV: 89.2 fL (ref 78.0–100.0)
Platelets: 312 10*3/uL (ref 150–400)
RBC: 3.52 MIL/uL — ABNORMAL LOW (ref 3.87–5.11)
RDW: 14.9 % (ref 11.5–15.5)
WBC: 15.9 10*3/uL — ABNORMAL HIGH (ref 4.0–10.5)

## 2016-05-11 LAB — PROTIME-INR
INR: 1.38 (ref 0.00–1.49)
Prothrombin Time: 17.1 seconds — ABNORMAL HIGH (ref 11.6–15.2)

## 2016-05-11 MED ORDER — AMOXICILLIN-POT CLAVULANATE 875-125 MG PO TABS: 1.0000 | ORAL_TABLET | Freq: Two times a day (BID) | ORAL | Status: DC

## 2016-05-11 MED ORDER — CEFUROXIME AXETIL 500 MG PO TABS: 500.0000 mg | ORAL_TABLET | Freq: Two times a day (BID) | ORAL | Status: DC

## 2016-05-11 MED ORDER — WARFARIN SODIUM 5 MG PO TABS: 5.0000 mg | ORAL_TABLET | Freq: Once | ORAL | Status: DC

## 2016-05-11 NOTE — Progress Notes (Signed)
ANTICOAGULATION CONSULT NOTE - Initial Consult  Pharmacy Consult for warfarin Indication: atrial fibrillation  Allergies  Allergen Reactions  . Penicillins Swelling and Other (See Comments)    "Felt like brain was swelling out of her head"  . Codeine Other (See Comments)    Stomach issues  . Shellfish Allergy Hives and Itching  . Sulfonamide Derivatives Other (See Comments)    Immune to this    Patient Measurements: Height: '5\' 5"'$  (165.1 cm) Weight: 229 lb 1.6 oz (103.919 kg) (scale c) IBW/kg (Calculated) : 57  Vital Signs: Temp: 98.6 F (37 C) (06/02 0556) Temp Source: Oral (06/02 0556) BP: 109/52 mmHg (06/02 0952) Pulse Rate: 72 (06/02 0952)  Labs:  Recent Labs  05/08/16 2243 05/09/16 0413 05/09/16 1233 05/10/16 0250 05/11/16 0301  HGB  --  9.9*  --  9.1* 9.6*  HCT  --  32.0*  --  29.9* 31.4*  PLT  --  289  --  305 312  LABPROT  --  22.4* 18.9* 17.5* 17.1*  INR  --  1.98* 1.58* 1.43 1.38  CREATININE  --  0.80  --  0.88 1.08*  TROPONINI <0.03  --   --   --   --     Estimated Creatinine Clearance: 48.1 mL/min (by C-G formula based on Cr of 1.08).   Assessment: 26 yoF with PAF on warfarin, also remote hx of DVT. Warfarin has been on hold and reversed for thoracentesis. Now restarting. S/p vit K '5mg'$  IV x 2. INR currently 1.38. Rate currently controlled.   Anticoag/Cardio: afib: CHAD-VASc score 6. INR 1.38  Home Dose: warfarin 5 mg on W,Th,F? and 2.5 mg on all other days.   Renal: SCr 1.08  Hem/Onc: H&H 9.6/31.4, Plt 312  Goal of Therapy:  INR 2-3 Monitor platelets by anticoagulation protocol: Yes   Plan:  Give warfarin 5 mg po x 1 Monitor daily INR, CBC, clinical course, s/sx of bleed, PO intake, DDI  Prior Rph has spoken to patient and recommended discharge dose of  5 mg M,W,F and 2.5 mg all other days.  Levester Fresh, PharmD, BCPS, [facility] Clinical Pharmacist Pager [phone] 05/11/2016 12:27 PM

## 2016-05-11 NOTE — Care Management Note (Signed)
Case Management Note  Patient Details  Name: Traci [name] MRN: [id number] Date of Birth: 05-Jul-1932  Action/Plan:  CM talked to patient with spouse present about [place] choices, patient / spouse chose [facility].Tiffany with [facility] called for arrangements. Home 02 delivered to the room. Patient have a cane, walker and elevated commode chair at home.    Expected Discharge Date:    05/11/2016              Expected Discharge Plan:  [place]  Discharge planning Services  CM Consult  Choice offered to:  Patient, Spouse, Adult Children  DME Arranged:  [facility] DME Agency:  [place]:  PT [facility] Agency:  [place]  Status of Service:  In process, will continue to follow  Medicare Important Message Given:  Yes  Sherrilyn Rist [phone] 05/11/2016, 4:03 PM

## 2016-05-11 NOTE — Evaluation (Signed)
Occupational Therapy Evaluation Patient Details Name: Traci [name] MRN: [id number] DOB: 1932-09-30 Today's Date: 05/11/2016    History of Present Illness 82 y.o. female with medical history significant of breast cancer (s/p of right polypectomy, currently on anastrozole), PAF on Coumadin, remote history of DVT, OSA (could not tolerate CPAP), SSS and s/p of pace maker placement, dCHF, GERD, hypothyroidism, anxiety, who presents with shortness of breath. CT shows 2.1 cm mass - adenoma vs. metastasis   Clinical Impression   Pt was admitted for the above. She had intermittent assistance at home for adls. She will benefit from continued OT in acute to reinforce energy conservation and to build stamina for adls. Goals in acute are for supervision level    Follow Up Recommendations  Supervision/Assistance - 24 hour    Equipment Recommendations  None recommended by OT    Recommendations for Other Services       Precautions / Restrictions Precautions Precautions: Fall Restrictions Weight Bearing Restrictions: No      Mobility Bed Mobility               General bed mobility comments: up in recliner  Transfers   Equipment used: Rolling walker (2 wheeled) Transfers: Sit to/from Stand Sit to Stand: Supervision         General transfer comment: pt used SPC    Balance                                            ADL Overall ADL's : Needs assistance/impaired     Grooming: Supervision/safety;Standing   Upper Body Bathing: Set up;Sitting   Lower Body Bathing: Minimal assistance;Sit to/from stand   Upper Body Dressing : Set up;Sitting   Lower Body Dressing: Minimal assistance;Sit to/from stand   Toilet Transfer: Min guard;Ambulation;Comfort height toilet;RW   Toileting- Clothing Manipulation and Hygiene: Supervision/safety;Sit to/from stand         General ADL Comments: educated pt on energy conservation. She uses a lot of these strategies  already.  Husband will assist pt at  home.  Pt has a long sponge, she brings legs up to bed for socks and sits a lot. She also initiated rest break during session.       Vision     Perception     Praxis      Pertinent Vitals/Pain Pain Assessment: No/denies pain     Hand Dominance     Extremity/Trunk Assessment Upper Extremity Assessment Upper Extremity Assessment: Generalized weakness           Communication Communication Communication: No difficulties   Cognition Arousal/Alertness: Awake/alert Behavior During Therapy: WFL for tasks assessed/performed Overall Cognitive Status: Within Functional Limits for tasks assessed                     General Comments       Exercises       Shoulder Instructions      Home Living Family/patient expects to be discharged to:: Private residence Living Arrangements: Spouse/significant other Available Help at Discharge: Family;Available 24 hours/day Type of Home: House Home Access: Stairs to enter           Bathroom Shower/Tub: Tub/shower unit Shower/tub characteristics: Curtain Biochemist, clinical: Standard     Home Equipment: Tub bench;Grab bars - toilet;Cane - single point;Other (comment);Walker - 2 wheels;Walker - standard;Bedside commode  Prior Functioning/Environment Level of Independence: Independent with assistive device(s)             OT Diagnosis: Generalized weakness   OT Problem List: Decreased activity tolerance;Impaired balance (sitting and/or standing);Decreased knowledge of use of DME or AE   OT Treatment/Interventions: Self-care/ADL training;Energy conservation;DME and/or AE instruction;Patient/family education;Balance training    OT Goals(Current goals can be found in the care plan section) Acute Rehab OT Goals Patient Stated Goal: to go home without oxygen OT Goal Formulation: With patient Time For Goal Achievement: 05/18/16 Potential to Achieve Goals: Good ADL Goals Pt Will  Transfer to Toilet: with supervision;ambulating;bedside commode Pt Will Perform Tub/Shower Transfer: Tub transfer;tub bench;ambulating;with supervision Additional ADL Goal #1: pt will initiate a seated rest break at least every 5 minutes for energy conservation and participate in 30 minutes of activity with rest breaks  OT Frequency: Min 2X/week   Barriers to D/C:            Co-evaluation              End of Session    Activity Tolerance: Patient tolerated treatment well Patient left: in chair;with call bell/phone within reach;with family/visitor present   Time: 1455-1519 OT Time Calculation (min): 24 min Charges:  OT General Charges $OT Visit: 1 Procedure OT Evaluation $OT Eval Low Complexity: 1 Procedure OT Treatments $Self Care/Home Management : 8-22 mins G-Codes:    Coltin Casher Jun 08, 2016, 3:58 PM Lesle Chris, OTR/L [phone] Jun 08, 2016

## 2016-05-11 NOTE — Progress Notes (Signed)
Received call from [facility], they do not service the [facility] area and cannot accept the case. TCT pt son, Traci [name], he is in agreement for [facility], message left on patient home phone concerning the change with [facility] services. Mindi Slicker [facility] [phone]

## 2016-05-11 NOTE — Care Management Important Message (Signed)
Important Message  Patient Details  Name: Traci [name] MRN: [id number] Date of Birth: 1932/01/19   Medicare Important Message Given:  Yes    [name] 05/11/2016, 9:30 AM

## 2016-05-11 NOTE — Discharge Summary (Addendum)
Physician Discharge Summary  Traci [name] TOI:[id number] DOB: 1932/10/29 DOA: 05/08/2016  PCP: Raelene Bott, MD  Admit date: 05/08/2016 Discharge date: 05/11/2016   Recommendations for Outpatient Follow-Up:   Dr Lindi Adie to follow up on culture, cytology, flow cytometry INR next week    Discharge Diagnosis:   Principal Problem:   Pleural effusion, right Active Problems:   DM (diabetes mellitus) type II controlled with renal manifestation (HCC)   Hyperlipemia   Obstructive sleep apnea   Essential hypertension   PAROXYSMAL ATRIAL FIBRILLATION   COPD (chronic obstructive pulmonary disease) (HCC)   PPM-[facility]   Breast cancer of upper-outer quadrant of right female breast (HCC)   Chronic diastolic CHF (congestive heart failure) (HCC)   Shortness of breath   Acute on chronic respiratory failure with hypoxia (HCC)   Leukocytosis   Hypoxia   GERD (gastroesophageal reflux disease)   Discharge disposition:  Home with 2L O2 and home health:  Discharge Condition: Improved.  Diet recommendation: Low sodium, heart healthy.   Wound care: None.   History of Present Illness:   Traci [name] is a 82 y.o. female with medical history significant of breast cancer (s/p of right polypectomy, currently on anastrozole), PAF on Coumadin, remote history of DVT, OSA (could not tolerate CPAP), SSS and s/p of pace maker placement, dCHF, GERD, hypothyroidism, anxiety, who presents with shortness of breath.  Patient reports that she has been having shortness of breath in the past several weeks, which has been gradually getting worse. She has dry cough. Patient does not have fever or chills. She has mild bilateral lower chest pain which is induced by coughing. Patient does not have runny nose, sore throat, tenderness over calf areas. Patient does not have nausea, vomiting, abdominal pain, diarrhea, symptoms of a UTI or unilateral weakness. She was seen by her PCP today, and sent to the hospital due  to concerning for pneumonia.   Hospital Course by Problem:   Acute on chronic respiratory failure with hypoxia due to right pleural effusion: Patient's shortness of breath is most likely due to pleural effusion.  -s/p thoracentesis- almost 2L removed -labs and cultures pending- cytology and cytometry- Dr. 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 S/p of R lumpectomy with focal inferior margin positive requiring reexcision 02/15/2015 ER 100% PR 100% positive. She is on Anastrozole. She was followed up by Dr. Lindi Adie. CT-chest findings are very concerning for metastasized to disease-- suspect secondary process -will continue Anastrozole   Paroxsymal Atrial Fibrillation: CHAD-VASc score 6. She is taking coumadin with INR 2.58 on admission. HR is well controled.  - will hold coumadin for thoracentesis - resume coumadin - Continue Amiodarone, Diltiazem, Metoprolol  HTN: Bp 120/49 on admission -on Diltiazem and Metoprolol. -also on lasix for CHF  Chronic diastolic congestive heart failure: 2-D echo on 03/03/16 showed EF 60%. BNP 69.2. Patient does not have leg edema or JVD. CHF is compensated. -Continue metoprolol and home dose of Lasix, 80 mg daily  Hypothyroidism: Last TSH was 4.110 on 03/02/16 -Continue home Synthroid  OSA: Patient reports she cannot tolerate CPAP and does not use it at home.   Diet controlled DM-II:  -SSI Hgba1C 5.7  HLD: Last LDL was not on record -Continue home medications:  -LCL 45  Leukocytosis:  Elevated WBC count  on thoracentesis-- will treat with augmentin-- patient says she has tolerated Po PCN in past-- just IV/IM was not well tolerated-- caused headache-- discussed abx with patient and family in detail-- patient to stop if itching/rash were to  occur -f/u by CBC as outpatient  COPD: stable. No wheezing or rhonchi on auscultation. -When necessary albuterol for wheezing  GERD: -Protonix    Medical Consultants:    IR  oncology   Discharge Exam:   Filed Vitals:   05/11/16 0556 05/11/16 0952  BP: 116/46 109/52  Pulse: 61 72  Temp: 98.6 F (37 C)   Resp: 18    Filed Vitals:   05/10/16 1256 05/10/16 2025 05/11/16 0556 05/11/16 0952  BP: 103/57 103/89 116/46 109/52  Pulse: 71 66 61 72  Temp: 97.8 F (36.6 C) 99 F (37.2 C) 98.6 F (37 C)   TempSrc: Oral Oral Oral   Resp: '18 18 18   '$ Height:      Weight:   103.919 kg (229 lb 1.6 oz)   SpO2:  95% 97%     Gen:  NAD    The results of significant diagnostics from this hospitalization (including imaging, microbiology, ancillary and laboratory) are listed below for reference.     Procedures and Diagnostic Studies:   Dg Chest 2 View  05/08/2016  CLINICAL DATA:  Shortness of breath for 2 weeks EXAM: CHEST  2 VIEW COMPARISON:  05/03/2016 FINDINGS: Cardiac shadow is mildly enlarged. The left lung remains clear. An enlarging right-sided pleural effusion is noted with underlying atelectasis. No bony abnormality is noted. A pacing device is again seen. IMPRESSION: Enlarging right-sided pleural effusion Electronically Signed   By: Inez Catalina M.D.   On: 05/08/2016 14:20   Ct Chest W Contrast  05/08/2016  CLINICAL DATA:  Increasing pneumonia, shortness of breath and productive cough. EXAM: CT CHEST WITH CONTRAST TECHNIQUE: Multidetector CT imaging of the chest was performed during intravenous contrast administration. CONTRAST:  21m ISOVUE-300 IOPAMIDOL (ISOVUE-300) INJECTION 61% COMPARISON:  Chest CT dated 05/03/2016. FINDINGS: Mediastinum/Lymph Nodes: Again noted is the multiple large lymph nodes within the mediastinum. Largest lymph node is again seen within the right lower peritracheal space measuring 3.3 cm short axis dimension. Additional confluent lymphadenopathy is  again seen within the sub- carinal space measuring approximately 3 cm short axis dimension. An additional abnormal lymph node in the prevascular space measures 1.3 cm short axis dimension. Several other smaller lymph nodes are noted. There is an irregular mass along the right lateral margin of the upper mediastinum, measuring 2.2 x 1.5 cm (series 201, image 16) which could be either a mediastinal mass or parenchymal mass. Heart size is normal. Trace pericardial effusion again appreciated. Scattered atherosclerotic changes are seen along the walls of the normal-caliber thoracic aorta. No aortic aneurysm or dissection. Both thyroid lobes appear enlarged and slightly heterogeneous. A 1.2 cm nodule in the left thyroid lobe was better seen on the earlier chest CT. Lungs/Pleura: The moderate-to-large right pleural effusion appears stable in size and extent, at least partially loculated, with adjacent compressive atelectasis which also appears stable in extent. No new lung findings. Milder atelectasis noted at the left lung base. Small emphysematous blebs noted at each lung apex. Upper abdomen: Limited images of the upper abdomen are unremarkable. Again noted is a left renal cyst, incompletely imaged. Again noted is the nodule within the left adrenal gland measuring 2.1 x 1.3 cm. Musculoskeletal: Degenerative changes are seen throughout the thoracolumbar spine. No acute or suspicious osseous lesion.  Superficial soft tissues are unremarkable. IMPRESSION: 1. Right pleural effusion, moderate to large in size, stable compared to earlier chest CT of 05/03/2016. The pleural effusion again appears to be at least partially loculated. The adjacent compressive atelectasis is stable in extent. No evidence of pneumonia seen. 2. Milder atelectasis at the left lung base. Left lung otherwise clear. 3. Extensive mediastinal lymphadenopathy. As described on the earlier CT report, the large size of the mediastinal lymph nodes suggests  metastatic disease or lymphoma. 4. Irregular nodule/mass bordering the right lateral margin of the upper mediastinum, measuring 2.2 x 1.5 cm, additional mediastinal mass versus neoplastic pulmonary mass. 5. Trace pericardial effusion, stable.  Heart size is normal. 6. Heterogeneously enlarged thyroid. 7. Left adrenal mass, measuring 2.1 cm, adenoma versus metastasis. Electronically Signed   By: Franki Cabot M.D.   On: 05/08/2016 18:57     Labs:   Basic Metabolic Panel:  Recent Labs Lab 05/08/16 1310 05/09/16 0413 05/10/16 0250 05/11/16 0301  NA 137 136 135 135  K 4.5 4.4 3.9 3.6  CL 102 103 99* 98*  CO2 '26 26 28 29  '$ GLUCOSE 118* 109* 90 94  BUN '15 14 14 18  '$ CREATININE 1.00 0.80 0.88 1.08*  CALCIUM 9.3 9.2 8.8* 8.9   GFR Estimated Creatinine Clearance: 48.1 mL/min (by C-G formula based on Cr of 1.08). Liver Function Tests:  Recent Labs Lab 05/08/16 1310  AST 29  ALT 21  ALKPHOS 76  BILITOT 0.9  PROT 6.6  ALBUMIN 3.4*   No results for input(s): LIPASE, AMYLASE in the last 168 hours. No results for input(s): AMMONIA in the last 168 hours. Coagulation profile  Recent Labs Lab 05/08/16 1310 05/09/16 0413 05/09/16 1233 05/10/16 0250 05/11/16 0301  INR 2.58* 1.98* 1.58* 1.43 1.38    CBC:  Recent Labs Lab 05/08/16 1310 05/09/16 0413 05/10/16 0250 05/11/16 0301  WBC 14.2* 14.3* 14.2* 15.9*  NEUTROABS 11.7*  --   --   --   HGB 10.2* 9.9* 9.1* 9.6*  HCT 32.8* 32.0* 29.9* 31.4*  MCV 88.9 87.4 87.9 89.2  PLT 329 289 305 312   Cardiac Enzymes:  Recent Labs Lab 05/08/16 2243  TROPONINI <0.03   BNP: Invalid input(s): POCBNP CBG:  Recent Labs Lab 05/10/16 1245 05/10/16 1717 05/10/16 2143 05/11/16 0632 05/11/16 1220  GLUCAP 116* 110* 120* 101* 100*   D-Dimer No results for input(s): DDIMER in the last 72 hours. Hgb A1c  Recent Labs  05/09/16 0413  HGBA1C 5.7*   Lipid Profile  Recent Labs  05/09/16 0413  CHOL 103  HDL 44  LDLCALC 45   TRIG 71  CHOLHDL 2.3   Thyroid function studies No results for input(s): TSH, T4TOTAL, T3FREE, THYROIDAB in the last 72 hours.  Invalid input(s): FREET3 Anemia work up No results for input(s): VITAMINB12, FOLATE, FERRITIN, TIBC, IRON, RETICCTPCT in the last 72 hours. Microbiology Recent Results (from the past 240 hour(s))  Gram stain     Status: None   Collection Time: 05/10/16 12:41 PM  Result Value Ref Range Status   Specimen Description FLUID RIGHT PLEURAL  Final   Special Requests NONE  Final   Gram Stain   Final    MODERATE WBC PRESENT,BOTH PMN AND MONONUCLEAR NO ORGANISMS SEEN    Report Status 05/10/2016 FINAL  Final     Discharge Instructions:   Discharge Instructions    Diet - low sodium heart healthy    Complete by:  As directed  Diet Carb Modified    Complete by:  As directed      Discharge instructions    Complete by:  As directed   INR on Thursday Home health PT and O2     Increase activity slowly    Complete by:  As directed             Medication List    TAKE these medications        allopurinol 300 MG tablet  Commonly known as:  ZYLOPRIM  Take 300 mg by mouth daily.     alum hydroxide-mag trisilicate 79-39 MG Chew chewable tablet  Commonly known as:  GAVISCON  Chew 1 tablet by mouth 4 (four) times daily as needed for indigestion or heartburn.     amiodarone 200 MG tablet  Commonly known as:  PACERONE  Take 1 tablet (200 mg total) by mouth daily.     amoxicillin-clavulanate 875-125 MG tablet  Commonly known as:  AUGMENTIN  Take 1 tablet by mouth every 12 (twelve) hours.     anastrozole 1 MG tablet  Commonly known as:  ARIMIDEX  Take 1 tablet (1 mg total) by mouth daily.     bismuth subsalicylate 030 SP/23RA suspension  Commonly known as:  PEPTO BISMOL  Take 30 mLs by mouth every 6 (six) hours as needed.     diltiazem 180 MG 24 hr capsule  Commonly known as:  CARDIZEM CD  TAKE ONE CAPSULE BY MOUTH ONCE DAILY      diphenhydramine-acetaminophen 25-500 MG Tabs tablet  Commonly known as:  TYLENOL PM  Take 1 tablet by mouth at bedtime as needed.     furosemide 80 MG tablet  Commonly known as:  LASIX  Take 80 mg by mouth daily.     HYDROcodone-acetaminophen 10-325 MG tablet  Commonly known as:  NORCO  Take 1 tablet by mouth every 6 (six) hours as needed for severe pain.     levothyroxine 25 MCG tablet  Commonly known as:  SYNTHROID, LEVOTHROID  Take 25 mcg by mouth daily before breakfast.     metoprolol succinate 100 MG 24 hr tablet  Commonly known as:  TOPROL-XL  Take 100 mg by mouth daily.     nitroGLYCERIN 0.4 MG SL tablet  Commonly known as:  NITROSTAT  Place 0.4 mg under the tongue every 5 (five) minutes as needed for chest pain (MAX 3 TABLETS).     omeprazole 40 MG capsule  Commonly known as:  PRILOSEC  Take 40 mg by mouth daily.     Potassium Chloride ER 20 MEQ Tbcr  Take 40 mEq by mouth 2 (two) times daily.     pravastatin 20 MG tablet  Commonly known as:  PRAVACHOL  Take 20 mg by mouth daily.     warfarin 5 MG tablet  Commonly known as:  COUMADIN  Take 2.5-5 mg by mouth daily at 6 PM. TAKES '5MG'$  ON WED, THURS AND FRI ONLY TAKES 2.'5MG'$  ALL OTHER DAYS           Follow-up Information    Follow up with [facility], MD In 1 week.   Specialty:  Internal Medicine   Why:  needs PT/INR follow up   Contact information:   [place] [address] [phone]        Time coordinating discharge: 35 min  Signed:  Georgiana Spillane Alison Stalling   [facility] 05/11/2016, 3:32 PM

## 2016-05-11 NOTE — Progress Notes (Signed)
SATURATION QUALIFICATIONS: (This note is used to comply with regulatory documentation for home oxygen)  Patient Saturations on Room Air at Rest = 94%  Patient Saturations on Room Air while Ambulating =87%  Patient Saturations on 2 Liters of oxygen while Ambulating = 93%  Please briefly explain why patient needs home oxygen:  During ambulation patient became very short of breath on room air and weak and oxygen sats dropped to 87%. Back to chair it took several minutes for sats to return to RA baseline of 94%.   Cyndia Bent

## 2016-05-15 LAB — CULTURE, BODY FLUID-BOTTLE: CULTURE: NO GROWTH

## 2016-05-15 LAB — CULTURE, BODY FLUID W GRAM STAIN -BOTTLE

## 2016-05-16 ENCOUNTER — Encounter: Payer: Self-pay | Admitting: Internal Medicine

## 2016-05-16 ENCOUNTER — Other Ambulatory Visit: Payer: Self-pay | Admitting: Hematology and Oncology

## 2016-05-16 ENCOUNTER — Telehealth: Payer: Self-pay | Admitting: Hematology and Oncology

## 2016-05-16 NOTE — Telephone Encounter (Signed)
lvm to iform patient of [facility] appt per VG 6/7 pof on 6/12 at 1015 am

## 2016-05-21 ENCOUNTER — Telehealth: Payer: Self-pay | Admitting: Hematology and Oncology

## 2016-05-21 ENCOUNTER — Ambulatory Visit (HOSPITAL_BASED_OUTPATIENT_CLINIC_OR_DEPARTMENT_OTHER): Payer: Medicare Other | Admitting: Hematology and Oncology

## 2016-05-21 ENCOUNTER — Encounter: Payer: Self-pay | Admitting: Hematology and Oncology

## 2016-05-21 ENCOUNTER — Ambulatory Visit (HOSPITAL_COMMUNITY)
Admission: RE | Admit: 2016-05-21 | Discharge: 2016-05-21 | Disposition: A | Discharge status: Home / Self Care | Payer: Medicare Other | Source: Ambulatory Visit | Attending: Hematology and Oncology | Admitting: Hematology and Oncology

## 2016-05-21 VITALS — BP 106/65 | HR 85 | Temp 98.7°F | Resp 19 | Ht 65.0 in | Wt 236.6 lb

## 2016-05-21 DIAGNOSIS — Z79811 Long term (current) use of aromatase inhibitors: Secondary | ICD-10-CM | POA: Diagnosis not present

## 2016-05-21 DIAGNOSIS — Z170 Estrogen receptor positive status [ER+]: Secondary | ICD-10-CM

## 2016-05-21 DIAGNOSIS — D0511 Intraductal carcinoma in situ of right breast: Secondary | ICD-10-CM | POA: Diagnosis not present

## 2016-05-21 DIAGNOSIS — J910 Malignant pleural effusion: Secondary | ICD-10-CM | POA: Diagnosis not present

## 2016-05-21 NOTE — Assessment & Plan Note (Signed)
Large right pleural effusion with mediastinal lymphadenopathy 3.3 cm, subcarinal lymph node 3 cm prevascular node 1.3 cm in addition to a pulmonary mass measuring 2.2 x 1.5 cm. detected 05/09/2016  Thoracentesis 05/10/2016: Atypical cells that were TTF-1 positive and faintly positive for MOC 31, malignancy cannot be entirely ruled out  Pathology review: I discussed with the patient that it appears that this most likely could be lung cancer. It would be considered cases stage IV a based upon the pleural effusion. We do not yet know if there is any distant metastatic disease. In order to accurately diagnose, we will need more definite pathologic diagnosis and might require [place] to perform mediastinoscopy for a biopsy of the mediastinal lymph nodes.

## 2016-05-21 NOTE — Progress Notes (Signed)
Patient Care Team: Traci Bott, MD as PCP - General (Internal Medicine)  SUMMARY OF ONCOLOGIC HISTORY:   Breast cancer of upper-outer quadrant of right female breast ([place])   01/18/2015 Mammogram Right breast: 8 mm mass at 8:00 position   01/18/2015 Initial Biopsy Right breast needle core biopsy: DCIS with calcifications, low-grade, ER 100%, PR 100%   02/08/2015 Surgery Right breast lumpectomy Barry Dienes): Grade 2 DCIS, spanning 1 cm. Positive inferior margin.    02/08/2015 Pathologic Stage pTis, pNx: Stage 0   02/14/2015 Surgery Right breat re-excision Barry Dienes): Microscopic focus of DCIS. Negative margins.     Miscellaneous Discussion at breast tumor board conference was that patient would not be a candidate for adjuvant RT given her advanced age.    02/2015 -  Anti-estrogen oral therapy Anastrazole daily (due to h/o blood clots). Planned duration of treatment: 5 years. Lindi Adie)   04/29/2015 Survivorship Survivorship Care Plan mailed to pt per her request. She declined in-person visit.    NEW PROBLEM: Pleural effusion with mediastinal adenopathy and lung nodule  CHIEF COMPLIANT: Follow-up to discuss the results of thoracentesis  INTERVAL HISTORY: Traci [name] is a 82 year old with above-mentioned history of right breast DCIS who was in the hospital with malignant appearing pleural effusion was underwent thoracentesis and 1.9 L of fluid was removed. Cytology came back as atypical cells in the requiring additional tissue for further confirmation. It certainly resembles lung cancer. It is not related to her prior DCIS diagnosis. Patient is here accompanied by her family and she reports that she is having more trouble with breathing. The benefit of thoracentesis lasted for a few days. She is inquiring if she can get a Pleurx catheter placement. She is currently in a wheelchair. She has multiple comorbidities.  REVIEW OF SYSTEMS:   Constitutional: Denies fevers, chills or abnormal weight loss Eyes: Denies  blurriness of vision Ears, nose, mouth, throat, and face: Denies mucositis or sore throat Respiratory: Worsening shortness of breath Cardiovascular: Shortness of breath with minimal exertion Gastrointestinal:  Denies nausea, heartburn or change in bowel habits Skin: Bruises Neurological:Denies numbness, tingling or new weaknesses Behavioral/Psych: Mood is stable, no new changes  Extremities: 1+ lower extremity edema All other systems were reviewed with the patient and are negative.  I have reviewed the past medical history, past surgical history, social history and family history with the patient and they are unchanged from previous note.  ALLERGIES:  is allergic to penicillins; codeine; shellfish allergy; and sulfonamide derivatives.  MEDICATIONS:  Current Outpatient Prescriptions  Medication Sig Dispense Refill  . allopurinol (ZYLOPRIM) 300 MG tablet Take 300 mg by mouth daily.    Marland Kitchen alum hydroxide-mag trisilicate (GAVISCON) 25-05 MG CHEW chewable tablet Chew 1 tablet by mouth 4 (four) times daily as needed for indigestion or heartburn.    Marland Kitchen amiodarone (PACERONE) 200 MG tablet Take 1 tablet (200 mg total) by mouth daily. 30 tablet 4  . amoxicillin-clavulanate (AUGMENTIN) 875-125 MG tablet Take 1 tablet by mouth every 12 (twelve) hours. 10 tablet 0  . anastrozole (ARIMIDEX) 1 MG tablet Take 1 tablet (1 mg total) by mouth daily. 90 tablet 3  . bismuth subsalicylate (PEPTO BISMOL) 262 MG/15ML suspension Take 30 mLs by mouth every 6 (six) hours as needed.    . diltiazem (CARDIZEM CD) 180 MG 24 hr capsule TAKE ONE CAPSULE BY MOUTH ONCE DAILY    . diphenhydramine-acetaminophen (TYLENOL PM) 25-500 MG TABS tablet Take 1 tablet by mouth at bedtime as needed.    Marland Kitchen  furosemide (LASIX) 80 MG tablet Take 80 mg by mouth daily.    Marland Kitchen HYDROcodone-acetaminophen (NORCO) 10-325 MG per tablet Take 1 tablet by mouth every 6 (six) hours as needed for severe pain. 20 tablet 0  . levothyroxine (SYNTHROID,  LEVOTHROID) 25 MCG tablet Take 25 mcg by mouth daily before breakfast.   5  . metoprolol (TOPROL-XL) 100 MG 24 hr tablet Take 100 mg by mouth daily.      . nitroGLYCERIN (NITROSTAT) 0.4 MG SL tablet Place 0.4 mg under the tongue every 5 (five) minutes as needed for chest pain (MAX 3 TABLETS).     Marland Kitchen omeprazole (PRILOSEC) 40 MG capsule Take 40 mg by mouth daily.    . Potassium Chloride ER 20 MEQ TBCR Take 40 mEq by mouth 2 (two) times daily. 120 tablet 0  . pravastatin (PRAVACHOL) 20 MG tablet Take 20 mg by mouth daily.   3  . warfarin (COUMADIN) 5 MG tablet Take 2.5-5 mg by mouth daily at 6 PM. TAKES '5MG'$  ON WED, THURS AND FRI ONLY TAKES 2.'5MG'$  ALL OTHER DAYS     No current facility-administered medications for this visit.    PHYSICAL EXAMINATION: ECOG PERFORMANCE STATUS: 1 - Symptomatic but completely ambulatory  Filed Vitals:   05/21/16 1002  BP: 106/65  Pulse: 85  Temp: 98.7 F (37.1 C)  Resp: 19   Filed Weights   05/21/16 1002  Weight: 236 lb 9.6 oz (107.321 kg)    GENERAL:alert, no distress and comfortable SKIN: skin color, texture, turgor are normal, no rashes or significant lesions EYES: normal, Conjunctiva are pink and non-injected, sclera clear OROPHARYNX:no exudate, no erythema and lips, buccal mucosa, and tongue normal  NECK: supple, thyroid normal size, non-tender, without nodularity LYMPH:  no palpable lymphadenopathy in the cervical, axillary or inguinal LUNGS: Diminished breath sounds at lung bases HEART: regular rate & rhythm and no murmurs and no lower extremity edema ABDOMEN:abdomen soft, non-tender and normal bowel sounds MUSCULOSKELETAL:no cyanosis of digits and no clubbing  NEURO: alert & oriented x 3 with fluent speech, no focal motor/sensory deficits EXTREMITIES: No lower extremity edema  LABORATORY DATA:  I have reviewed the data as listed   Chemistry      Component Value Date/Time   NA 135 05/11/2016 0301   K 3.6 05/11/2016 0301   CL 98*  05/11/2016 0301   CO2 29 05/11/2016 0301   BUN 18 05/11/2016 0301   CREATININE 1.08* 05/11/2016 0301      Component Value Date/Time   CALCIUM 8.9 05/11/2016 0301   ALKPHOS 76 05/08/2016 1310   AST 29 05/08/2016 1310   ALT 21 05/08/2016 1310   BILITOT 0.9 05/08/2016 1310       Lab Results  Component Value Date   WBC 15.9* 05/11/2016   HGB 9.6* 05/11/2016   HCT 31.4* 05/11/2016   MCV 89.2 05/11/2016   PLT 312 05/11/2016   NEUTROABS 11.7* 05/08/2016     ASSESSMENT & PLAN:  Malignant pleural effusion Large right pleural effusion with mediastinal lymphadenopathy 3.3 cm, subcarinal lymph node 3 cm prevascular node 1.3 cm in addition to a pulmonary mass measuring 2.2 x 1.5 cm. detected 05/09/2016  Thoracentesis 05/10/2016: Atypical cells that were TTF-1 positive and faintly positive for MOC 31, malignancy cannot be entirely ruled out  Pathology review: I discussed with the patient that it appears that this most likely could be lung cancer. It would be considered stage IVA based upon the pleural effusion. We do not yet know if  there is any distant metastatic disease. In order to accurately diagnose, we will need more definite pathologic diagnosis and might require [place] to perform mediastinoscopy for a biopsy of the mediastinal lymph nodes.  Treatment plan: 1. Recurrent pleural effusion: Patient is symptomatic and would need another thoracentesis. I will request also for a Pleurx catheter placement 2. PET/CT scan will be performed 3. I would like the patient to see Dr. Roxan Hockey to discuss getting tissue for biopsy from the mediastinal lymphadenopathy or the pulmonary mass.  We had discussions regarding how to approach treatment of lung cancer if it was confirmed to be stage IV disease. Patient would be interested in treatment options if she has a mutation in either EGFR or ALK pathways. She would not be interested in undergoing chemotherapy. We should also look for PD L1 to see if she  would benefit from immunotherapy.  Return to clinic in 2 weeks to discuss all potential treatment options and plans with the patient.   Orders Placed This Encounter  Procedures  . DG Chest 2 View    Standing Status: Future     Number of Occurrences:      Standing Expiration Date: 05/21/2017    Order Specific Question:  Reason for Exam (SYMPTOM  OR DIAGNOSIS REQUIRED)    Answer:  Recurrent malignant pleural effusion    Order Specific Question:  Preferred imaging location?    Answer:  [facility] Sk[facility] To Th[facility]    Standing Status: Future     Number of Occurrences:      Standing Expiration Date: 05/21/2017    Order Specific Question:  Reason for Exam (SYMPTOM  OR DIAGNOSIS REQUIRED)    Answer:  Stage IV lung cancer    Order Specific Question:  Preferred imaging location?    Answer:  [facility]    Order Specific Question:  If indicated for the ordered procedure, I authorize the administration of a radiopharmaceutical per Radiology protocol    Answer:  Yes   The patient has a good understanding of the overall plan. she agrees with it. she will call with any problems that may develop before the next visit here.   Rulon Eisenmenger, MD 05/21/2016

## 2016-05-21 NOTE — Telephone Encounter (Signed)
appt made and avs printed °

## 2016-05-22 ENCOUNTER — Telehealth: Payer: Self-pay | Admitting: Internal Medicine

## 2016-05-22 NOTE — Telephone Encounter (Signed)
New message    Pt called states that she has stage for cancer. Pt states that she would like to send transmissions verses coming in for the appt at this time. Pleas assist

## 2016-05-22 NOTE — Telephone Encounter (Signed)
Obtained recommendations from Dr. Lovena Le that patient may check her device via remote transmission every 3 months rather than coming into the office.  Recall deleted.  Patient made aware and is agreeable to sending a remote transmission on 06/18/16.  Patient is appreciative of assistance and denies additional questions or concerns at this time.

## 2016-05-22 NOTE — Telephone Encounter (Signed)
Will review with Dr. Lovena Le and call patient back with recommendations.

## 2016-05-24 ENCOUNTER — Encounter: Payer: Self-pay | Admitting: Thoracic Surgery (Cardiothoracic Vascular Surgery)

## 2016-05-24 ENCOUNTER — Ambulatory Visit (INDEPENDENT_AMBULATORY_CARE_PROVIDER_SITE_OTHER): Payer: Medicare Other | Admitting: Thoracic Surgery (Cardiothoracic Vascular Surgery)

## 2016-05-24 ENCOUNTER — Other Ambulatory Visit: Payer: Self-pay | Admitting: Thoracic Surgery (Cardiothoracic Vascular Surgery)

## 2016-05-24 ENCOUNTER — Other Ambulatory Visit: Payer: Self-pay | Admitting: *Deleted

## 2016-05-24 VITALS — BP 114/64 | HR 79 | Resp 20 | Ht 65.75 in | Wt 234.0 lb

## 2016-05-24 DIAGNOSIS — R918 Other nonspecific abnormal finding of lung field: Secondary | ICD-10-CM

## 2016-05-24 DIAGNOSIS — R599 Enlarged lymph nodes, unspecified: Secondary | ICD-10-CM

## 2016-05-24 DIAGNOSIS — J948 Other specified pleural conditions: Secondary | ICD-10-CM

## 2016-05-24 DIAGNOSIS — R590 Localized enlarged lymph nodes: Secondary | ICD-10-CM

## 2016-05-24 DIAGNOSIS — I482 Chronic atrial fibrillation, unspecified: Secondary | ICD-10-CM

## 2016-05-24 DIAGNOSIS — J90 Pleural effusion, not elsewhere classified: Secondary | ICD-10-CM

## 2016-05-24 LAB — PROTIME-INR
INR: 3.2 — ABNORMAL HIGH
Prothrombin Time: 32.4 s — ABNORMAL HIGH (ref 9.0–11.5)

## 2016-05-24 NOTE — Progress Notes (Signed)
PCP is Raelene Bott, MD Referring Provider is Nicholas Lose, MD  Chief Complaint  Patient presents with  . Lung Mass    CT CHEST 05/08/16.Marland KitchenMarland KitchenCXR 05/21/16  . Adenopathy    mediastinal  . Pleural Effusion    right...THORACENTESIS 05/10/16 WITH 1.9 L    HPI: 82 year old woman with a history of breast cancer sent for consultation regarding a right pleural effusion.  Traci [name] is a 82-year-old woman with a history of right breast DCIS, paroxysmal atrial fibrillation, type 2 diabetes, obesity, obstructive sleep apnea, chronic diastolic congestive heart failure, remote history of DVT, biventricular pacemaker, and COPD. She is on warfarin for her atrial fibrillation.  She presented in late May with shortness of breath for 2 weeks. Workup revealed a large right pleural effusion. She had a thoracentesis and 1.9 L of fluid was drained. Cytology showed atypical cells that were TTF-1 positive and faintly positive for MOC 31. She improved symptomatically for a few days after drainage, but her symptoms rapidly recurred.  Her CT of the chest also showed a lung nodule and markedly enlarged mediastinal lymph nodes.  She currently is short of breath with even minimal activity. She is on home oxygen. She has noticed some swelling in her legs. She is not having any chest pain.  Zubrod Score: At the time of surgery this patient's most appropriate activity status/level should be described as: '[]'$     0    Normal activity, no symptoms '[]'$     1    Restricted in physical strenuous activity but ambulatory, able to do out light work '[]'$     2    Ambulatory and capable of self care, unable to do work activities, up and about >50 % of waking hours                              '[x]'$     3    Only limited self care, in bed greater than 50% of waking hours '[]'$     4    Completely disabled, no self care, confined to bed or chair '[]'$     5    Moribund    Past Medical History  Diagnosis Date  . DOE (dyspnea on exertion)   . COPD  (chronic obstructive pulmonary disease) ([place])   . Anxiety   . CHF (congestive heart failure) ([place])   . Diverticulitis of colon   . Paroxysmal atrial fibrillation ([facility])   . HTN (hypertension)   . Obesity   . Hyperlipidemia   . Full dentures   . Wears glasses   . GERD (gastroesophageal reflux disease)   . History of DVT (deep vein thrombosis) 1960s    BLE  . Presence of permanent cardiac pacemaker   . Hypothyroidism   . Hypokalemia 03/05/2016  . Sick sinus syndrome ([place]) 03/05/16    MDT PPM gen change Dr. Lovena Le  . Pleural effusion, right 05/08/2016    w/lymphadenopathy   . Cancer of right breast ([place])   . Kidney stones     "passed it"  . Type II diabetes mellitus ([place])   . Pneumonia 1990s  . OSA (obstructive sleep apnea)     has a cpap-cannot use (05/08/2016)  . Arthritis     "back, neck, toes" (05/08/2016)  . Chronic cervical pain   . Chronic lower back pain   . History of gout   . Depression     Past Surgical History  Procedure Laterality Date  .  Laminotomy / excision disk posterior cervical spine  1992  . Posterior laminectomy / decompression lumbar spine  1980; 1981    L4-5  . Esophagogastroduodenoscopy    . Dilation and curettage of uterus    . Colonoscopy    . Breast lumpectomy with radioactive seed localization Right 02/08/2015    Procedure: BREAST LUMPECTOMY WITH RADIOACTIVE SEED LOCALIZATION;  Surgeon: Stark Klein, MD;  Location: [place];  Service: General;  Laterality: Right;  . Re-excision of breast lumpectomy Right 02/14/2015    Procedure: RE-EXCISION OF RIGHT BREAST LUMPECTOMY;  Surgeon: Stark Klein, MD;  Location: [facility];  Service: General;  Laterality: Right;  . Back surgery    . Ep implantable device N/A 03/05/2016    MDT PPM gen change  . Insert / replace / remove pacemaker  2007  . Laparoscopic cholecystectomy    . Breast biopsy Right   . Vaginal hysterectomy    . Cardiac catheterization  2005    Family History   Problem Relation Age of Onset  . Heart failure Mother   . Pancreatic cancer Father 31  . Vaginal cancer Sister 44  . Hodgkin's lymphoma Sister 1  . Lung cancer Sister 24    Social History Social History  Substance Use Topics  . Smoking status: Former Smoker -- 1.00 packs/day for 50 years    Types: Cigarettes    Quit date: 12/10/1996  . Smokeless tobacco: Never Used  . Alcohol Use: No    Current Outpatient Prescriptions  Medication Sig Dispense Refill  . allopurinol (ZYLOPRIM) 300 MG tablet Take 300 mg by mouth daily.    Marland Kitchen alum hydroxide-mag trisilicate (GAVISCON) 85-27 MG CHEW chewable tablet Chew 1 tablet by mouth 4 (four) times daily as needed for indigestion or heartburn.    Marland Kitchen amiodarone (PACERONE) 200 MG tablet Take 1 tablet (200 mg total) by mouth daily. 30 tablet 4  . amoxicillin-clavulanate (AUGMENTIN) 875-125 MG tablet Take 1 tablet by mouth every 12 (twelve) hours. 10 tablet 0  . anastrozole (ARIMIDEX) 1 MG tablet Take 1 tablet (1 mg total) by mouth daily. 90 tablet 3  . bismuth subsalicylate (PEPTO BISMOL) 262 MG/15ML suspension Take 30 mLs by mouth every 6 (six) hours as needed.    . diltiazem (CARDIZEM CD) 180 MG 24 hr capsule TAKE ONE CAPSULE BY MOUTH ONCE DAILY    . diphenhydramine-acetaminophen (TYLENOL PM) 25-500 MG TABS tablet Take 1 tablet by mouth at bedtime as needed.    . furosemide (LASIX) 80 MG tablet Take 80 mg by mouth daily.    Marland Kitchen HYDROcodone-acetaminophen (NORCO) 10-325 MG per tablet Take 1 tablet by mouth every 6 (six) hours as needed for severe pain. 20 tablet 0  . levothyroxine (SYNTHROID, LEVOTHROID) 25 MCG tablet Take 25 mcg by mouth daily before breakfast.   5  . metoprolol (TOPROL-XL) 100 MG 24 hr tablet Take 100 mg by mouth daily.      . nitroGLYCERIN (NITROSTAT) 0.4 MG SL tablet Place 0.4 mg under the tongue every 5 (five) minutes as needed for chest pain (MAX 3 TABLETS).     Marland Kitchen omeprazole (PRILOSEC) 40 MG capsule Take 40 mg by mouth daily.    .  Potassium Chloride ER 20 MEQ TBCR Take 40 mEq by mouth 2 (two) times daily. 120 tablet 0  . pravastatin (PRAVACHOL) 20 MG tablet Take 20 mg by mouth daily.   3  . warfarin (COUMADIN) 5 MG tablet Take 2.5-5 mg by mouth daily at  6 PM. TAKES '5MG'$  ON WED, THURS AND FRI ONLY TAKES 2.'5MG'$  ALL OTHER DAYS     No current facility-administered medications for this visit.    Allergies  Allergen Reactions  . Penicillins Swelling and Other (See Comments)    "Felt like brain was swelling out of her head" - reaction was with PCN shot. Pt has tolerated cephalosporins outpatient.   . Codeine Other (See Comments)    Stomach issues  . Shellfish Allergy Hives and Itching  . Sulfonamide Derivatives Other (See Comments)    Immune to this    Review of Systems  Constitutional: Positive for activity change and appetite change. Negative for fever, chills and unexpected weight change.  HENT: Positive for hearing loss and trouble swallowing. Negative for voice change.   Respiratory: Positive for cough, shortness of breath and wheezing.   Cardiovascular: Positive for leg swelling. Negative for chest pain.  Gastrointestinal: Positive for abdominal pain (Reflux) and constipation. Negative for blood in stool.  Genitourinary: Positive for frequency. Negative for dysuria and hematuria.  Musculoskeletal: Positive for back pain and joint swelling.  Neurological: Positive for light-headedness, numbness (feet) and headaches. Negative for seizures and syncope.  Hematological: Negative for adenopathy. Bruises/bleeds easily (On warfarin).  Psychiatric/Behavioral: The patient is nervous/anxious.   All other systems reviewed and are negative.   BP 114/64 mmHg  Pulse 79  Resp 20  Ht 5' 5.75" (1.67 m)  Wt 234 lb (106.142 kg)  BMI 38.06 kg/m2  SpO2 95% Physical Exam  Constitutional: She is oriented to person, place, and time. No distress.  Morbidly obese woman in wheelchair  HENT:  Head: Normocephalic and atraumatic.   Eyes: Conjunctivae and EOM are normal. Pupils are equal, round, and reactive to light. No scleral icterus.  Neck: Neck supple. No JVD present.  Cardiovascular: Normal rate and regular rhythm.   Murmur heard. Pulmonary/Chest: She has wheezes.  Absent breath sounds right base  Abdominal: Soft. She exhibits no distension. There is no tenderness.  Musculoskeletal: She exhibits edema (2+ bilaterally).  Lymphadenopathy:    She has no cervical adenopathy.  Neurological: She is alert and oriented to person, place, and time. No cranial nerve deficit.  Motor grossly intact  Skin: Skin is warm and dry.  Vitals reviewed.    Diagnostic Tests:  CT CHEST WITH CONTRAST  TECHNIQUE: Multidetector CT imaging of the chest was performed during intravenous contrast administration.  CONTRAST: 34m ISOVUE-300 IOPAMIDOL (ISOVUE-300) INJECTION 61%  COMPARISON: Chest CT dated 05/03/2016.  FINDINGS: Mediastinum/Lymph Nodes: Again noted is the multiple large lymph nodes within the mediastinum. Largest lymph node is again seen within the right lower peritracheal space measuring 3.3 cm short axis dimension. Additional confluent lymphadenopathy is again seen within the sub- carinal space measuring approximately 3 cm short axis dimension. An additional abnormal lymph node in the prevascular space measures 1.3 cm short axis dimension. Several other smaller lymph nodes are noted.  There is an irregular mass along the right lateral margin of the upper mediastinum, measuring 2.2 x 1.5 cm (series 201, image 16) which could be either a mediastinal mass or parenchymal mass.  Heart size is normal. Trace pericardial effusion again appreciated. Scattered atherosclerotic changes are seen along the walls of the normal-caliber thoracic aorta. No aortic aneurysm or dissection.  Both thyroid lobes appear enlarged and slightly heterogeneous. A 1.2 cm nodule in the left thyroid lobe was better seen on the  earlier chest CT.  Lungs/Pleura: The moderate-to-large right pleural effusion appears stable in size and extent,  at least partially loculated, with adjacent compressive atelectasis which also appears stable in extent. No new lung findings. Milder atelectasis noted at the left lung base. Small emphysematous blebs noted at each lung apex.  Upper abdomen: Limited images of the upper abdomen are unremarkable. Again noted is a left renal cyst, incompletely imaged. Again noted is the nodule within the left adrenal gland measuring 2.1 x 1.3 cm.  Musculoskeletal: Degenerative changes are seen throughout the thoracolumbar spine. No acute or suspicious osseous lesion. Superficial soft tissues are unremarkable.  IMPRESSION: 1. Right pleural effusion, moderate to large in size, stable compared to earlier chest CT of 05/03/2016. The pleural effusion again appears to be at least partially loculated. The adjacent compressive atelectasis is stable in extent. No evidence of pneumonia seen. 2. Milder atelectasis at the left lung base. Left lung otherwise clear. 3. Extensive mediastinal lymphadenopathy. As described on the earlier CT report, the large size of the mediastinal lymph nodes suggests metastatic disease or lymphoma. 4. Irregular nodule/mass bordering the right lateral margin of the upper mediastinum, measuring 2.2 x 1.5 cm, additional mediastinal mass versus neoplastic pulmonary mass. 5. Trace pericardial effusion, stable. Heart size is normal. 6. Heterogeneously enlarged thyroid. 7. Left adrenal mass, measuring 2.1 cm, adenoma versus metastasis.   Electronically Signed  By: Franki Cabot M.D.  On: 05/08/2016 18:57  I personally reviewed the CT chest and concur with the findings as noted above  Impression: 82 year old woman with a probable malignant right pleural effusion. 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 However, I suspect  this is a new primary lung cancer. It appears to be stage IV disease with a malignant pleural effusion. Her cytology was suspicious but not diagnostic of malignancy. I spoke with Dr. Lindi Adie who would like a biopsy with enough tissue for molecular testing to see if she would be a candidate for targeted therapy.  She has a symptomatic effusion. She did improve for a few days after the effusion was drained previously. I think she would respond well to a pleural catheter to keep the fluid drained.  Unfortunately she still taking her Coumadin. Her INR is was only 1.38 on 05/11/2016, but was therapeutic only 4 days before that. I'm not comfortable trying to do a thoracentesis on her in the office today. Unfortunately I will be out of town tomorrow. We will check an INR on her and if its less than 2 we'll see if we can get IR to do a temporizing thoracentesis prior to definitive treatment.  I recommended to Traci [name] and her family that we do a right VATS, pleural and/or lymph node biopsies, and pleural catheter placement for definitive diagnosis and palliative treatment. They understand the general nature of the procedure. This will be done in the operating room under general anesthesia. She will be in the hospital for a couple of days afterwards. I reviewed the indications, risks, benefits, and alternatives. She understands the risk include, but are not limited to death, MI, DVT, PE, bleeding, possible need for transfusion, stroke, infection, catheter malfunction, as well as the possibility of other unforeseeable complications.  I will check with Dr. Lovena Le to see if he has any concerns about her having general anesthesia. She does not have any history of coronary disease but does have a history of CHF and paroxysmal atrial fibrillation.  Hold Coumadin beginning today. She understands there is a risk of stroke in the interim however it is fairly small.  Plan:  Hold Coumadin  Check INR, if less than 2  we'll ask IR to reduce ultrasound-guided thoracentesis tomorrow  Right VATS, pleural or lymph node biopsies, pleural catheter placement on Wednesday, 05/30/2016.  Melrose Nakayama, MD [facility] [phone]

## 2016-05-25 ENCOUNTER — Ambulatory Visit (HOSPITAL_COMMUNITY)
Admission: RE | Admit: 2016-05-25 | Discharge: 2016-05-25 | Disposition: A | Discharge status: Home / Self Care | Payer: Medicare Other | Source: Ambulatory Visit | Attending: Thoracic Surgery (Cardiothoracic Vascular Surgery) | Admitting: Thoracic Surgery (Cardiothoracic Vascular Surgery)

## 2016-05-25 ENCOUNTER — Other Ambulatory Visit: Payer: Self-pay | Admitting: *Deleted

## 2016-05-25 ENCOUNTER — Ambulatory Visit (HOSPITAL_COMMUNITY)
Admission: RE | Admit: 2016-05-25 | Discharge: 2016-05-25 | Disposition: A | Discharge status: Home / Self Care | Payer: Medicare Other | Source: Ambulatory Visit | Attending: Physician Assistant | Admitting: Physician Assistant

## 2016-05-25 DIAGNOSIS — Z9889 Other specified postprocedural states: Secondary | ICD-10-CM | POA: Diagnosis not present

## 2016-05-25 DIAGNOSIS — J90 Pleural effusion, not elsewhere classified: Secondary | ICD-10-CM | POA: Diagnosis not present

## 2016-05-25 DIAGNOSIS — R918 Other nonspecific abnormal finding of lung field: Secondary | ICD-10-CM | POA: Insufficient documentation

## 2016-05-25 MED ORDER — LIDOCAINE HCL (PF) 1 % IJ SOLN
INTRAMUSCULAR | Status: AC
  Filled 2016-05-25: qty 10

## 2016-05-25 NOTE — Procedures (Signed)
Successful US guided right thoracentesis. Yielded 2 liters of bloody fluid. Pt tolerated procedure well. No immediate complications.  Specimen was sent for labs. CXR ordered.  Daltyn Degroat S Jalina Blowers PA-C 05/25/2016 10:29 AM

## 2016-05-29 ENCOUNTER — Ambulatory Visit (HOSPITAL_COMMUNITY)
Admission: RE | Admit: 2016-05-29 | Discharge: 2016-05-29 | Disposition: A | Discharge status: Home / Self Care | Payer: Medicare Other | Source: Ambulatory Visit | Attending: Thoracic Surgery (Cardiothoracic Vascular Surgery) | Admitting: Thoracic Surgery (Cardiothoracic Vascular Surgery)

## 2016-05-29 ENCOUNTER — Encounter (HOSPITAL_COMMUNITY)
Admission: RE | Admit: 2016-05-29 | Discharge: 2016-05-29 | Disposition: A | Discharge status: Home / Self Care | Payer: Medicare Other | Source: Ambulatory Visit | Attending: Thoracic Surgery (Cardiothoracic Vascular Surgery) | Admitting: Thoracic Surgery (Cardiothoracic Vascular Surgery)

## 2016-05-29 ENCOUNTER — Encounter (HOSPITAL_COMMUNITY): Payer: Self-pay

## 2016-05-29 VITALS — BP 117/58 | HR 69 | Temp 98.0°F | Resp 20 | Ht 65.25 in | Wt 231.5 lb

## 2016-05-29 DIAGNOSIS — J90 Pleural effusion, not elsewhere classified: Secondary | ICD-10-CM

## 2016-05-29 DIAGNOSIS — I509 Heart failure, unspecified: Secondary | ICD-10-CM

## 2016-05-29 DIAGNOSIS — I700 Atherosclerosis of aorta: Secondary | ICD-10-CM | POA: Insufficient documentation

## 2016-05-29 DIAGNOSIS — Z01812 Encounter for preprocedural laboratory examination: Secondary | ICD-10-CM | POA: Insufficient documentation

## 2016-05-29 DIAGNOSIS — E119 Type 2 diabetes mellitus without complications: Secondary | ICD-10-CM

## 2016-05-29 DIAGNOSIS — K219 Gastro-esophageal reflux disease without esophagitis: Secondary | ICD-10-CM

## 2016-05-29 DIAGNOSIS — Z01818 Encounter for other preprocedural examination: Secondary | ICD-10-CM | POA: Insufficient documentation

## 2016-05-29 DIAGNOSIS — Z87891 Personal history of nicotine dependence: Secondary | ICD-10-CM | POA: Insufficient documentation

## 2016-05-29 DIAGNOSIS — I110 Hypertensive heart disease with heart failure: Secondary | ICD-10-CM | POA: Insufficient documentation

## 2016-05-29 DIAGNOSIS — G4733 Obstructive sleep apnea (adult) (pediatric): Secondary | ICD-10-CM

## 2016-05-29 HISTORY — DX: Unspecified cataract: H26.9

## 2016-05-29 LAB — COMPREHENSIVE METABOLIC PANEL
ALK PHOS: 72 U/L (ref 38–126)
ALT: 20 U/L (ref 14–54)
AST: 22 U/L (ref 15–41)
Albumin: 2.8 g/dL — ABNORMAL LOW (ref 3.5–5.0)
Anion gap: 9 (ref 5–15)
BILIRUBIN TOTAL: 0.5 mg/dL (ref 0.3–1.2)
BUN: 13 mg/dL (ref 6–20)
CALCIUM: 9 mg/dL (ref 8.9–10.3)
CO2: 26 mmol/L (ref 22–32)
CREATININE: 0.89 mg/dL (ref 0.44–1.00)
Chloride: 100 mmol/L — ABNORMAL LOW (ref 101–111)
GFR, EST NON AFRICAN AMERICAN: 59 mL/min — AB (ref >60)
Glucose, Bld: 104 mg/dL — ABNORMAL HIGH (ref 65–99)
Potassium: 4.8 mmol/L (ref 3.5–5.1)
Sodium: 135 mmol/L (ref 135–145)
Total Protein: 6 g/dL — ABNORMAL LOW (ref 6.5–8.1)

## 2016-05-29 LAB — BLOOD GAS, ARTERIAL
Acid-Base Excess: 3.4 mmol/L — ABNORMAL HIGH (ref 0.0–2.0)
Bicarbonate: 27.5 mEq/L — ABNORMAL HIGH (ref 20.0–24.0)
DRAWN BY: 421801
O2 CONTENT: 2 L/min
O2 Saturation: 97 %
PCO2 ART: 42.3 mmHg (ref 35.0–45.0)
PH ART: 7.428 (ref 7.350–7.450)
Patient temperature: 98.6
TCO2: 28.8 mmol/L (ref 0–100)
pO2, Arterial: 90.8 mmHg (ref 80.0–100.0)

## 2016-05-29 LAB — CBC
HEMATOCRIT: 29.7 % — AB (ref 36.0–46.0)
HEMOGLOBIN: 8.9 g/dL — AB (ref 12.0–15.0)
MCH: 26.1 pg (ref 26.0–34.0)
MCHC: 30 g/dL (ref 30.0–36.0)
MCV: 87.1 fL (ref 78.0–100.0)
Platelets: 444 10*3/uL — ABNORMAL HIGH (ref 150–400)
RBC: 3.41 MIL/uL — AB (ref 3.87–5.11)
RDW: 15.3 % (ref 11.5–15.5)
WBC: 22 10*3/uL — AB (ref 4.0–10.5)

## 2016-05-29 LAB — URINE MICROSCOPIC-ADD ON: RBC / HPF: NONE SEEN RBC/hpf (ref 0–5)

## 2016-05-29 LAB — TYPE AND SCREEN
ABO/RH(D): A POS
Antibody Screen: NEGATIVE

## 2016-05-29 LAB — APTT: aPTT: 33 seconds (ref 24–37)

## 2016-05-29 LAB — URINALYSIS, ROUTINE W REFLEX MICROSCOPIC
Bilirubin Urine: NEGATIVE
GLUCOSE, UA: NEGATIVE mg/dL
Hgb urine dipstick: NEGATIVE
KETONES UR: NEGATIVE mg/dL
NITRITE: POSITIVE — AB
PH: 6 (ref 5.0–8.0)
PROTEIN: NEGATIVE mg/dL
Specific Gravity, Urine: 1.02 (ref 1.005–1.030)

## 2016-05-29 LAB — SURGICAL PCR SCREEN
MRSA, PCR: NEGATIVE
Staphylococcus aureus: NEGATIVE

## 2016-05-29 LAB — PROTIME-INR
INR: 1.42 (ref 0.00–1.49)
PROTHROMBIN TIME: 17.5 s — AB (ref 11.6–15.2)

## 2016-05-29 LAB — ABO/RH: ABO/RH(D): A POS

## 2016-05-29 LAB — GLUCOSE, CAPILLARY: Glucose-Capillary: 110 mg/dL — ABNORMAL HIGH (ref 65–99)

## 2016-05-29 MED ORDER — VANCOMYCIN HCL 10 G IV SOLR
1500.0000 mg | INTRAVENOUS | Status: AC
  Administered 2016-05-30: 1500 mg via INTRAVENOUS
  Filled 2016-05-29: qty 1500

## 2016-05-29 NOTE — Progress Notes (Signed)
   How to Manage Your Diabetes Before and After Surgery  Why is it important to control my blood sugar before and after surgery? . Improving blood sugar levels before and after surgery helps healing and can limit problems. . A way of improving blood sugar control is eating a healthy diet by: o  Eating less sugar and carbohydrates o  Increasing activity/exercise o  Talking with your doctor about reaching your blood sugar goals . High blood sugars (greater than 180 mg/dL) can raise your risk of infections and slow your recovery, so you will need to focus on controlling your diabetes during the weeks before surgery. . Make sure that the doctor who takes care of your diabetes knows about your planned surgery including the date and location.  How do I manage my blood sugar before surgery? . Check your blood sugar at least 4 times a day, starting 2 days before surgery, to make sure that the level is not too high or low. o Check your blood sugar the morning of your surgery when you wake up and every 2 hours until you get to the Short Stay unit. . If your blood sugar is less than 70 mg/dL, you will need to treat for low blood sugar: o Do not take insulin. o Treat a low blood sugar (less than 70 mg/dL) with  cup of clear juice (cranberry or apple), 4 glucose tablets, OR glucose gel. o Recheck blood sugar in 15 minutes after treatment (to make sure it is greater than 70 mg/dL). If your blood sugar is not greater than 70 mg/dL on recheck, call [phone] for further instructions. . Report your blood sugar to the short stay nurse when you get to Short Stay.  . If you are admitted to the hospital after surgery: o Your blood sugar will be checked by the staff and you will probably be given insulin after surgery (instead of oral diabetes medicines) to make sure you have good blood sugar levels. o The goal for blood sugar control after surgery is 80-180 mg/dL.              WHAT DO I DO ABOUT  MY DIABETES MEDICATION?   Marland Kitchen Do not take oral diabetes medicines (pills) the morning of surgery.  . THE NIGHT BEFORE SURGERY, take ___________ units of ___________insulin.       Marland Kitchen HE MORNING OF SURGERY, take _____________ units of __________insulin.  . The day of surgery, do not take other diabetes injectables, including Byetta (exenatide), Bydureon (exenatide ER), Victoza (liraglutide), or Trulicity (dulaglutide).  . If your CBG is greater than 220 mg/dL, you may take  of your sliding scale (correction) dose of insulin.  Other Instructions:          Patient Signature:  Date:   Nurse Signature:  Date:   Reviewed and Endorsed by [facility] Patient Education Committee, August 2015

## 2016-05-29 NOTE — Pre-Procedure Instructions (Signed)
    Traci [name]  05/29/2016      [facility] [address] [place], [place] - [address] [place] [address] Phone: [phone] Fax: [phone]    Your procedure is scheduled on 05-30-2016     Wednesday    Report to [facility] Admitting at 11:00 AM   Call this number if you have problems the morning of surgery:  [phone]   Remember:  Do not eat food or drink liquids after midnight.   Take these medicines the morning of surgery with A SIP OF WATER Tylenol if needed,allopurinol(Zyloprim),Amiodarone(Pacerone),anastrozole(Arimidex),diltiazem(Cardizem),pain medication if needed,metoprolol(Toprol XL),omeprazole(Prilosec),   Do not wear jewelry, make-up or nail polish.  Do not wear lotions, powders, or perfumes.  You may not wear deoderant.  Do not shave 48 hours prior to surgery.     Do not bring valuables to the hospital.  [facility] is not responsible for any belongings or valuables.  Contacts, dentures or bridgework may not be worn into surgery.  Leave your suitcase in the car.  After surgery it may be brought to your room.  For patients admitted to the hospital, discharge time will be determined by your treatment team.  Patients discharged the day of surgery will not be allowed to drive home.    Special instructions:  See attached Sheet for instructions on CHG showers  Please read over the following fact sheets that you were given. Coughing and Deep Breathing

## 2016-05-29 NOTE — Progress Notes (Signed)
Anesthesia Chart Review:  Pt is a 82 year old female scheduled for R chest video assisted thoracotomy, pleural biopsy, insertion of pleural drainage catheter on 05/30/2016 with Dr. Roxan Hockey.   PCP is Raelene Bott, MD. EP cardiologist is Cristopher Peru, MD.   PMH includes:  CHF, atrial fibrillation, sick sinus syndrome, pacemaker ([employer], generator change 02/29/16), HTN, DM, hypothyroidism, OSA, COPD, breast cancer, DVT, GERD. Using home oxygen. Former smoker. BMI 38. S/p breast lumpectomy 02/08/15, 02/14/15.   Medications include: amiodarone, arimidex, diltiazem, doxazosin, lasix, levothyroxine, metoprolol, prilosec, potassium, pravastatin, coumadin. Coumadin stopped 05/24/16.   Preoperative labs reviewed.   - PT is slightly high at 17.5. Ebony Hail in Dr. Leonarda Salon office notified.  - H/H  8.9/29.7 which is consistent with recent prior results. T&S already done.  - CMET is pending.   Chest x-ray 05/29/16 reviewed.  1. Reaccumulation of the moderate to large right pleural effusion since thoracentesis on 05/25/2016. 2. No new cardiopulmonary abnormality. 3. Aortic atherosclerosis.  EKG 05/08/16: Electronic atrial pacemaker with 1st degree AV block  Echo 03/03/16:  - Left ventricle: The cavity size was normal. Systolic function was normal. The estimated ejection fraction was in the range of 60% to 65%. Wall motion was normal; there were no regional wall motion abnormalities. - Pulmonary arteries: PA peak pressure: 34 mm Hg (S).  Perioperative pacemaker prescription form pending.   If labs acceptable, I anticipate pt can proceed as scheduled.   Willeen Cass, FNP-BC [facility] Stay [facility]/Anesthesiology Phone: [phone] 05/29/2016 4:39 PM

## 2016-05-29 NOTE — Progress Notes (Signed)
Spoke with Tomi Bamberger from [employer] to make her aware of Peri-op Prescription for ICD order.

## 2016-05-29 NOTE — Progress Notes (Signed)
Pt wears 2 liters of continuous oxygen via nasal cannula. Pt denies having a stress test and cardiac cath. Pt chart forwarded to anesthesia for review ( see note).

## 2016-05-30 ENCOUNTER — Encounter (HOSPITAL_COMMUNITY)
Admission: RE | Disposition: A | Discharge status: Home / Self Care | Payer: Self-pay | Source: Ambulatory Visit | Attending: Thoracic Surgery (Cardiothoracic Vascular Surgery)

## 2016-05-30 ENCOUNTER — Encounter (HOSPITAL_COMMUNITY): Payer: Self-pay | Admitting: *Deleted

## 2016-05-30 ENCOUNTER — Inpatient Hospital Stay (HOSPITAL_COMMUNITY)
Admission: RE | Admit: 2016-05-30 | Discharge: 2016-06-01 | DRG: 167 | Disposition: A | Discharge status: Home / Self Care | Payer: Medicare Other | Source: Ambulatory Visit | Attending: Thoracic Surgery (Cardiothoracic Vascular Surgery) | Admitting: Thoracic Surgery (Cardiothoracic Vascular Surgery)

## 2016-05-30 ENCOUNTER — Inpatient Hospital Stay (HOSPITAL_COMMUNITY): Payer: Medicare Other | Admitting: Certified Registered Nurse Anesthetist

## 2016-05-30 ENCOUNTER — Inpatient Hospital Stay (HOSPITAL_COMMUNITY): Payer: Medicare Other | Admitting: Vascular Surgery

## 2016-05-30 DIAGNOSIS — I480 Paroxysmal atrial fibrillation: Secondary | ICD-10-CM | POA: Diagnosis present

## 2016-05-30 DIAGNOSIS — Z7901 Long term (current) use of anticoagulants: Secondary | ICD-10-CM

## 2016-05-30 DIAGNOSIS — Z6838 Body mass index (BMI) 38.0-38.9, adult: Secondary | ICD-10-CM

## 2016-05-30 DIAGNOSIS — Z9981 Dependence on supplemental oxygen: Secondary | ICD-10-CM

## 2016-05-30 DIAGNOSIS — Z87891 Personal history of nicotine dependence: Secondary | ICD-10-CM

## 2016-05-30 DIAGNOSIS — J90 Pleural effusion, not elsewhere classified: Secondary | ICD-10-CM

## 2016-05-30 DIAGNOSIS — Z4682 Encounter for fitting and adjustment of non-vascular catheter: Secondary | ICD-10-CM

## 2016-05-30 DIAGNOSIS — Z853 Personal history of malignant neoplasm of breast: Secondary | ICD-10-CM

## 2016-05-30 DIAGNOSIS — Z09 Encounter for follow-up examination after completed treatment for conditions other than malignant neoplasm: Secondary | ICD-10-CM

## 2016-05-30 DIAGNOSIS — E785 Hyperlipidemia, unspecified: Secondary | ICD-10-CM | POA: Diagnosis present

## 2016-05-30 DIAGNOSIS — J9811 Atelectasis: Secondary | ICD-10-CM | POA: Diagnosis present

## 2016-05-30 DIAGNOSIS — D649 Anemia, unspecified: Secondary | ICD-10-CM | POA: Diagnosis present

## 2016-05-30 DIAGNOSIS — E662 Morbid (severe) obesity with alveolar hypoventilation: Secondary | ICD-10-CM | POA: Diagnosis present

## 2016-05-30 DIAGNOSIS — R911 Solitary pulmonary nodule: Secondary | ICD-10-CM | POA: Diagnosis present

## 2016-05-30 DIAGNOSIS — I110 Hypertensive heart disease with heart failure: Secondary | ICD-10-CM | POA: Diagnosis present

## 2016-05-30 DIAGNOSIS — I5032 Chronic diastolic (congestive) heart failure: Secondary | ICD-10-CM | POA: Diagnosis present

## 2016-05-30 DIAGNOSIS — Z950 Presence of cardiac pacemaker: Secondary | ICD-10-CM | POA: Diagnosis not present

## 2016-05-30 DIAGNOSIS — J910 Malignant pleural effusion: Secondary | ICD-10-CM | POA: Diagnosis present

## 2016-05-30 DIAGNOSIS — C3490 Malignant neoplasm of unspecified part of unspecified bronchus or lung: Secondary | ICD-10-CM | POA: Diagnosis present

## 2016-05-30 DIAGNOSIS — K219 Gastro-esophageal reflux disease without esophagitis: Secondary | ICD-10-CM | POA: Diagnosis present

## 2016-05-30 DIAGNOSIS — C799 Secondary malignant neoplasm of unspecified site: Secondary | ICD-10-CM

## 2016-05-30 DIAGNOSIS — E039 Hypothyroidism, unspecified: Secondary | ICD-10-CM | POA: Diagnosis present

## 2016-05-30 DIAGNOSIS — C3491 Malignant neoplasm of unspecified part of right bronchus or lung: Secondary | ICD-10-CM | POA: Diagnosis present

## 2016-05-30 DIAGNOSIS — E119 Type 2 diabetes mellitus without complications: Secondary | ICD-10-CM | POA: Diagnosis present

## 2016-05-30 HISTORY — PX: VIDEO ASSISTED THORACOSCOPY: SHX5073

## 2016-05-30 HISTORY — PX: CHEST TUBE INSERTION: SHX231

## 2016-05-30 HISTORY — PX: PLEURAL BIOPSY: SHX5082

## 2016-05-30 LAB — GLUCOSE, CAPILLARY
GLUCOSE-CAPILLARY: 110 mg/dL — AB (ref 65–99)
GLUCOSE-CAPILLARY: 165 mg/dL — AB (ref 65–99)
Glucose-Capillary: 119 mg/dL — ABNORMAL HIGH (ref 65–99)
Glucose-Capillary: 99 mg/dL (ref 65–99)

## 2016-05-30 SURGERY — VIDEO ASSISTED THORACOSCOPY
Anesthesia: General | Site: Chest | Laterality: Right

## 2016-05-30 MED ORDER — OXYCODONE HCL 5 MG PO TABS
5.0000 mg | ORAL_TABLET | ORAL | Status: DC | PRN
  Administered 2016-05-31 (×2): 5 mg via ORAL
  Administered 2016-06-01 (×2): 10 mg via ORAL
  Filled 2016-05-30: qty 2
  Filled 2016-05-30: qty 1
  Filled 2016-05-30: qty 2
  Filled 2016-05-30: qty 1

## 2016-05-30 MED ORDER — PANTOPRAZOLE SODIUM 40 MG PO TBEC
80.0000 mg | DELAYED_RELEASE_TABLET | Freq: Every day | ORAL | Status: DC
  Administered 2016-05-31 – 2016-06-01 (×2): 80 mg via ORAL
  Filled 2016-05-30 (×2): qty 2

## 2016-05-30 MED ORDER — PROPOFOL 10 MG/ML IV BOLUS
INTRAVENOUS | Status: DC | PRN
  Administered 2016-05-30: 160 mg via INTRAVENOUS

## 2016-05-30 MED ORDER — DILTIAZEM HCL ER COATED BEADS 180 MG PO CP24
180.0000 mg | ORAL_CAPSULE | Freq: Every day | ORAL | Status: DC
  Administered 2016-05-31 – 2016-06-01 (×2): 180 mg via ORAL
  Filled 2016-05-30 (×2): qty 1

## 2016-05-30 MED ORDER — DOXAZOSIN MESYLATE 2 MG PO TABS
2.0000 mg | ORAL_TABLET | Freq: Every day | ORAL | Status: DC
  Administered 2016-05-31: 2 mg via ORAL
  Filled 2016-05-30: qty 1

## 2016-05-30 MED ORDER — LACTATED RINGERS IV SOLN: INTRAVENOUS | Status: DC | PRN

## 2016-05-30 MED ORDER — HYDROMORPHONE HCL 1 MG/ML IJ SOLN
INTRAMUSCULAR | Status: AC
  Filled 2016-05-30: qty 1

## 2016-05-30 MED ORDER — PROPOFOL 10 MG/ML IV BOLUS
INTRAVENOUS | Status: AC
  Filled 2016-05-30: qty 20

## 2016-05-30 MED ORDER — OXYCODONE HCL 5 MG/5ML PO SOLN: 5.0000 mg | Freq: Once | ORAL | Status: DC | PRN

## 2016-05-30 MED ORDER — WARFARIN SODIUM 5 MG PO TABS
2.5000 mg | ORAL_TABLET | ORAL | Status: DC
  Administered 2016-05-31: 2.5 mg via ORAL
  Filled 2016-05-30: qty 1

## 2016-05-30 MED ORDER — ALLOPURINOL 300 MG PO TABS
300.0000 mg | ORAL_TABLET | Freq: Every day | ORAL | Status: DC
  Administered 2016-05-31 – 2016-06-01 (×2): 300 mg via ORAL
  Filled 2016-05-30 (×2): qty 1

## 2016-05-30 MED ORDER — MIDAZOLAM HCL 2 MG/2ML IJ SOLN
INTRAMUSCULAR | Status: AC
  Filled 2016-05-30: qty 2

## 2016-05-30 MED ORDER — DIPHENHYDRAMINE HCL 12.5 MG/5ML PO ELIX: 12.5000 mg | ORAL_SOLUTION | Freq: Four times a day (QID) | ORAL | Status: DC | PRN

## 2016-05-30 MED ORDER — AMIODARONE HCL 200 MG PO TABS
200.0000 mg | ORAL_TABLET | Freq: Every day | ORAL | Status: DC
  Administered 2016-05-31 – 2016-06-01 (×2): 200 mg via ORAL
  Filled 2016-05-30 (×2): qty 1

## 2016-05-30 MED ORDER — LIDOCAINE 2% (20 MG/ML) 5 ML SYRINGE
INTRAMUSCULAR | Status: DC | PRN
  Administered 2016-05-30: 100 mg via INTRAVENOUS

## 2016-05-30 MED ORDER — NALOXONE HCL 0.4 MG/ML IJ SOLN: 0.4000 mg | INTRAMUSCULAR | Status: DC | PRN

## 2016-05-30 MED ORDER — DEXTROSE-NACL 5-0.9 % IV SOLN
INTRAVENOUS | Status: DC
  Administered 2016-05-30: 18:00:00 via INTRAVENOUS

## 2016-05-30 MED ORDER — FUROSEMIDE 80 MG PO TABS
80.0000 mg | ORAL_TABLET | Freq: Every day | ORAL | Status: DC
  Administered 2016-05-31 – 2016-06-01 (×3): 80 mg via ORAL
  Filled 2016-05-30 (×3): qty 1

## 2016-05-30 MED ORDER — MIDAZOLAM HCL 2 MG/2ML IJ SOLN
INTRAMUSCULAR | Status: DC | PRN
Start: 2016-05-30 — End: 2016-05-30
  Administered 2016-05-30 (×2): 1 mg via INTRAVENOUS

## 2016-05-30 MED ORDER — SODIUM CHLORIDE 0.9% FLUSH: 9.0000 mL | INTRAVENOUS | Status: DC | PRN

## 2016-05-30 MED ORDER — ALBUTEROL SULFATE (2.5 MG/3ML) 0.083% IN NEBU
2.5000 mg | INHALATION_SOLUTION | RESPIRATORY_TRACT | Status: DC
  Administered 2016-05-31 – 2016-06-01 (×6): 2.5 mg via RESPIRATORY_TRACT
  Filled 2016-05-30 (×5): qty 3

## 2016-05-30 MED ORDER — ONDANSETRON HCL 4 MG/2ML IJ SOLN
INTRAMUSCULAR | Status: DC | PRN
Start: 2016-05-30 — End: 2016-05-30
  Administered 2016-05-30: 4 mg via INTRAVENOUS

## 2016-05-30 MED ORDER — 0.9 % SODIUM CHLORIDE (POUR BTL) OPTIME
TOPICAL | Status: DC | PRN
  Administered 2016-05-30 (×2): 1000 mL

## 2016-05-30 MED ORDER — FENTANYL CITRATE (PF) 250 MCG/5ML IJ SOLN
INTRAMUSCULAR | Status: DC | PRN
  Administered 2016-05-30 (×3): 25 ug via INTRAVENOUS
  Administered 2016-05-30: 100 ug via INTRAVENOUS
  Administered 2016-05-30 (×2): 25 ug via INTRAVENOUS

## 2016-05-30 MED ORDER — HYDROMORPHONE HCL 1 MG/ML IJ SOLN
0.2500 mg | INTRAMUSCULAR | Status: DC | PRN
  Administered 2016-05-30 (×2): 0.5 mg via INTRAVENOUS

## 2016-05-30 MED ORDER — DEXAMETHASONE SODIUM PHOSPHATE 10 MG/ML IJ SOLN
INTRAMUSCULAR | Status: DC | PRN
  Administered 2016-05-30: 5 mg via INTRAVENOUS

## 2016-05-30 MED ORDER — OXYCODONE HCL 5 MG PO TABS: 5.0000 mg | ORAL_TABLET | Freq: Once | ORAL | Status: DC | PRN

## 2016-05-30 MED ORDER — ANASTROZOLE 1 MG PO TABS
1.0000 mg | ORAL_TABLET | Freq: Every day | ORAL | Status: DC
  Administered 2016-05-31 – 2016-06-01 (×2): 1 mg via ORAL
  Filled 2016-05-30 (×2): qty 1

## 2016-05-30 MED ORDER — ALBUTEROL SULFATE (2.5 MG/3ML) 0.083% IN NEBU
2.5000 mg | INHALATION_SOLUTION | RESPIRATORY_TRACT | Status: DC
  Administered 2016-05-30: 2.5 mg via RESPIRATORY_TRACT
  Filled 2016-05-30: qty 3

## 2016-05-30 MED ORDER — ONDANSETRON HCL 4 MG/2ML IJ SOLN: 4.0000 mg | Freq: Four times a day (QID) | INTRAMUSCULAR | Status: DC | PRN

## 2016-05-30 MED ORDER — FENTANYL 40 MCG/ML IV SOLN
INTRAVENOUS | Status: DC
  Administered 2016-05-30: 40 ug via INTRAVENOUS
  Administered 2016-05-30: 17:00:00 via INTRAVENOUS
  Administered 2016-05-31: 80 ug via INTRAVENOUS
  Administered 2016-05-31: 210 ug via INTRAVENOUS
  Administered 2016-05-31: 90 ug via INTRAVENOUS
  Administered 2016-05-31: 150 ug via INTRAVENOUS
  Administered 2016-05-31: 90 ug via INTRAVENOUS

## 2016-05-30 MED ORDER — LEVOTHYROXINE SODIUM 25 MCG PO TABS
25.0000 ug | ORAL_TABLET | Freq: Every day | ORAL | Status: DC
  Administered 2016-05-31: 25 ug via ORAL
  Filled 2016-05-30: qty 1

## 2016-05-30 MED ORDER — VANCOMYCIN HCL IN DEXTROSE 1-5 GM/200ML-% IV SOLN
1000.0000 mg | Freq: Two times a day (BID) | INTRAVENOUS | Status: AC
  Administered 2016-05-31: 1000 mg via INTRAVENOUS
  Filled 2016-05-30: qty 200

## 2016-05-30 MED ORDER — NITROGLYCERIN 0.4 MG SL SUBL: 0.4000 mg | SUBLINGUAL_TABLET | SUBLINGUAL | Status: DC | PRN

## 2016-05-30 MED ORDER — ACETAMINOPHEN 500 MG PO TABS
1000.0000 mg | ORAL_TABLET | Freq: Four times a day (QID) | ORAL | Status: DC
  Administered 2016-05-30 – 2016-06-01 (×6): 1000 mg via ORAL
  Filled 2016-05-30 (×6): qty 2

## 2016-05-30 MED ORDER — SUCCINYLCHOLINE CHLORIDE 200 MG/10ML IV SOSY
PREFILLED_SYRINGE | INTRAVENOUS | Status: DC | PRN
  Administered 2016-05-30: 100 mg via INTRAVENOUS

## 2016-05-30 MED ORDER — ROCURONIUM BROMIDE 100 MG/10ML IV SOLN
INTRAVENOUS | Status: DC | PRN
  Administered 2016-05-30: 5 mg via INTRAVENOUS
  Administered 2016-05-30: 45 mg via INTRAVENOUS

## 2016-05-30 MED ORDER — TRAMADOL HCL 50 MG PO TABS
50.0000 mg | ORAL_TABLET | Freq: Four times a day (QID) | ORAL | Status: DC | PRN
  Administered 2016-06-01: 100 mg via ORAL
  Filled 2016-05-30: qty 2

## 2016-05-30 MED ORDER — ALBUTEROL SULFATE (2.5 MG/3ML) 0.083% IN NEBU
2.5000 mg | INHALATION_SOLUTION | RESPIRATORY_TRACT | Status: DC | PRN
Start: 2016-05-30 — End: 2016-06-01
  Filled 2016-05-30: qty 3

## 2016-05-30 MED ORDER — POTASSIUM CHLORIDE ER 10 MEQ PO TBCR
40.0000 meq | EXTENDED_RELEASE_TABLET | Freq: Two times a day (BID) | ORAL | Status: DC
  Administered 2016-05-31 (×2): 40 meq via ORAL
  Filled 2016-05-30 (×4): qty 4

## 2016-05-30 MED ORDER — SENNOSIDES-DOCUSATE SODIUM 8.6-50 MG PO TABS
1.0000 | ORAL_TABLET | Freq: Every day | ORAL | Status: DC
  Administered 2016-05-30 – 2016-05-31 (×2): 1 via ORAL
  Filled 2016-05-30 (×2): qty 1

## 2016-05-30 MED ORDER — WARFARIN SODIUM 5 MG PO TABS: 2.5000 mg | ORAL_TABLET | Freq: Every day | ORAL | Status: DC

## 2016-05-30 MED ORDER — BISACODYL 5 MG PO TBEC
10.0000 mg | DELAYED_RELEASE_TABLET | Freq: Every day | ORAL | Status: DC
  Administered 2016-05-31 – 2016-06-01 (×2): 10 mg via ORAL
  Filled 2016-05-30 (×2): qty 2

## 2016-05-30 MED ORDER — PHENYLEPHRINE HCL 10 MG/ML IJ SOLN
10.0000 mg | INTRAVENOUS | Status: DC | PRN
  Administered 2016-05-30: 40 ug/min via INTRAVENOUS

## 2016-05-30 MED ORDER — ALUM HYDROXIDE-MAG TRISILICATE 80-20 MG PO CHEW: 1.0000 | CHEWABLE_TABLET | Freq: Four times a day (QID) | ORAL | Status: DC | PRN

## 2016-05-30 MED ORDER — PRAVASTATIN SODIUM 20 MG PO TABS
20.0000 mg | ORAL_TABLET | Freq: Every day | ORAL | Status: DC
  Administered 2016-05-31: 20 mg via ORAL
  Filled 2016-05-30: qty 1

## 2016-05-30 MED ORDER — WARFARIN SODIUM 5 MG PO TABS: 5.0000 mg | ORAL_TABLET | ORAL | Status: DC

## 2016-05-30 MED ORDER — ENOXAPARIN SODIUM 40 MG/0.4ML ~~LOC~~ SOLN
40.0000 mg | Freq: Every day | SUBCUTANEOUS | Status: DC
  Administered 2016-05-30 – 2016-06-01 (×3): 40 mg via SUBCUTANEOUS
  Filled 2016-05-30 (×3): qty 0.4

## 2016-05-30 MED ORDER — SUGAMMADEX SODIUM 200 MG/2ML IV SOLN
INTRAVENOUS | Status: DC | PRN
  Administered 2016-05-30: 200 mg via INTRAVENOUS

## 2016-05-30 MED ORDER — DIPHENHYDRAMINE HCL 50 MG/ML IJ SOLN: 12.5000 mg | Freq: Four times a day (QID) | INTRAMUSCULAR | Status: DC | PRN

## 2016-05-30 MED ORDER — POTASSIUM CHLORIDE 10 MEQ/50ML IV SOLN: 10.0000 meq | Freq: Every day | INTRAVENOUS | Status: DC | PRN

## 2016-05-30 MED ORDER — PHENYLEPHRINE HCL 10 MG/ML IJ SOLN
INTRAMUSCULAR | Status: DC | PRN
  Administered 2016-05-30 (×3): 80 ug via INTRAVENOUS
  Administered 2016-05-30: 40 ug via INTRAVENOUS

## 2016-05-30 MED ORDER — FENTANYL CITRATE (PF) 250 MCG/5ML IJ SOLN
INTRAMUSCULAR | Status: AC
  Filled 2016-05-30: qty 5

## 2016-05-30 MED ORDER — ACETAMINOPHEN 160 MG/5ML PO SOLN: 1000.0000 mg | Freq: Four times a day (QID) | ORAL | Status: DC

## 2016-05-30 MED ORDER — WARFARIN - PHYSICIAN DOSING INPATIENT
Freq: Every day | Status: DC
  Administered 2016-05-31: 18:00:00

## 2016-05-30 MED ORDER — FENTANYL 40 MCG/ML IV SOLN
INTRAVENOUS | Status: AC
  Filled 2016-05-30: qty 25

## 2016-05-30 MED ORDER — METOPROLOL SUCCINATE ER 50 MG PO TB24
100.0000 mg | ORAL_TABLET | Freq: Every day | ORAL | Status: DC
  Administered 2016-05-31 – 2016-06-01 (×2): 100 mg via ORAL
  Filled 2016-05-30 (×2): qty 2

## 2016-05-30 MED ORDER — ALBUTEROL SULFATE (2.5 MG/3ML) 0.083% IN NEBU: 2.5000 mg | INHALATION_SOLUTION | RESPIRATORY_TRACT | Status: DC

## 2016-05-30 MED ORDER — LACTATED RINGERS IV SOLN
INTRAVENOUS | Status: DC
  Administered 2016-05-30 (×2): via INTRAVENOUS

## 2016-05-30 MED ORDER — PROMETHAZINE HCL 25 MG/ML IJ SOLN: 6.2500 mg | INTRAMUSCULAR | Status: DC | PRN

## 2016-05-30 SURGICAL SUPPLY — 82 items
ADH SKN CLS APL DERMABOND .7 (GAUZE/BANDAGES/DRESSINGS) ×1
APPLIER CLIP ROT 10 11.4 M/L (STAPLE)
APR CLP MED LRG 11.4X10 (STAPLE)
BAG SPEC RTRVL LRG 6X4 10 (ENDOMECHANICALS)
CANISTER SUCTION 2500CC (MISCELLANEOUS) ×3 IMPLANT
CATH KIT ON Q 5IN SLV (PAIN MANAGEMENT) IMPLANT
CATH THORACIC 28FR (CATHETERS) IMPLANT
CATH THORACIC 28FR RT ANG (CATHETERS) IMPLANT
CATH THORACIC 36FR (CATHETERS) IMPLANT
CATH THORACIC 36FR RT ANG (CATHETERS) IMPLANT
CLIP APPLIE ROT 10 11.4 M/L (STAPLE) IMPLANT
CLIP TI MEDIUM 6 (CLIP) IMPLANT
CONN Y 3/8X3/8X3/8  BEN (MISCELLANEOUS) ×2
CONN Y 3/8X3/8X3/8 BEN (MISCELLANEOUS) ×1 IMPLANT
CONT SPEC 4OZ CLIKSEAL STRL BL (MISCELLANEOUS) ×6 IMPLANT
COVER SURGICAL LIGHT HANDLE (MISCELLANEOUS) ×3 IMPLANT
DERMABOND ADVANCED (GAUZE/BANDAGES/DRESSINGS) ×2
DERMABOND ADVANCED .7 DNX12 (GAUZE/BANDAGES/DRESSINGS) ×1 IMPLANT
DRAIN CHANNEL 28F RND 3/8 FF (WOUND CARE) IMPLANT
DRAIN CHANNEL 32F RND 10.7 FF (WOUND CARE) IMPLANT
DRAPE C-ARM 42X72 X-RAY (DRAPES) ×1 IMPLANT
DRAPE LAPAROSCOPIC ABDOMINAL (DRAPES) ×3 IMPLANT
DRAPE WARM FLUID 44X44 (DRAPE) ×3 IMPLANT
ELECT BLADE 6.5 EXT (BLADE) ×4 IMPLANT
ELECT REM PT RETURN 9FT ADLT (ELECTROSURGICAL) ×3
ELECTRODE REM PT RTRN 9FT ADLT (ELECTROSURGICAL) ×1 IMPLANT
GAUZE SPONGE 4X4 12PLY STRL (GAUZE/BANDAGES/DRESSINGS) ×3 IMPLANT
GLOVE SURG SIGNA 7.5 PF LTX (GLOVE) ×6 IMPLANT
GOWN STRL REUS W/ TWL LRG LVL3 (GOWN DISPOSABLE) ×2 IMPLANT
GOWN STRL REUS W/ TWL XL LVL3 (GOWN DISPOSABLE) ×1 IMPLANT
GOWN STRL REUS W/TWL LRG LVL3 (GOWN DISPOSABLE) ×6
GOWN STRL REUS W/TWL XL LVL3 (GOWN DISPOSABLE) ×3
HEMOSTAT SURGICEL 2X14 (HEMOSTASIS) IMPLANT
KIT BASIN OR (CUSTOM PROCEDURE TRAY) ×3 IMPLANT
KIT PLEURX DRAIN CATH 1000ML (MISCELLANEOUS) ×3 IMPLANT
KIT PLEURX DRAIN CATH 15.5FR (DRAIN) ×3 IMPLANT
KIT ROOM TURNOVER OR (KITS) ×3 IMPLANT
KIT SUCTION CATH 14FR (SUCTIONS) ×3 IMPLANT
NS IRRIG 1000ML POUR BTL (IV SOLUTION) ×6 IMPLANT
PACK CHEST (CUSTOM PROCEDURE TRAY) ×3 IMPLANT
PACK GENERAL/GYN (CUSTOM PROCEDURE TRAY) ×3 IMPLANT
PAD ARMBOARD 7.5X6 YLW CONV (MISCELLANEOUS) ×6 IMPLANT
POUCH ENDO CATCH II 15MM (MISCELLANEOUS) IMPLANT
POUCH SPECIMEN RETRIEVAL 10MM (ENDOMECHANICALS) IMPLANT
SEALANT PROGEL (MISCELLANEOUS) IMPLANT
SEALANT SURG COSEAL 4ML (VASCULAR PRODUCTS) IMPLANT
SEALANT SURG COSEAL 8ML (VASCULAR PRODUCTS) IMPLANT
SET DRAINAGE LINE (MISCELLANEOUS) ×2 IMPLANT
SOLUTION ANTI FOG 6CC (MISCELLANEOUS) ×3 IMPLANT
SPECIMEN JAR MEDIUM (MISCELLANEOUS) ×3 IMPLANT
SPONGE GAUZE 4X4 12PLY STER LF (GAUZE/BANDAGES/DRESSINGS) ×2 IMPLANT
SPONGE INTESTINAL PEANUT (DISPOSABLE) ×2 IMPLANT
SPONGE TONSIL 1 RF SGL (DISPOSABLE) ×3 IMPLANT
SUT ETHILON 3 0 FSL (SUTURE) ×5 IMPLANT
SUT PROLENE 4 0 RB 1 (SUTURE)
SUT PROLENE 4-0 RB1 .5 CRCL 36 (SUTURE) IMPLANT
SUT SILK  1 MH (SUTURE) ×4
SUT SILK 1 MH (SUTURE) ×2 IMPLANT
SUT SILK 2 0SH CR/8 30 (SUTURE) IMPLANT
SUT SILK 3 0SH CR/8 30 (SUTURE) IMPLANT
SUT VIC AB 1 CTX 36 (SUTURE) ×3
SUT VIC AB 1 CTX36XBRD ANBCTR (SUTURE) IMPLANT
SUT VIC AB 2-0 CTX 36 (SUTURE) ×2 IMPLANT
SUT VIC AB 2-0 UR6 27 (SUTURE) IMPLANT
SUT VIC AB 3-0 MH 27 (SUTURE) IMPLANT
SUT VIC AB 3-0 X1 27 (SUTURE) ×5 IMPLANT
SUT VICRYL 2 TP 1 (SUTURE) IMPLANT
SWAB COLLECTION DEVICE MRSA (MISCELLANEOUS) IMPLANT
SYSTEM SAHARA CHEST DRAIN ATS (WOUND CARE) ×3 IMPLANT
TAPE CLOTH 4X10 WHT NS (GAUZE/BANDAGES/DRESSINGS) ×3 IMPLANT
TAPE CLOTH SURG 4X10 WHT LF (GAUZE/BANDAGES/DRESSINGS) ×2 IMPLANT
TIP APPLICATOR SPRAY EXTEND 16 (VASCULAR PRODUCTS) IMPLANT
TOWEL OR 17X24 6PK STRL BLUE (TOWEL DISPOSABLE) ×3 IMPLANT
TOWEL OR 17X26 10 PK STRL BLUE (TOWEL DISPOSABLE) ×6 IMPLANT
TRAP SPECIMEN MUCOUS 40CC (MISCELLANEOUS) IMPLANT
TRAY FOLEY CATH 16FRSI W/METER (SET/KITS/TRAYS/PACK) ×3 IMPLANT
TROCAR XCEL BLADELESS 5X75MML (TROCAR) ×3 IMPLANT
TROCAR XCEL NON-BLD 5MMX100MML (ENDOMECHANICALS) IMPLANT
TUBE ANAEROBIC SPECIMEN COL (MISCELLANEOUS) IMPLANT
TUNNELER SHEATH ON-Q 11GX8 DSP (PAIN MANAGEMENT) IMPLANT
VALVE REPLACEMENT CAP (MISCELLANEOUS) IMPLANT
WATER STERILE IRR 1000ML POUR (IV SOLUTION) ×6 IMPLANT

## 2016-05-30 NOTE — Progress Notes (Signed)
Tomi Bamberger called back, will try to be here by 1230, if possible. Dr. Jillyn Hidden called and informed.

## 2016-05-30 NOTE — Anesthesia Postprocedure Evaluation (Signed)
Anesthesia Post Note  Patient: Lorrine Kin  Procedure(s) Performed: Procedure(s) (LRB): VIDEO ASSISTED THORACOSCOPY (Right) PLEURAL BIOPSY Right  (Right) INSERTION PLEURAL DRAINAGE CATHETER (Right)  Patient location during evaluation: PACU Anesthesia Type: General Level of consciousness: awake and alert Pain management: pain level controlled Vital Signs Assessment: post-procedure vital signs reviewed and stable Respiratory status: spontaneous breathing, nonlabored ventilation, respiratory function stable and patient connected to nasal cannula oxygen Cardiovascular status: blood pressure returned to baseline and stable Postop Assessment: no signs of nausea or vomiting Anesthetic complications: no    Last Vitals:  Filed Vitals:   05/30/16 1615 05/30/16 1617  BP:  111/53  Pulse: 80 79  Temp:    Resp: 22 23    Last Pain:  Filed Vitals:   05/30/16 1629  PainSc: 7                  Zenaida Deed

## 2016-05-30 NOTE — H&P (View-Only) (Signed)
PCP is Raelene Bott, MD Referring Provider is Nicholas Lose, MD  Chief Complaint  Patient presents with  . Lung Mass    CT CHEST 05/08/16.Marland KitchenMarland KitchenCXR 05/21/16  . Adenopathy    mediastinal  . Pleural Effusion    right...THORACENTESIS 05/10/16 WITH 1.9 L    HPI: 82 year old woman with a history of breast cancer sent for consultation regarding a right pleural effusion.  Traci [name] is a 66-year-old woman with a history of right breast DCIS, paroxysmal atrial fibrillation, type 2 diabetes, obesity, obstructive sleep apnea, chronic diastolic congestive heart failure, remote history of DVT, biventricular pacemaker, and COPD. She is on warfarin for her atrial fibrillation.  She presented in late May with shortness of breath for 2 weeks. Workup revealed a large right pleural effusion. She had a thoracentesis and 1.9 L of fluid was drained. Cytology showed atypical cells that were TTF-1 positive and faintly positive for MOC 31. She improved symptomatically for a few days after drainage, but her symptoms rapidly recurred.  Her CT of the chest also showed a lung nodule and markedly enlarged mediastinal lymph nodes.  She currently is short of breath with even minimal activity. She is on home oxygen. She has noticed some swelling in her legs. She is not having any chest pain.  Zubrod Score: At the time of surgery this patient's most appropriate activity status/level should be described as: '[]'$     0    Normal activity, no symptoms '[]'$     1    Restricted in physical strenuous activity but ambulatory, able to do out light work '[]'$     2    Ambulatory and capable of self care, unable to do work activities, up and about >50 % of waking hours                              '[x]'$     3    Only limited self care, in bed greater than 50% of waking hours '[]'$     4    Completely disabled, no self care, confined to bed or chair '[]'$     5    Moribund    Past Medical History  Diagnosis Date  . DOE (dyspnea on exertion)   . COPD  (chronic obstructive pulmonary disease) ([place])   . Anxiety   . CHF (congestive heart failure) ([place])   . Diverticulitis of colon   . Paroxysmal atrial fibrillation ([facility])   . HTN (hypertension)   . Obesity   . Hyperlipidemia   . Full dentures   . Wears glasses   . GERD (gastroesophageal reflux disease)   . History of DVT (deep vein thrombosis) 1960s    BLE  . Presence of permanent cardiac pacemaker   . Hypothyroidism   . Hypokalemia 03/05/2016  . Sick sinus syndrome ([place]) 03/05/16    MDT PPM gen change Dr. Lovena Le  . Pleural effusion, right 05/08/2016    w/lymphadenopathy   . Cancer of right breast ([place])   . Kidney stones     "passed it"  . Type II diabetes mellitus ([place])   . Pneumonia 1990s  . OSA (obstructive sleep apnea)     has a cpap-cannot use (05/08/2016)  . Arthritis     "back, neck, toes" (05/08/2016)  . Chronic cervical pain   . Chronic lower back pain   . History of gout   . Depression     Past Surgical History  Procedure Laterality Date  .  Laminotomy / excision disk posterior cervical spine  1992  . Posterior laminectomy / decompression lumbar spine  1980; 1981    L4-5  . Esophagogastroduodenoscopy    . Dilation and curettage of uterus    . Colonoscopy    . Breast lumpectomy with radioactive seed localization Right 02/08/2015    Procedure: BREAST LUMPECTOMY WITH RADIOACTIVE SEED LOCALIZATION;  Surgeon: Stark Klein, MD;  Location: [facility];  Service: General;  Laterality: Right;  . Re-excision of breast lumpectomy Right 02/14/2015    Procedure: RE-EXCISION OF RIGHT BREAST LUMPECTOMY;  Surgeon: Stark Klein, MD;  Location: [facility];  Service: General;  Laterality: Right;  . Back surgery    . Ep implantable device N/A 03/05/2016    MDT PPM gen change  . Insert / replace / remove pacemaker  2007  . Laparoscopic cholecystectomy    . Breast biopsy Right   . Vaginal hysterectomy    . Cardiac catheterization  2005    Family History   Problem Relation Age of Onset  . Heart failure Mother   . Pancreatic cancer Father 58  . Vaginal cancer Sister 13  . Hodgkin's lymphoma Sister 40  . Lung cancer Sister 30    Social History Social History  Substance Use Topics  . Smoking status: Former Smoker -- 1.00 packs/day for 50 years    Types: Cigarettes    Quit date: 12/10/1996  . Smokeless tobacco: Never Used  . Alcohol Use: No    Current Outpatient Prescriptions  Medication Sig Dispense Refill  . allopurinol (ZYLOPRIM) 300 MG tablet Take 300 mg by mouth daily.    Marland Kitchen alum hydroxide-mag trisilicate (GAVISCON) 94-85 MG CHEW chewable tablet Chew 1 tablet by mouth 4 (four) times daily as needed for indigestion or heartburn.    Marland Kitchen amiodarone (PACERONE) 200 MG tablet Take 1 tablet (200 mg total) by mouth daily. 30 tablet 4  . amoxicillin-clavulanate (AUGMENTIN) 875-125 MG tablet Take 1 tablet by mouth every 12 (twelve) hours. 10 tablet 0  . anastrozole (ARIMIDEX) 1 MG tablet Take 1 tablet (1 mg total) by mouth daily. 90 tablet 3  . bismuth subsalicylate (PEPTO BISMOL) 262 MG/15ML suspension Take 30 mLs by mouth every 6 (six) hours as needed.    . diltiazem (CARDIZEM CD) 180 MG 24 hr capsule TAKE ONE CAPSULE BY MOUTH ONCE DAILY    . diphenhydramine-acetaminophen (TYLENOL PM) 25-500 MG TABS tablet Take 1 tablet by mouth at bedtime as needed.    . furosemide (LASIX) 80 MG tablet Take 80 mg by mouth daily.    Marland Kitchen HYDROcodone-acetaminophen (NORCO) 10-325 MG per tablet Take 1 tablet by mouth every 6 (six) hours as needed for severe pain. 20 tablet 0  . levothyroxine (SYNTHROID, LEVOTHROID) 25 MCG tablet Take 25 mcg by mouth daily before breakfast.   5  . metoprolol (TOPROL-XL) 100 MG 24 hr tablet Take 100 mg by mouth daily.      . nitroGLYCERIN (NITROSTAT) 0.4 MG SL tablet Place 0.4 mg under the tongue every 5 (five) minutes as needed for chest pain (MAX 3 TABLETS).     Marland Kitchen omeprazole (PRILOSEC) 40 MG capsule Take 40 mg by mouth daily.    .  Potassium Chloride ER 20 MEQ TBCR Take 40 mEq by mouth 2 (two) times daily. 120 tablet 0  . pravastatin (PRAVACHOL) 20 MG tablet Take 20 mg by mouth daily.   3  . warfarin (COUMADIN) 5 MG tablet Take 2.5-5 mg by mouth daily at  6 PM. TAKES '5MG'$  ON WED, THURS AND FRI ONLY TAKES 2.'5MG'$  ALL OTHER DAYS     No current facility-administered medications for this visit.    Allergies  Allergen Reactions  . Penicillins Swelling and Other (See Comments)    "Felt like brain was swelling out of her head" - reaction was with PCN shot. Pt has tolerated cephalosporins outpatient.   . Codeine Other (See Comments)    Stomach issues  . Shellfish Allergy Hives and Itching  . Sulfonamide Derivatives Other (See Comments)    Immune to this    Review of Systems  Constitutional: Positive for activity change and appetite change. Negative for fever, chills and unexpected weight change.  HENT: Positive for hearing loss and trouble swallowing. Negative for voice change.   Respiratory: Positive for cough, shortness of breath and wheezing.   Cardiovascular: Positive for leg swelling. Negative for chest pain.  Gastrointestinal: Positive for abdominal pain (Reflux) and constipation. Negative for blood in stool.  Genitourinary: Positive for frequency. Negative for dysuria and hematuria.  Musculoskeletal: Positive for back pain and joint swelling.  Neurological: Positive for light-headedness, numbness (feet) and headaches. Negative for seizures and syncope.  Hematological: Negative for adenopathy. Bruises/bleeds easily (On warfarin).  Psychiatric/Behavioral: The patient is nervous/anxious.   All other systems reviewed and are negative.   BP 114/64 mmHg  Pulse 79  Resp 20  Ht 5' 5.75" (1.67 m)  Wt 234 lb (106.142 kg)  BMI 38.06 kg/m2  SpO2 95% Physical Exam  Constitutional: She is oriented to person, place, and time. No distress.  Morbidly obese woman in wheelchair  HENT:  Head: Normocephalic and atraumatic.   Eyes: Conjunctivae and EOM are normal. Pupils are equal, round, and reactive to light. No scleral icterus.  Neck: Neck supple. No JVD present.  Cardiovascular: Normal rate and regular rhythm.   Murmur heard. Pulmonary/Chest: She has wheezes.  Absent breath sounds right base  Abdominal: Soft. She exhibits no distension. There is no tenderness.  Musculoskeletal: She exhibits edema (2+ bilaterally).  Lymphadenopathy:    She has no cervical adenopathy.  Neurological: She is alert and oriented to person, place, and time. No cranial nerve deficit.  Motor grossly intact  Skin: Skin is warm and dry.  Vitals reviewed.    Diagnostic Tests:  CT CHEST WITH CONTRAST  TECHNIQUE: Multidetector CT imaging of the chest was performed during intravenous contrast administration.  CONTRAST: 9m ISOVUE-300 IOPAMIDOL (ISOVUE-300) INJECTION 61%  COMPARISON: Chest CT dated 05/03/2016.  FINDINGS: Mediastinum/Lymph Nodes: Again noted is the multiple large lymph nodes within the mediastinum. Largest lymph node is again seen within the right lower peritracheal space measuring 3.3 cm short axis dimension. Additional confluent lymphadenopathy is again seen within the sub- carinal space measuring approximately 3 cm short axis dimension. An additional abnormal lymph node in the prevascular space measures 1.3 cm short axis dimension. Several other smaller lymph nodes are noted.  There is an irregular mass along the right lateral margin of the upper mediastinum, measuring 2.2 x 1.5 cm (series 201, image 16) which could be either a mediastinal mass or parenchymal mass.  Heart size is normal. Trace pericardial effusion again appreciated. Scattered atherosclerotic changes are seen along the walls of the normal-caliber thoracic aorta. No aortic aneurysm or dissection.  Both thyroid lobes appear enlarged and slightly heterogeneous. A 1.2 cm nodule in the left thyroid lobe was better seen on the  earlier chest CT.  Lungs/Pleura: The moderate-to-large right pleural effusion appears stable in size and extent,  at least partially loculated, with adjacent compressive atelectasis which also appears stable in extent. No new lung findings. Milder atelectasis noted at the left lung base. Small emphysematous blebs noted at each lung apex.  Upper abdomen: Limited images of the upper abdomen are unremarkable. Again noted is a left renal cyst, incompletely imaged. Again noted is the nodule within the left adrenal gland measuring 2.1 x 1.3 cm.  Musculoskeletal: Degenerative changes are seen throughout the thoracolumbar spine. No acute or suspicious osseous lesion. Superficial soft tissues are unremarkable.  IMPRESSION: 1. Right pleural effusion, moderate to large in size, stable compared to earlier chest CT of 05/03/2016. The pleural effusion again appears to be at least partially loculated. The adjacent compressive atelectasis is stable in extent. No evidence of pneumonia seen. 2. Milder atelectasis at the left lung base. Left lung otherwise clear. 3. Extensive mediastinal lymphadenopathy. As described on the earlier CT report, the large size of the mediastinal lymph nodes suggests metastatic disease or lymphoma. 4. Irregular nodule/mass bordering the right lateral margin of the upper mediastinum, measuring 2.2 x 1.5 cm, additional mediastinal mass versus neoplastic pulmonary mass. 5. Trace pericardial effusion, stable. Heart size is normal. 6. Heterogeneously enlarged thyroid. 7. Left adrenal mass, measuring 2.1 cm, adenoma versus metastasis.   Electronically Signed  By: Franki Cabot M.D.  On: 05/08/2016 18:57  I personally reviewed the CT chest and concur with the findings as noted above  Impression: 82 year old woman with a probable malignant right pleural effusion. 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 However, I suspect  this is a new primary lung cancer. It appears to be stage IV disease with a malignant pleural effusion. Her cytology was suspicious but not diagnostic of malignancy. I spoke with Dr. Lindi Adie who would like a biopsy with enough tissue for molecular testing to see if she would be a candidate for targeted therapy.  She has a symptomatic effusion. She did improve for a few days after the effusion was drained previously. I think she would respond well to a pleural catheter to keep the fluid drained.  Unfortunately she still taking her Coumadin. Her INR is was only 1.38 on 05/11/2016, but was therapeutic only 4 days before that. I'm not comfortable trying to do a thoracentesis on her in the office today. Unfortunately I will be out of town tomorrow. We will check an INR on her and if its less than 2 we'll see if we can get IR to do a temporizing thoracentesis prior to definitive treatment.  I recommended to Traci [name] and her family that we do a right VATS, pleural and/or lymph node biopsies, and pleural catheter placement for definitive diagnosis and palliative treatment. They understand the general nature of the procedure. This will be done in the operating room under general anesthesia. She will be in the hospital for a couple of days afterwards. I reviewed the indications, risks, benefits, and alternatives. She understands the risk include, but are not limited to death, MI, DVT, PE, bleeding, possible need for transfusion, stroke, infection, catheter malfunction, as well as the possibility of other unforeseeable complications.  I will check with Dr. Lovena Le to see if he has any concerns about her having general anesthesia. She does not have any history of coronary disease but does have a history of CHF and paroxysmal atrial fibrillation.  Hold Coumadin beginning today. She understands there is a risk of stroke in the interim however it is fairly small.  Plan:  Hold Coumadin  Check INR, if less than 2  we'll ask IR to reduce ultrasound-guided thoracentesis tomorrow  Right VATS, pleural or lymph node biopsies, pleural catheter placement on Wednesday, 05/30/2016.  Traci Nakayama, MD [facility] [phone]

## 2016-05-30 NOTE — Interval H&P Note (Signed)
History and Physical Interval Note:  05/30/2016 12:20 PM  Traci [name]  has presented today for surgery, with the diagnosis of RIGHT PLEURAL EFFUSION  The various methods of treatment have been discussed with the patient and family. After consideration of risks, benefits and other options for treatment, the patient has consented to  Procedure(s): VIDEO ASSISTED THORACOSCOPY (Right) PLEURAL BIOPSY (Right) INSERTION PLEURAL DRAINAGE CATHETER (Right) as a surgical intervention .  The patient's history has been reviewed, patient examined, no change in status, stable for surgery.  I have reviewed the patient's chart and labs.  Questions were answered to the patient's satisfaction.     Melrose Nakayama

## 2016-05-30 NOTE — Progress Notes (Signed)
No return call from [employer] rep, repaged at this time.

## 2016-05-30 NOTE — Progress Notes (Signed)
[employer] rep paged per Dr. Jillyn Hidden, awaiting call back.

## 2016-05-30 NOTE — Transfer of Care (Signed)
Immediate Anesthesia Transfer of Care Note  Patient: Traci [name]  Procedure(s) Performed: Procedure(s): VIDEO ASSISTED THORACOSCOPY (Right) PLEURAL BIOPSY Right  (Right) INSERTION PLEURAL DRAINAGE CATHETER (Right)  Patient Location: PACU  Anesthesia Type:General  Level of Consciousness: awake, alert  and oriented  Airway & Oxygen Therapy: Patient Spontanous Breathing and Patient connected to face mask oxygen  Post-op Assessment: Report given to RN, Post -op Vital signs reviewed and stable and Patient moving all extremities  Post vital signs: Reviewed and stable  Last Vitals:  Filed Vitals:   05/30/16 1055  BP: 131/47  Pulse: 96  Temp: 36.9 C  Resp: 20    Last Pain:  Filed Vitals:   05/30/16 1133  PainSc: 5       Patients Stated Pain Goal: 2 (75/64/33 2951)  Complications: No apparent anesthesia complications

## 2016-05-30 NOTE — Anesthesia Preprocedure Evaluation (Signed)
Anesthesia Evaluation  Patient identified by MRN, date of birth, ID band Patient awake    Reviewed: Allergy & Precautions, NPO status , Patient's Chart, lab work & pertinent test results  Airway Mallampati: I  TM Distance: >3 FB Neck ROM: Full    Dental  (+) Upper Dentures, Lower Dentures, Dental Advisory Given   Pulmonary sleep apnea (No use of CPAP even though recommended) , COPD, former smoker,    breath sounds clear to auscultation       Cardiovascular hypertension, Pt. on medications + pacemaker  Rhythm:Regular Rate:Normal     Neuro/Psych PSYCHIATRIC DISORDERS Anxiety Depression    GI/Hepatic GERD  Medicated and Controlled,  Endo/Other  diabetes, Well Controlled, Type 2Morbid obesity  Renal/GU Renal disease     Musculoskeletal  (+) Arthritis ,   Abdominal   Peds  Hematology   Anesthesia Other Findings Pacemaker reprogrammed preop to asynchronous at 82  Reproductive/Obstetrics                             Anesthesia Physical  Anesthesia Plan  ASA: III  Anesthesia Plan: General   Post-op Pain Management:    Induction: Intravenous  Airway Management Planned: Double Lumen EBT  Additional Equipment: Arterial line  Intra-op Plan:   Post-operative Plan: Possible Post-op intubation/ventilation  Informed Consent: I have reviewed the patients History and Physical, chart, labs and discussed the procedure including the risks, benefits and alternatives for the proposed anesthesia with the patient or authorized representative who has indicated his/her understanding and acceptance.   Dental advisory given  Plan Discussed with: CRNA, Anesthesiologist and Surgeon  Anesthesia Plan Comments:         Anesthesia Quick Evaluation

## 2016-05-30 NOTE — Anesthesia Procedure Notes (Signed)
Date/Time: 05/30/2016 1:50 PM Performed by: Trixie Deis A Pre-anesthesia Checklist: Patient identified, Emergency Drugs available, Suction available, Patient being monitored and Timeout performed Patient Re-evaluated:Patient Re-evaluated prior to inductionOxygen Delivery Method: Circle system utilized Preoxygenation: Pre-oxygenation with 100% oxygen Intubation Type: IV induction Ventilation: Mask ventilation without difficulty Laryngoscope Size: Mac and 3 Grade View: Grade I Endobronchial tube: Left, Double lumen EBT, EBT position confirmed by fiberoptic bronchoscope and EBT position confirmed by auscultation and 35 Fr Number of attempts: 1 Airway Equipment and Method: Fiberoptic brochoscope and Stylet Placement Confirmation: ETT inserted through vocal cords under direct vision,  positive ETCO2 and breath sounds checked- equal and bilateral Secured at: 29 cm Tube secured with: Tape Dental Injury: Teeth and Oropharynx as per pre-operative assessment

## 2016-05-30 NOTE — Brief Op Note (Addendum)
05/30/2016  3:00 PM  PATIENT:  Traci [name]  82 y.o. female  PRE-OPERATIVE DIAGNOSIS:  RECURRENT RIGHT PLEURAL EFFUSION  POST-OPERATIVE DIAGNOSIS:  MALIGNANT RIGHT PLEURAL EFFUSION   PROCEDURE:  Procedure(s): VIDEO ASSISTED THORACOSCOPY (Right) PLEURAL BIOPSY Right  (Right) INSERTION PLEURAL DRAINAGE CATHETER (Right)  SURGEON:  Surgeon(s) and Role:    * Mel[place] Nakayama, MD - Primary  PHYSICIAN ASSISTANT: WAYNE GOLD PA-C  ANESTHESIA:   general  EBL:  Total I/O In: -  Out: 250 [Urine:200; Blood:50]  BLOOD ADMINISTERED:none  DRAINS: 1 Chest Tube(s) in the RIGHT HEMITHORAX, ALSO PLEURX CATHETER   LOCAL MEDICATIONS USED:  NONE  SPECIMEN:  Source of Specimen:  PLEURAL BIOPSY, PLEURAL EFFUSION FOR CYTOLOGY  DISPOSITION OF SPECIMEN:  PATHOLOGY  COUNTS:  YES  PLAN OF CARE: Admit to inpatient   PATIENT DISPOSITION:  PACU - hemodynamically stable.   Delay start of Pharmacological VTE agent (>24hrs) due to surgical blood loss or risk of bleeding: no  2  Liters of non-clotting bloody fluid. Large tumor deposits on pleural surface.

## 2016-05-30 NOTE — Op Note (Signed)
NAMEMARITSA, Traci [name]                 ACCOUNT NO.:  [id number]  MEDICAL RECORD NO.:  [id number]  LOCATION:  3S11C                        FACILITY:  [facility]  PHYSICIAN:  Revonda Standard. Roxan Hockey, M.D.DATE OF BIRTH:  May 08, 1932  DATE OF PROCEDURE:  05/30/2016 DATE OF DISCHARGE:                              OPERATIVE REPORT   PREOPERATIVE DIAGNOSIS:  Recurrent right pleural effusion.  POSTOPERATIVE DIAGNOSIS:  Malignant right pleural effusion.  PROCEDURE:  Right video-assisted thoracoscopy, drainage of pleural effusion, pleural biopsy, insertion of pleural drainage catheter.  SURGEON:  Revonda Standard. Roxan Hockey, M.D.  ASSISTANT:  John Giovanni, P.A.-C.  ANESTHESIA:  General.  FINDINGS:  2 L of nonclotting bloody pleural fluid evacuated, extensive metastatic tumor deposits over the majority of the parietal pleural surface.  CLINICAL NOTE:  Traci [name] is an 82 year old woman, who recently presented with shortness of breath.  She was found to have a large right pleural effusion.  She had undergone thoracentesis on 2 occasions with rapid reaccumulation.  Cytology showed atypical cells on both occasions. She improved symptomatically transiently after each drainage, but within a few days her shortness of breath returned.  CT of the chest showed a lung nodule and markedly enlarged mediastinal lymph nodes.  She was advised to undergo right VATS for drainage of the pleural effusion, pleural biopsy, and pleural catheter placement for long-term management of the effusions.  The indications, risks, benefits, and alternatives were discussed in detail with the patient.  She understood and accepted the risks and agreed to proceed.  OPERATIVE NOTE:  Traci [name] was brought to the operating room on May 30, 2016.  She had induction of general anesthesia, and a double-lumen endotracheal tube was placed.  A Foley catheter was placed.  Sequential compressive devices were placed on the calves for DVT  prophylaxis.  She was placed in a left lateral decubitus position, and the right chest was prepped and draped in usual sterile fashion.  Single lung ventilation of the left lung was initiated and was tolerated well throughout the procedure.  An incision made in the seventh interspace in the midaxillary line, it was carried through the skin and subcutaneous tissue.  A hemostat was used to enter the pleural space.  A sucker was advanced into the pleura and 2 L of nonclotting bloody fluid was evacuated.  This was sent for cytology.  A scope was inserted and it was immediately obvious that there were extensive large metastatic tumor deposits over the majority of the parietal pleural surface.  A small working incision of 4 cm in length was made anterolaterally in the fifth interspace.  No rib spreading was performed during the procedure.  There were some minor fibrinous adhesions in the pleural space that were taken down.  An attempt was made to drain all pleural fluid.  Biopsies were taken of 2 large tumor implants on the parietal pleural surface.  There was some bleeding which was controlled with electrocautery.  The pleural catheter then was placed into the pleural space and tunneled to an exit site in the anterior abdominal wall.  A 28-French Blake drain was placed through the original port incision and secured with 0 silk suture.  The right lung was reinflated.  There was good reexpansion, with possibly some minor limitation of complete re-expansion of the lower lobe.  The small incision where the pleural catheter had been placed was closed with a 3- 0 Vicryl subcuticular suture.  The working incision was closed in 3 layers with a #1 Vicryl fascial suture, 2-0 Vicryl subcutaneous suture, and a 3-0 Vicryl subcuticular suture.  The chest tubes were placed to Pleur-evac drainage.  She was placed back in supine position.  She was extubated in the operating room and taken to the  postanesthetic care unit in good condition.     Revonda Standard Roxan Hockey, M.D.     SCH/MEDQ  D:  05/30/2016  T:  05/30/2016  Job:  [id number]

## 2016-05-31 ENCOUNTER — Other Ambulatory Visit: Payer: Self-pay | Admitting: Hematology and Oncology

## 2016-05-31 ENCOUNTER — Encounter (HOSPITAL_COMMUNITY): Payer: Self-pay | Admitting: Thoracic Surgery (Cardiothoracic Vascular Surgery)

## 2016-05-31 ENCOUNTER — Inpatient Hospital Stay (HOSPITAL_COMMUNITY): Payer: Medicare Other

## 2016-05-31 DIAGNOSIS — C50411 Malignant neoplasm of upper-outer quadrant of right female breast: Secondary | ICD-10-CM

## 2016-05-31 LAB — CBC
HCT: 27.5 % — ABNORMAL LOW (ref 36.0–46.0)
Hemoglobin: 8.3 g/dL — ABNORMAL LOW (ref 12.0–15.0)
MCH: 25.9 pg — AB (ref 26.0–34.0)
MCHC: 30.2 g/dL (ref 30.0–36.0)
MCV: 85.9 fL (ref 78.0–100.0)
PLATELETS: 384 10*3/uL (ref 150–400)
RBC: 3.2 MIL/uL — AB (ref 3.87–5.11)
RDW: 15.5 % (ref 11.5–15.5)
WBC: 25.9 10*3/uL — AB (ref 4.0–10.5)

## 2016-05-31 LAB — BLOOD GAS, ARTERIAL
Acid-Base Excess: 2.4 mmol/L — ABNORMAL HIGH (ref 0.0–2.0)
BICARBONATE: 26.7 meq/L — AB (ref 20.0–24.0)
Drawn by: 31101
O2 Content: 3 L/min
O2 Saturation: 97.4 %
PCO2 ART: 42.9 mmHg (ref 35.0–45.0)
PH ART: 7.41 (ref 7.350–7.450)
Patient temperature: 98.6
TCO2: 28 mmol/L (ref 0–100)
pO2, Arterial: 93.6 mmHg (ref 80.0–100.0)

## 2016-05-31 LAB — BASIC METABOLIC PANEL
Anion gap: 8 (ref 5–15)
BUN: 11 mg/dL (ref 6–20)
CHLORIDE: 97 mmol/L — AB (ref 101–111)
CO2: 27 mmol/L (ref 22–32)
CREATININE: 0.76 mg/dL (ref 0.44–1.00)
Calcium: 8.6 mg/dL — ABNORMAL LOW (ref 8.9–10.3)
GFR calc Af Amer: >60 mL/min (ref >60)
GFR calc non Af Amer: >60 mL/min (ref >60)
Glucose, Bld: 113 mg/dL — ABNORMAL HIGH (ref 65–99)
POTASSIUM: 4.4 mmol/L (ref 3.5–5.1)
Sodium: 132 mmol/L — ABNORMAL LOW (ref 135–145)

## 2016-05-31 MED ORDER — GUAIFENESIN ER 600 MG PO TB12
600.0000 mg | ORAL_TABLET | Freq: Two times a day (BID) | ORAL | Status: DC
  Administered 2016-05-31 – 2016-06-01 (×3): 600 mg via ORAL
  Filled 2016-05-31 (×3): qty 1

## 2016-05-31 MED ORDER — BENZONATATE 100 MG PO CAPS
100.0000 mg | ORAL_CAPSULE | Freq: Two times a day (BID) | ORAL | Status: DC | PRN
  Administered 2016-06-01: 100 mg via ORAL
  Filled 2016-05-31: qty 1

## 2016-05-31 NOTE — Progress Notes (Signed)
A- line was clotted, respiratory therapist removed it.

## 2016-05-31 NOTE — Discharge Instructions (Signed)
Thoracoscopy, Care After Refer to this sheet in the next few weeks. These instructions provide you with information about caring for yourself after your procedure. Your health care provider may also give you more specific instructions. Your treatment has been planned according to current medical practices, but problems sometimes occur. Call your health care provider if you have any problems or questions after your procedure. WHAT TO EXPECT AFTER THE PROCEDURE: After your procedure, it is common to feel sore for up to two weeks. HOME CARE INSTRUCTIONS  There are many different ways to close and cover an incision, including stitches (sutures), skin glue, and adhesive strips. Follow your health care provider's instructions about:  Incision care.  Bandage (dressing) changes and removal.  Incision closure removal.  Check your incision area every day for signs of infection. Watch for:  Redness, swelling, or pain.  Fluid, blood, or pus.  Take medicines only as directed by your health care provider.  Try to cough often. Coughing helps to protect against lung infection (pneumonia). It may hurt to cough. If this happens, hold a pillow against your chest when you cough.  Take deep breaths. This also helps to protect against pneumonia.  If you were given an incentive spirometer, use it as directed by your health care provider.  Do not take baths, swim, or use a hot tub until your health care provider approves. You may take showers.  Avoid lifting until your health care provider approves.  Avoid driving until your health care provider approves.  Do not travel by airplane after the chest tube is removed until your health care provider approves. SEEK MEDICAL CARE IF:  You have a fever.  Pain medicines do not ease your pain.  You have redness, swelling, or increasing pain in your incision area.  You develop a cough that does not go away, or you are coughing up mucus that is yellow or  green. SEEK IMMEDIATE MEDICAL CARE IF:  You have fluid, blood, or pus coming from your incision.  There is a bad smell coming from your incision or dressing.  You develop a rash.  You have difficulty breathing.  You cough up blood.  You develop light-headedness or you feel faint.  You develop chest pain.  Your heartbeat feels irregular or very fast.   This information is not intended to replace advice given to you by your health care provider. Make sure you discuss any questions you have with your health care provider.   Document Released: 06/15/2005 Document Revised: 12/17/2014 Document Reviewed: 08/11/2014 Elsevier Interactive Patient Education [employer].

## 2016-05-31 NOTE — Progress Notes (Addendum)
      [address]       [place] [address]             [phone]       1 Day Post-Op Procedure(s) (LRB): VIDEO ASSISTED THORACOSCOPY (Right) PLEURAL BIOPSY Right  (Right) INSERTION PLEURAL DRAINAGE CATHETER (Right)  Subjective: Patient states she is scheduled for a PET scan tomorrow afternoon at [facility]. She wants to speak to Dr. Roxan Hockey about this. She did not rest last night. No specific complaints this am.  Objective: Vital signs in last 24 hours: Temp:  [97.6 F (36.4 C)-98.9 F (37.2 C)] 98.2 F (36.8 C) (06/22 0739) Pulse Rate:  [42-96] 60 (06/22 0400) Cardiac Rhythm:  [-] A-V Sequential paced (06/22 0745) Resp:  [12-25] 14 (06/22 0739) BP: (46-154)/(28-68) 124/61 mmHg (06/22 0400) SpO2:  [83 %-100 %] 100 % (06/22 0821) Arterial Line BP: (106-132)/(40-86) 117/63 mmHg (06/22 0400) Weight:  [223 lb 5.2 oz (101.3 kg)-231 lb (104.781 kg)] 223 lb 5.2 oz (101.3 kg) (06/21 1750)     Intake/Output from previous day: 06/21 0701 - 06/22 0700 In: 1268.3 [P.O.:360; I.V.:908.3] Out: 1190 [Urine:650; Blood:50; Chest Tube:490]   Physical Exam:  Cardiovascular:Paced Pulmonary: Clear to auscultation on left and slightly diminished right base. Abdomen: Soft, non tender, bowel sounds present. Extremities: Mild bilateral lower extremity edema. Wounds: Clean and dry.  No erythema or signs of infection. Chest Tube: to suction, no air leak  Lab Results: CBC: Recent Labs  05/29/16 1507 05/31/16 0503  WBC 22.0* 25.9*  HGB 8.9* 8.3*  HCT 29.7* 27.5*  PLT 444* 384   BMET:  Recent Labs  05/29/16 1507 05/31/16 0503  NA 135 132*  K 4.8 4.4  CL 100* 97*  CO2 26 27  GLUCOSE 104* 113*  BUN 13 11  CREATININE 0.89 0.76  CALCIUM 9.0 8.6*    PT/INR:  Recent Labs  05/29/16 1507  LABPROT 17.5*  INR 1.42   ABG:  INR: Will add last result for INR, ABG once components are confirmed Will add last 4 CBG results once components are  confirmed  Assessment/Plan:  1. CV - Paced.History of a fib. On Amiodarone 200 mg daily, Cardizem CD 180 mg daily. Coumadin to be restarted this evening-daily INR. 2.  Pulmonary - On 3 liters of oxygen via [place]-was on oxygen prior to admission. Chest tube is to suction. There is no air leak. Chest tube with 490 cc of output. CXR this am shows no pneumothorax, trace right pleural effusion and atelectasis, cardiomegaly. Encourage incentive spirometer. Await pathology of right pleural fluid. 3. Anemia-H and H 8.3 and 27.5 4. History of CHF-on Lasix 80 mg daily 5. Decrease IVF  ZIMMERMAN,DONIELLE MPA-C 05/31/2016,8:48 AM Patient seen and examined, agree with above Dc chest tube Will keep overnight and drain pleur-x in am prior to dc She will need daily pleural catheter drainage once discharged Will see tomorrow if she feels up to doing the PET  Remo Lipps C. Roxan Hockey, MD [facility] [phone]

## 2016-05-31 NOTE — Discharge Summary (Signed)
Physician Discharge Summary       [address]       [place] [address]             [phone]    Patient ID: Traci [name] MRN: [id number] DOB/AGE: 80-Aug-1933 80 y.o.  Admit date: 05/30/2016 Discharge date: 06/01/2016  Admission Diagnoses:  Discharge Diagnoses:  Active Problems:   Metastatic cancer ([place])   Consults: None  Procedure (s):  Right video-assisted thoracoscopy, drainage of pleural effusion, pleural biopsy, insertion of pleural drainage catheter by Dr. Roxan Hockey on 05/30/2016.  Pathology:pending  History of Presenting Illness: Mrs. Muhlestein is an 82 year-old woman with a history of right breast DCIS, paroxysmal atrial fibrillation, type 2 diabetes, obesity, obstructive sleep apnea, chronic diastolic congestive heart failure, remote history of DVT, biventricular pacemaker, and COPD. She is on warfarin for her atrial fibrillation.  She presented in late May with shortness of breath for 2 weeks. Workup revealed a large right pleural effusion. She had a thoracentesis and 1.9 L of fluid was drained. Cytology showed atypical cells that were TTF-1 positive and faintly positive for MOC 31. She improved symptomatically for a few days after drainage, but her symptoms rapidly recurred.  Her CT of the chest also showed a lung nodule and markedly enlarged mediastinal lymph nodes.  She currently is short of breath with even minimal activity. She is on home oxygen. She has noticed some swelling in her legs. She is not having any chest pain. Mrs. Fincher has a probable malignant right pleural effusion. 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 However, I suspect this is a new primary lung cancer. It appears to be stage IV disease with a malignant pleural effusion. Her cytology was suspicious but not diagnostic of malignancy. Dr. Roxan Hockey spoke with Dr. Lindi Adie who would like a biopsy with enough tissue for molecular testing to  see if she would be a candidate for targeted therapy. Potential risks, benefits, and complications were discussed with the patient and she agreed to proceed with surgery. She underwent a right VATS, drain effusion, pleural biopsy, and Pleur X catheter on 05/30/2016.  Brief Hospital Course:  She remained afebrile an hemodynamically stable. A line and foley were removed early in her post operative course. Chest tube output gradually decreased and there was no air leak. Daily chest x rays were obtained and remained stable. Chest tube was removed. She is tolerating a diet and has had a bowel movement. She is on oxygen via [place]. Right pleur X catheter will be drained every other day and output is to be recorded. She is felt surgically stable for discharge. Plan is for PET scan at [facility] today   Latest Vital Signs: Blood pressure 105/70, pulse 65, temperature 98.8 F (37.1 C), temperature source Oral, resp. rate 19, height '5\' 5"'$  (1.651 m), weight 223 lb 5.2 oz (101.3 kg), SpO2 98 %.  Physical Exam: Cardiovascular:Paced Pulmonary: Clear to auscultation on left and slightly diminished right base. Abdomen: Soft, non tender, bowel sounds present. Extremities: Mild bilateral lower extremity edema. Wounds: Clean and dry. No erythema or signs of infection. Chest Tube: to suction, no air leak Discharge Condition:Stable and discharged to home  Recent laboratory studies:  Lab Results  Component Value Date   WBC 19.5* 06/01/2016   HGB 7.8* 06/01/2016   HCT 25.5* 06/01/2016   MCV 86.1 06/01/2016   PLT 363 06/01/2016   Lab Results  Component Value Date  NA 135 06/01/2016   K 4.0 06/01/2016   CL 100* 06/01/2016   CO2 29 06/01/2016   CREATININE 1.01* 06/01/2016   GLUCOSE 90 06/01/2016    Diagnostic Studies:   Ct Chest W Contrast  05/08/2016  CLINICAL DATA:  Increasing pneumonia, shortness of breath and productive cough. EXAM: CT CHEST WITH CONTRAST TECHNIQUE: Multidetector CT imaging of the chest was  performed during intravenous contrast administration. CONTRAST:  45m ISOVUE-300 IOPAMIDOL (ISOVUE-300) INJECTION 61% COMPARISON:  Chest CT dated 05/03/2016. FINDINGS: Mediastinum/Lymph Nodes: Again noted is the multiple large lymph nodes within the mediastinum. Largest lymph node is again seen within the right lower peritracheal space measuring 3.3 cm short axis dimension. Additional confluent lymphadenopathy is again seen within the sub- carinal space measuring approximately 3 cm short axis dimension. An additional abnormal lymph node in the prevascular space measures 1.3 cm short axis dimension. Several other smaller lymph nodes are noted. There is an irregular mass along the right lateral margin of the upper mediastinum, measuring 2.2 x 1.5 cm (series 201, image 16) which could be either a mediastinal mass or parenchymal mass. Heart size is normal. Trace pericardial effusion again appreciated. Scattered atherosclerotic changes are seen along the walls of the normal-caliber thoracic aorta. No aortic aneurysm or dissection. Both thyroid lobes appear enlarged and slightly heterogeneous. A 1.2 cm nodule in the left thyroid lobe was better seen on the earlier chest CT. Lungs/Pleura: The moderate-to-large right pleural effusion appears stable in size and extent, at least partially loculated, with adjacent compressive atelectasis which also appears stable in extent. No new lung findings. Milder atelectasis noted at the left lung base. Small emphysematous blebs noted at each lung apex. Upper abdomen: Limited images of the upper abdomen are unremarkable. Again noted is a left renal cyst, incompletely imaged. Again noted is the nodule within the left adrenal gland measuring 2.1 x 1.3 cm. Musculoskeletal: Degenerative changes are seen throughout the thoracolumbar spine. No acute or suspicious osseous lesion. Superficial soft tissues are unremarkable. IMPRESSION: 1. Right pleural effusion, moderate to large in size, stable  compared to earlier chest CT of 05/03/2016. The pleural effusion again appears to be at least partially loculated. The adjacent compressive atelectasis is stable in extent. No evidence of pneumonia seen. 2. Milder atelectasis at the left lung base. Left lung otherwise clear. 3. Extensive mediastinal lymphadenopathy. As described on the earlier CT report, the large size of the mediastinal lymph nodes suggests metastatic disease or lymphoma. 4. Irregular nodule/mass bordering the right lateral margin of the upper mediastinum, measuring 2.2 x 1.5 cm, additional mediastinal mass versus neoplastic pulmonary mass. 5. Trace pericardial effusion, stable.  Heart size is normal. 6. Heterogeneously enlarged thyroid. 7. Left adrenal mass, measuring 2.1 cm, adenoma versus metastasis. Electronically Signed   By: SFranki CabotM.D.   On: 05/08/2016 18:57   Dg Chest Port 1 View  05/31/2016  CLINICAL DATA:  History of metastatic breast carcinoma, followup EXAM: PORTABLE CHEST 1 VIEW COMPARISON:  Chest x-ray of 05/29/2016 and CT chest of 05/08/2016 FINDINGS: Much of the right pleural effusion has been evacuated. There is airspace disease at both lung bases right greater than left and pneumonia cannot be excluded. Cardiomegaly is stable. Some nodularity the right hilum appears vascular when compared to the CT. Permanent pacemaker remains. IMPRESSION: 1. Evacuation of much of the right pleural effusion. No pneumothorax. 2. Right chest tube is present. Electronically Signed   By: PIvar DrapeM.D.   On: 05/31/2016 08:05   UKoreaThoracentesis  Asp Pleural Space W/img Guide  05/25/2016  INDICATION: History of breast cancer. Lung nodule and mediastinal lymphadenopathy. On warfarin for atrial fibrillation. Large right pleural effusion. Request for diagnostic and therapeutic thoracentesis. EXAM: ULTRASOUND GUIDED RIGHT THORACENTESIS MEDICATIONS: 1% Lidocaine. COMPLICATIONS: None immediate. PROCEDURE: An ultrasound guided thoracentesis was  thoroughly discussed with the patient and questions answered. The benefits, risks, alternatives and complications were also discussed. The patient understands and wishes to proceed with the procedure. Written consent was obtained. Ultrasound was performed to localize and mark an adequate pocket of fluid in the right chest. The area was then prepped and draped in the normal sterile fashion. 1% Lidocaine was used for local anesthesia. Under ultrasound guidance a 6 Fr Safe-T-Centesis catheter was introduced. Thoracentesis was performed. The catheter was removed and a dressing applied. FINDINGS: A total of approximately 2 liters of bloody fluid was removed. Samples were sent to the laboratory as requested by the clinical team. IMPRESSION: Successful ultrasound guided right thoracentesis yielding 2 liters of bloody pleural fluid. Read by:  Gareth Eagle, PA-C Electronically Signed   By: Corrie Mckusick D.O.   On: 05/25/2016 10:32   US Thoracentesis Asp Pleural Space W/img Guide  05/11/2016  INDICATION: Shortness of breath, pneumonia. Mediastinal lymphadenopathy. Large right pleural effusion. Request diagnostic and therapeutic thoracentesis. EXAM: ULTRASOUND GUIDED RIGHT THORACENTESIS MEDICATIONS: None. COMPLICATIONS: None immediate. Postprocedural chest x-ray negative for pneumothorax. PROCEDURE: An ultrasound guided thoracentesis was thoroughly discussed with the patient and questions answered. The benefits, risks, alternatives and complications were also discussed. The patient understands and wishes to proceed with the procedure. Written consent was obtained. Ultrasound was performed to localize and mark an adequate pocket of fluid in the right chest. The area was then prepped and draped in the normal sterile fashion. 1% Lidocaine was used for local anesthesia. Under ultrasound guidance a Safe-T-Centesis catheter was introduced. Thoracentesis was performed. The catheter was removed and a dressing applied. FINDINGS: A  total of approximately 1.9 L of dark, bloody pleural fluid was removed. Samples were sent to the laboratory as requested by the clinical team. IMPRESSION: Successful ultrasound guided right thoracentesis yielding 1.9 L of pleural fluid. Read by: Ascencion Dike PA-C Electronically Signed   By: Markus Daft M.D.   On: 05/10/2016 13:27   Discharge Medications:   Medication List    TAKE these medications        acetaminophen 500 MG tablet  Commonly known as:  TYLENOL  Take 1,000 mg by mouth every 6 (six) hours as needed (pain).     allopurinol 300 MG tablet  Commonly known as:  ZYLOPRIM  Take 300 mg by mouth daily.     alum hydroxide-mag trisilicate 27-03 MG Chew chewable tablet  Commonly known as:  GAVISCON  Chew 1 tablet by mouth 4 (four) times daily as needed for indigestion or heartburn.     amiodarone 200 MG tablet  Commonly known as:  PACERONE  Take 1 tablet (200 mg total) by mouth daily.     anastrozole 1 MG tablet  Commonly known as:  ARIMIDEX  Take 1 tablet (1 mg total) by mouth daily.     bismuth subsalicylate 500 XF/81WE suspension  Commonly known as:  PEPTO BISMOL  Take 30 mLs by mouth every 6 (six) hours as needed for indigestion.     diltiazem 180 MG 24 hr capsule  Commonly known as:  CARDIZEM CD  Take 180 mg by mouth daily.     diphenhydramine-acetaminophen 25-500 MG Tabs tablet  Commonly  known as:  TYLENOL PM  Take 1 tablet by mouth at bedtime as needed (sleep).     doxazosin 2 MG tablet  Commonly known as:  CARDURA  Take 2 mg by mouth at bedtime.     furosemide 80 MG tablet  Commonly known as:  LASIX  Take 80 mg by mouth daily.     HYDROcodone-acetaminophen 10-325 MG tablet  Commonly known as:  NORCO  Take 1 tablet by mouth every 6 (six) hours as needed for severe pain.     levothyroxine 25 MCG tablet  Commonly known as:  SYNTHROID, LEVOTHROID  Take 25 mcg by mouth daily with supper.     metoprolol succinate 100 MG 24 hr tablet  Commonly known as:   TOPROL-XL  Take 100 mg by mouth daily.     nitroGLYCERIN 0.4 MG SL tablet  Commonly known as:  NITROSTAT  Place 0.4 mg under the tongue every 5 (five) minutes as needed for chest pain (MAX 3 TABLETS).     omeprazole 40 MG capsule  Commonly known as:  PRILOSEC  Take 40 mg by mouth daily.     OXYGEN  Inhale 2 L into the lungs continuous.     Potassium Chloride ER 20 MEQ Tbcr  Take 40 mEq by mouth 2 (two) times daily.     pravastatin 20 MG tablet  Commonly known as:  PRAVACHOL  Take 20 mg by mouth daily with supper.     warfarin 5 MG tablet  Commonly known as:  COUMADIN  Take 2.5-5 mg by mouth daily at 6 PM. Take 1 tablet (5 mg) by mouth Monday, Wednesday, Friday, take 1/2 tablet (2.5 mg) on Sunday, Tuesday, Thursday, Saturday        Follow Up Appointments: Follow-up Information    Follow up with Melrose Nakayama, MD On 06/19/2016.   Specialty:  Cardiothoracic Surgery   Why:  PA/LAT CXR to be taken (at [facility] which is in the same building as Dr. Leonarda Salon office) on 06/19/2016 at 1:30 pm;Appointment time is at 2:00 pm   Contact information:   [place] [address] [phone]       Follow up with Nurse On 06/08/2016.   Why:  Appointment is with nurse only for chest tube suture removal   Contact information:   [place] [address] [phone]      Signed: Macy Mis 06/01/2016, 8:52 AM

## 2016-06-01 ENCOUNTER — Inpatient Hospital Stay (HOSPITAL_COMMUNITY): Payer: Medicare Other

## 2016-06-01 ENCOUNTER — Ambulatory Visit (HOSPITAL_COMMUNITY): Payer: Medicare Other

## 2016-06-01 LAB — COMPREHENSIVE METABOLIC PANEL
ALBUMIN: 2.2 g/dL — AB (ref 3.5–5.0)
ALT: 19 U/L (ref 14–54)
AST: 26 U/L (ref 15–41)
Alkaline Phosphatase: 66 U/L (ref 38–126)
Anion gap: 6 (ref 5–15)
BUN: 10 mg/dL (ref 6–20)
CALCIUM: 8.3 mg/dL — AB (ref 8.9–10.3)
CHLORIDE: 100 mmol/L — AB (ref 101–111)
CO2: 29 mmol/L (ref 22–32)
CREATININE: 1.01 mg/dL — AB (ref 0.44–1.00)
GFR calc Af Amer: 58 mL/min — ABNORMAL LOW (ref >60)
GFR calc non Af Amer: 50 mL/min — ABNORMAL LOW (ref >60)
GLUCOSE: 90 mg/dL (ref 65–99)
Potassium: 4 mmol/L (ref 3.5–5.1)
Sodium: 135 mmol/L (ref 135–145)
Total Bilirubin: 0.6 mg/dL (ref 0.3–1.2)
Total Protein: 5.1 g/dL — ABNORMAL LOW (ref 6.5–8.1)

## 2016-06-01 LAB — CBC
HEMATOCRIT: 25.5 % — AB (ref 36.0–46.0)
Hemoglobin: 7.8 g/dL — ABNORMAL LOW (ref 12.0–15.0)
MCH: 26.4 pg (ref 26.0–34.0)
MCHC: 30.6 g/dL (ref 30.0–36.0)
MCV: 86.1 fL (ref 78.0–100.0)
PLATELETS: 363 10*3/uL (ref 150–400)
RBC: 2.96 MIL/uL — AB (ref 3.87–5.11)
RDW: 15.4 % (ref 11.5–15.5)
WBC: 19.5 10*3/uL — AB (ref 4.0–10.5)

## 2016-06-01 LAB — PROTIME-INR
INR: 1.52 — AB (ref 0.00–1.49)
Prothrombin Time: 18.4 seconds — ABNORMAL HIGH (ref 11.6–15.2)

## 2016-06-01 MED ORDER — WHITE PETROLATUM GEL
Status: AC
  Administered 2016-06-01: 12:00:00
  Filled 2016-06-01: qty 1

## 2016-06-01 MED ORDER — HYDROCODONE-ACETAMINOPHEN 10-325 MG PO TABS
1.0000 | ORAL_TABLET | Freq: Four times a day (QID) | ORAL | Status: AC | PRN
Start: 2016-06-01 — End: ?

## 2016-06-01 NOTE — Progress Notes (Signed)
Discharge instructions given to patient and family, all questions answered at this time.  Pt. VSS with no s/s of distress noted.  Patient received a case of pluerex drain containers.  Pt. Discharged with home oxygen son brought in patients tank.  Pt. Stable at discharge.

## 2016-06-01 NOTE — Care Management Important Message (Signed)
Important Message  Patient Details  Name: Traci [name] MRN: [id number] Date of Birth: Dec 20, 1931   Medicare Important Message Given:  Yes    Nathen May 06/01/2016, 10:37 AM

## 2016-06-01 NOTE — Progress Notes (Signed)
2 Days Post-Op Procedure(s) (LRB): VIDEO ASSISTED THORACOSCOPY (Right) PLEURAL BIOPSY Right  (Right) INSERTION PLEURAL DRAINAGE CATHETER (Right) Subjective: C/o back pain, not using pCA  Objective: Vital signs in last 24 hours: Temp:  [97.6 F (36.4 C)-98.7 F (37.1 C)] 98.6 F (37 C) (06/23 0353) Pulse Rate:  [57-80] 57 (06/23 0353) Cardiac Rhythm:  [-] A-V Sequential paced (06/22 2006) Resp:  [15-24] 18 (06/23 0353) BP: (89-122)/(39-73) 89/73 mmHg (06/23 0353) SpO2:  [94 %-100 %] 98 % (06/23 0748)  Hemodynamic parameters for last 24 hours:    Intake/Output from previous day: 06/22 0701 - 06/23 0700 In: 1308 [P.O.:720; I.V.:927] Out: 2650 [Urine:2650] Intake/Output this shift:    General appearance: alert, cooperative and no distress Neurologic: intact Heart: regular rate and rhythm Lungs: diminished breath sounds bibasilar  Lab Results:  Recent Labs  05/31/16 0503 06/01/16 0438  WBC 25.9* 19.5*  HGB 8.3* 7.8*  HCT 27.5* 25.5*  PLT 384 363   BMET:  Recent Labs  05/31/16 0503 06/01/16 0438  NA 132* 135  K 4.4 4.0  CL 97* 100*  CO2 27 29  GLUCOSE 113* 90  BUN 11 10  CREATININE 0.76 1.01*  CALCIUM 8.6* 8.3*    PT/INR:  Recent Labs  06/01/16 0438  LABPROT 18.4*  INR 1.52*   ABG    Component Value Date/Time   PHART 7.410 05/31/2016 0450   HCO3 26.7* 05/31/2016 0450   TCO2 28.0 05/31/2016 0450   O2SAT 97.4 05/31/2016 0450   CBG (last 3)   Recent Labs  05/30/16 1307 05/30/16 1519 05/30/16 1834  GLUCAP 99 110* 165*    Assessment/Plan: S/P Procedure(s) (LRB): VIDEO ASSISTED THORACOSCOPY (Right) PLEURAL BIOPSY Right  (Right) INSERTION PLEURAL DRAINAGE CATHETER (Right) -  She is doing well  Not using PCA- will dc  Drain pleural catheter this AM- if no issues will dc today  She has a PET at [facility] later this afternoon  Daily pleural catheter drainage at home  Back on Coumadin, INR up a little, was therapeutic on home dose  Follow  up with Dr. Lindi Adie on Monday as scheduled     LOS: 2 days    Melrose Nakayama 06/01/2016

## 2016-06-01 NOTE — Care Management Note (Addendum)
Case Management Note  Patient Details  Name: Traci [name] MRN: [id number] Date of Birth: 1932/03/28  Subjective/Objective:  Right VATS                  Action/Plan: Discharge Planning: AVS reviewed:  Omer Jack MD  NCM spoke with pt at bedside. Pt states she is active with [facility]. [place] # [phone], fax [phone]. HH RN will come out on tomorrow 06/02/2016 for resumption of care. Explained to [facility] agency that pt will need to have Pleurx Cath drained. Pt has oxygen and wheelchair at home. Family will bring portable tank for dc. NCM completed Rx for Pleurx Drains and faxed and mailed to [facility], [place] [facility]. Provided pt with copy of paperwork for Pleurx drains bottles.    7 NCM contacted Jadene Pierini PA to clarify how often Pleurx needs to be drained. DC summary states every other day and consult every day. Clarified every other day for pleurx drainage. [place] and with clarifications.    Expected Discharge Date:  06/01/16               Expected Discharge Plan:  [place]  In-House Referral:  NA  Discharge planning Services  CM Consult  Post Acute Care Choice:  Home Health, Resumption of Svcs/PTA Provider Choice offered to:  Patient  DME Arranged:  NA DME Agency: NA (has oxygen with [facility])  HH Arranged:  RN HH Agency:  [place] (active with [facility])  Status of Service:  Completed, signed off  If discussed at [place] of Stay Meetings, dates discussed:    Additional Comments:  Erenest Rasher, RN 06/01/2016, 11:47 AM

## 2016-06-04 ENCOUNTER — Ambulatory Visit: Payer: Medicare Other | Admitting: Hematology and Oncology

## 2016-06-06 ENCOUNTER — Encounter: Payer: Medicare Other | Admitting: Internal Medicine

## 2016-06-07 ENCOUNTER — Telehealth: Payer: Self-pay | Admitting: *Deleted

## 2016-06-07 ENCOUNTER — Encounter: Payer: Self-pay | Admitting: Hematology and Oncology

## 2016-06-07 ENCOUNTER — Ambulatory Visit (HOSPITAL_BASED_OUTPATIENT_CLINIC_OR_DEPARTMENT_OTHER): Payer: Medicare Other | Admitting: Hematology and Oncology

## 2016-06-07 ENCOUNTER — Encounter (HOSPITAL_COMMUNITY): Payer: Medicare Other

## 2016-06-07 VITALS — BP 86/64 | HR 81 | Temp 98.3°F | Resp 16 | Ht 65.0 in

## 2016-06-07 DIAGNOSIS — C3492 Malignant neoplasm of unspecified part of left bronchus or lung: Secondary | ICD-10-CM

## 2016-06-07 DIAGNOSIS — C782 Secondary malignant neoplasm of pleura: Secondary | ICD-10-CM

## 2016-06-07 DIAGNOSIS — C3490 Malignant neoplasm of unspecified part of unspecified bronchus or lung: Secondary | ICD-10-CM | POA: Diagnosis not present

## 2016-06-07 DIAGNOSIS — C50411 Malignant neoplasm of upper-outer quadrant of right female breast: Secondary | ICD-10-CM

## 2016-06-07 DIAGNOSIS — Z170 Estrogen receptor positive status [ER+]: Secondary | ICD-10-CM

## 2016-06-07 DIAGNOSIS — Z79811 Long term (current) use of aromatase inhibitors: Secondary | ICD-10-CM

## 2016-06-07 NOTE — Progress Notes (Signed)
Patient Care Team: Raelene Bott, MD as PCP - General (Internal Medicine) Nicholas Lose, MD as Consulting Physician (Hematology and Oncology) Melrose Nakayama, MD as Consulting Physician (Cardiothoracic Surgery) Evans Lance, MD as Consulting Physician (Cardiology)  DIAGNOSIS: No matching staging information was found for the patient.  SUMMARY OF ONCOLOGIC HISTORY:   Breast cancer of upper-outer quadrant of right female breast ([place])   01/18/2015 Mammogram Right breast: 8 mm mass at 8:00 position   01/18/2015 Initial Biopsy Right breast needle core biopsy: DCIS with calcifications, low-grade, ER 100%, PR 100%   02/08/2015 Surgery Right breast lumpectomy Barry Dienes): Grade 2 DCIS, spanning 1 cm. Positive inferior margin.    02/08/2015 Pathologic Stage pTis, pNx: Stage 0   02/14/2015 Surgery Right breat re-excision Barry Dienes): Microscopic focus of DCIS. Negative margins.     Miscellaneous Discussion at breast tumor board conference was that patient would not be a candidate for adjuvant RT given her advanced age.    02/2015 -  Anti-estrogen oral therapy Anastrazole daily (due to h/o blood clots). Planned duration of treatment: 5 years. Lindi Adie)   04/29/2015 Survivorship Survivorship Care Plan mailed to pt per her request. She declined in-person visit.     Metastatic primary lung cancer ([place])   05/30/2016 Initial Diagnosis Right pleural biopsy: Metastatic carcinoma consistent with primary lung adenocarcinoma.positive for cytokeratin 7, Napsin A, and TTF-1. There is focal staining for GCDFP, the significance of which is unknown.  negative for calretinin.    CHIEF COMPLIANT: Follow-up to discuss the results of pleural biopsy  INTERVAL HISTORY: Traci [name] is a 82 year old with above-mentioned history of malignant pleural effusion who underwent Pleurx catheter placement for malignant pleural effusion and a pleural biopsy. She is here to discuss the results of the biopsy with her family. Patient reports  that she has days of extreme weakness and fatigue she is unable to do a whole lot of activity at home. She is short of breath to minimal exertion. It appears that the PleuRx catheter is not draining a whole lot.  REVIEW OF SYSTEMS:   Constitutional: Denies fevers, chills or abnormal weight loss Eyes: Denies blurriness of vision Ears, nose, mouth, throat, and face: Denies mucositis or sore throat Respiratory: Shortness of breath cough Cardiovascular: Denies palpitation, chest discomfort Gastrointestinal:  Complains of constipation Skin: Denies abnormal skin rashes Lymphatics: Denies new lymphadenopathy or easy bruising Neurological: Generalized weakness Behavioral/Psych: depressive mood  Extremities: No lower extremity edema Breast:  denies any pain or lumps or nodules in either breasts All other systems were reviewed with the patient and are negative.  I have reviewed the past medical history, past surgical history, social history and family history with the patient and they are unchanged from previous note.  ALLERGIES:  is allergic to penicillins; codeine; shellfish allergy; and sulfonamide derivatives.  MEDICATIONS:  Current Outpatient Prescriptions  Medication Sig Dispense Refill  . acetaminophen (TYLENOL) 500 MG tablet Take 1,000 mg by mouth every 6 (six) hours as needed (pain).    Marland Kitchen allopurinol (ZYLOPRIM) 300 MG tablet Take 300 mg by mouth daily.    Marland Kitchen alum hydroxide-mag trisilicate (GAVISCON) 52-77 MG CHEW chewable tablet Chew 1 tablet by mouth 4 (four) times daily as needed for indigestion or heartburn.    Marland Kitchen amiodarone (PACERONE) 200 MG tablet Take 1 tablet (200 mg total) by mouth daily. 30 tablet 4  . anastrozole (ARIMIDEX) 1 MG tablet Take 1 tablet (1 mg total) by mouth daily. 90 tablet 3  . bismuth subsalicylate (PEPTO BISMOL)  262 MG/15ML suspension Take 30 mLs by mouth every 6 (six) hours as needed for indigestion.     Marland Kitchen diltiazem (CARDIZEM CD) 180 MG 24 hr capsule Take 180 mg  by mouth daily.     . diphenhydramine-acetaminophen (TYLENOL PM) 25-500 MG TABS tablet Take 1 tablet by mouth at bedtime as needed (sleep).     Marland Kitchen doxazosin (CARDURA) 2 MG tablet Take 2 mg by mouth at bedtime.  3  . furosemide (LASIX) 80 MG tablet Take 80 mg by mouth daily.    Marland Kitchen HYDROcodone-acetaminophen (NORCO) 10-325 MG tablet Take 1 tablet by mouth every 6 (six) hours as needed for severe pain. 20 tablet 0  . levothyroxine (SYNTHROID, LEVOTHROID) 25 MCG tablet Take 25 mcg by mouth daily with supper.   5  . metoprolol (TOPROL-XL) 100 MG 24 hr tablet Take 100 mg by mouth daily.      . nitroGLYCERIN (NITROSTAT) 0.4 MG SL tablet Place 0.4 mg under the tongue every 5 (five) minutes as needed for chest pain (MAX 3 TABLETS).     Marland Kitchen omeprazole (PRILOSEC) 40 MG capsule Take 40 mg by mouth daily.    . OXYGEN Inhale 2 L into the lungs continuous.    . Potassium Chloride ER 20 MEQ TBCR Take 40 mEq by mouth 2 (two) times daily. 120 tablet 0  . pravastatin (PRAVACHOL) 20 MG tablet Take 20 mg by mouth daily with supper.   3  . warfarin (COUMADIN) 5 MG tablet Take 2.5-5 mg by mouth daily at 6 PM. Take 1 tablet (5 mg) by mouth Monday, Wednesday, Friday, take 1/2 tablet (2.5 mg) on Sunday, Tuesday, Thursday, Saturday     No current facility-administered medications for this visit.    PHYSICAL EXAMINATION: ECOG PERFORMANCE STATUS: 3 - Symptomatic, >50% confined to bed  Filed Vitals:   06/07/16 1426  BP: 86/64  Pulse: 81  Temp: 98.3 F (36.8 C)  Resp: 16   Filed Weights    GENERAL:alert, no distress and comfortable SKIN: skin color, texture, turgor are normal, no rashes or significant lesions EYES: normal, Conjunctiva are pink and non-injected, sclera clear OROPHARYNX:no exudate, no erythema and lips, buccal mucosa, and tongue normal  NECK: supple, thyroid normal size, non-tender, without nodularity LYMPH:  no palpable lymphadenopathy in the cervical, axillary or inguinal LUNGS: Diminished breath  sounds at the bases HEART: regular rate & rhythm and no murmurs and no lower extremity edema ABDOMEN:abdomen soft, non-tender and normal bowel sounds MUSCULOSKELETAL:no cyanosis of digits and no clubbing  NEURO: alert & oriented x 3 with fluent speech, no focal motor/sensory deficits EXTREMITIES: No lower extremity edema  LABORATORY DATA:  I have reviewed the data as listed   Chemistry      Component Value Date/Time   NA 135 06/01/2016 0438   K 4.0 06/01/2016 0438   CL 100* 06/01/2016 0438   CO2 29 06/01/2016 0438   BUN 10 06/01/2016 0438   CREATININE 1.01* 06/01/2016 0438      Component Value Date/Time   CALCIUM 8.3* 06/01/2016 0438   ALKPHOS 66 06/01/2016 0438   AST 26 06/01/2016 0438   ALT 19 06/01/2016 0438   BILITOT 0.6 06/01/2016 0438       Lab Results  Component Value Date   WBC 19.5* 06/01/2016   HGB 7.8* 06/01/2016   HCT 25.5* 06/01/2016   MCV 86.1 06/01/2016   PLT 363 06/01/2016   NEUTROABS 11.7* 05/08/2016     ASSESSMENT & PLAN:  Metastatic primary lung cancer (  [place]) Right pleural biopsy: Metastatic carcinoma consistent with primary lung adenocarcinoma.positive for cytokeratin 7, Napsin A, and TTF-1. There is focal staining for GCDFP, the significance of which is unknown.  negative for calretinin.  Pathology review: I discussed the pathology report in great detail. Patient could not do the PET/CT scan because of fatigue. Molecular testing for the adenocarcinoma was considered but will not be done because patient is going on hospice Patient expressed her wishes that she does not want to be put on any specific treatment and she would like nature take its course.  Breast cancer of upper-outer quadrant of right female breast ([place]) Right breast DCIS diagnosed 01/18/2015 status post surgery 02/14/2015 Tis N0 stage 0 started anastrozole 02/27/2015 I discontinued anastrozole therapy  Rash on the back: I encouraged her to put cortisone cream. Severe constipation:  I recommended that she start taking MiraLAX.  Hospice will be consulted. They would be able to help the patient and her family. It appears that everyone is exhausted from helping take care of her.  No orders of the defined types were placed in this encounter.   The patient has a good understanding of the overall plan. she agrees with it. she will call with any problems that may develop before the next visit here.   Rulon Eisenmenger, MD 06/07/2016

## 2016-06-07 NOTE — Telephone Encounter (Signed)
Referral made to Hospice and [place].

## 2016-06-07 NOTE — Assessment & Plan Note (Signed)
Right breast DCIS diagnosed 01/18/2015 status post surgery 02/14/2015 Tis N0 stage 0 started anastrozole 02/27/2015 Continue with anastrozole therapy

## 2016-06-07 NOTE — Assessment & Plan Note (Signed)
Right pleural biopsy: Metastatic carcinoma consistent with primary lung adenocarcinoma.positive for cytokeratin 7, Napsin A, and TTF-1. There is focal staining for GCDFP, the significance of which is unknown.  negative for calretinin.  Pathology review: To discuss pathology report in great detail. Patient is scheduled for PET/CT scan for complete assessment. Molecular testing for the adenocarcinoma is ongoing. Patient is not a candidate for chemotherapy but could be a candidate for immunotherapy. We will need to find out the PDL 1 status.  Return to clinic in 2 weeks to discuss the molecular testing as well as treatment plan.

## 2016-06-08 ENCOUNTER — Other Ambulatory Visit: Payer: Self-pay | Admitting: *Deleted

## 2016-06-08 ENCOUNTER — Encounter: Payer: Self-pay | Admitting: *Deleted

## 2016-06-08 DIAGNOSIS — C50411 Malignant neoplasm of upper-outer quadrant of right female breast: Secondary | ICD-10-CM

## 2016-06-08 NOTE — Progress Notes (Signed)
Hospice referral to [facility] in [place].

## 2016-06-18 ENCOUNTER — Other Ambulatory Visit: Payer: Self-pay | Admitting: Thoracic Surgery (Cardiothoracic Vascular Surgery)

## 2016-06-18 ENCOUNTER — Encounter: Payer: Medicare Other | Admitting: *Deleted

## 2016-06-18 ENCOUNTER — Telehealth: Payer: Self-pay | Admitting: Cardiology

## 2016-06-18 DIAGNOSIS — J90 Pleural effusion, not elsewhere classified: Secondary | ICD-10-CM

## 2016-06-18 NOTE — Telephone Encounter (Signed)
Spoke with pt and reminded pt of remote transmission that is due today. Pt verbalized understanding.   

## 2016-06-19 ENCOUNTER — Ambulatory Visit
Admission: RE | Admit: 2016-06-19 | Discharge: 2016-06-19 | Disposition: A | Discharge status: Home / Self Care | Payer: Medicare Other | Source: Ambulatory Visit | Attending: Thoracic Surgery (Cardiothoracic Vascular Surgery) | Admitting: Thoracic Surgery (Cardiothoracic Vascular Surgery)

## 2016-06-19 ENCOUNTER — Ambulatory Visit (INDEPENDENT_AMBULATORY_CARE_PROVIDER_SITE_OTHER): Payer: Self-pay | Admitting: Thoracic Surgery (Cardiothoracic Vascular Surgery)

## 2016-06-19 ENCOUNTER — Encounter: Payer: Self-pay | Admitting: Thoracic Surgery (Cardiothoracic Vascular Surgery)

## 2016-06-19 VITALS — BP 113/67 | HR 112 | Resp 16 | Ht 65.0 in

## 2016-06-19 DIAGNOSIS — R918 Other nonspecific abnormal finding of lung field: Secondary | ICD-10-CM

## 2016-06-19 DIAGNOSIS — Z938 Other artificial opening status: Secondary | ICD-10-CM

## 2016-06-19 DIAGNOSIS — J90 Pleural effusion, not elsewhere classified: Secondary | ICD-10-CM

## 2016-06-19 DIAGNOSIS — J948 Other specified pleural conditions: Secondary | ICD-10-CM

## 2016-06-19 NOTE — Progress Notes (Signed)
[address]       [place] [address]             [phone]       HPI: Traci [name] returns today for follow-up regarding her right pleural catheter.  She is an 82 year old woman who presented with a rapidly recurring right pleural effusion. She had 2 thoracenteses done and in both cases the fluid came back rapidly. I did a right VATS on 05/30/2016. She had extensive pleural tumor deposits. Biopsies were consistent with non-small cell lung cancer. We drain the effusion and left in the pleural catheter. After discharge very quickly she was having minimal drainage from the catheter.  She saw Dr. Lindi Adie. She was not felt to be a candidate for chemotherapy. Molecular testing is pending. She has been referred to hospice.  She complains of pain from the catheter. It is especially painful when they try to drain it. She has not been drained for a week. When the nurse in the office connected the catheter to suction she complained of pain and there was no significant drainage.  Past Medical History  Diagnosis Date  . DOE (dyspnea on exertion)   . COPD (chronic obstructive pulmonary disease) ([place])   . Anxiety   . CHF (congestive heart failure) ([place])   . Diverticulitis of colon   . Paroxysmal atrial fibrillation ([facility])   . HTN (hypertension)   . Obesity   . Hyperlipidemia   . Full dentures   . Wears glasses   . GERD (gastroesophageal reflux disease)   . History of DVT (deep vein thrombosis) 1960s    BLE  . Presence of permanent cardiac pacemaker   . Hypothyroidism   . Hypokalemia 03/05/2016  . Sick sinus syndrome ([place]) 03/05/16    MDT PPM gen change Dr. Lovena Le  . Pleural effusion, right 05/08/2016    w/lymphadenopathy   . Cancer of right breast ([place])   . Kidney stones     "passed it"  . Type II diabetes mellitus ([place])   . Pneumonia 1990s  . OSA (obstructive sleep apnea)     has a cpap-cannot use (05/08/2016)  . Arthritis     "back, neck, toes" (05/08/2016)  .  Chronic cervical pain   . Chronic lower back pain   . History of gout   . Depression   . Wears glasses   . Early cataract       Current Outpatient Prescriptions  Medication Sig Dispense Refill  . acetaminophen (TYLENOL) 500 MG tablet Take 1,000 mg by mouth every 6 (six) hours as needed (pain).    Marland Kitchen allopurinol (ZYLOPRIM) 300 MG tablet Take 300 mg by mouth daily.    Marland Kitchen alum hydroxide-mag trisilicate (GAVISCON) 82-99 MG CHEW chewable tablet Chew 1 tablet by mouth 4 (four) times daily as needed for indigestion or heartburn.    Marland Kitchen amiodarone (PACERONE) 200 MG tablet Take 1 tablet (200 mg total) by mouth daily. 30 tablet 4  . anastrozole (ARIMIDEX) 1 MG tablet Take 1 tablet (1 mg total) by mouth daily. 90 tablet 3  . bismuth subsalicylate (PEPTO BISMOL) 262 MG/15ML suspension Take 30 mLs by mouth every 6 (six) hours as needed for indigestion.     Marland Kitchen diltiazem (CARDIZEM CD) 180 MG 24 hr capsule Take 180 mg by mouth daily.     . diphenhydramine-acetaminophen (TYLENOL PM) 25-500 MG TABS tablet Take 1 tablet by mouth at bedtime as needed (sleep).     Marland Kitchen doxazosin (  CARDURA) 2 MG tablet Take 2 mg by mouth at bedtime.  3  . furosemide (LASIX) 80 MG tablet Take 80 mg by mouth daily.    Marland Kitchen HYDROcodone-acetaminophen (NORCO) 10-325 MG tablet Take 1 tablet by mouth every 6 (six) hours as needed for severe pain. 20 tablet 0  . levothyroxine (SYNTHROID, LEVOTHROID) 25 MCG tablet Take 25 mcg by mouth daily with supper.   5  . metoprolol (TOPROL-XL) 100 MG 24 hr tablet Take 100 mg by mouth daily.      . nitroGLYCERIN (NITROSTAT) 0.4 MG SL tablet Place 0.4 mg under the tongue every 5 (five) minutes as needed for chest pain (MAX 3 TABLETS).     Marland Kitchen omeprazole (PRILOSEC) 40 MG capsule Take 40 mg by mouth daily.    . OXYGEN Inhale 2 L into the lungs continuous.    . Potassium Chloride ER 20 MEQ TBCR Take 40 mEq by mouth 2 (two) times daily. 120 tablet 0  . pravastatin (PRAVACHOL) 20 MG tablet Take 20 mg by mouth daily  with supper.   3  . warfarin (COUMADIN) 5 MG tablet Take 2.5-5 mg by mouth daily at 6 PM. Take 1 tablet (5 mg) by mouth Monday, Wednesday, Friday, take 1/2 tablet (2.5 mg) on Sunday, Tuesday, Thursday, Saturday     No current facility-administered medications for this visit.    Physical Exam BP 113/67 mmHg  Pulse 112  Resp 16  Ht '5\' 5"'$  (1.651 m)  SpO2 96% Morbidly obese 82 year old woman in no acute distress Ill-appearing Diminished but audible breath sounds right base Catheter site with some local skin breakdown, no sign of infection  Diagnostic Tests: CHEST 2 VIEW  COMPARISON: Portable chest x-ray of June 01, 2016  FINDINGS: There remains pleural fluid in the minor fissure. A small amount of pleural fluid is located on the low lower lateral thoracic wall on the right. There may be a small amount of pleural fluid at the right lung base. The interstitial markings of the right lung remain increased diffusely. Pleural thickening or fluid lie over the convexity of the rib right upper lobe. The right lower small caliber chest tube is not well visualized but its tip appears to lie medially in the lower right pleural space. Left lung is well-expanded. The interstitial markings are coarse but not as great is those on the right. The heart is top-normal in size. The pulmonary vascularity is prominent centrally. The permanent pacemaker defibrillator is in stable position. There is calcification in the wall of the aortic arch.  IMPRESSION: Persistent fluid in the minor fissure and inferolaterally in the lower right hemithorax. There has not been significant interval change since the previous study. Poor visualization of the small caliber right-sided chest tube.  Slight interval improvement in the pulmonary interstitium on the left. Stable central pulmonary vascular congestion.  Aortic atherosclerosis.   Electronically Signed  By: David Martinique M.D.  On: 06/19/2016  14:01 I personally reviewed the chest x-ray occur with findings as noted above.  Impression: Traci [name] is an 82 year old woman with a history of tobacco abuse who has metastatic adenocarcinoma of the lung. She had a malignant right pleural effusion. She has a pleural catheter in place but is had minimal drainage over the past 2 weeks. Her chest x-ray shows an excellent result with near complete resolution of the effusion. There is some fluid loculated in the minor fissure and there also small amount of fluid at the base, but overall this is a dramatic improvement  from her pre-op chest x-ray.  She will is not draining much. I offered her the option of pulling the clot catheter now or waiting a couple more weeks just to be sure the effusion doesn't recur before pulling the catheter. I recommended waiting a couple more weeks. She is willing to do that. She's having a lot of pain but are not sure resolved and the catheter some of the may be incisional and some of it may be tumor invasion of the chest wall. We will not try to drain the catheter in the interim, we'll see how her chest x-ray looks in 2 weeks.  Plan: Return in 2 weeks with PA and lateral chest x-ray  Melrose Nakayama, MD [facility] [phone]

## 2016-06-22 ENCOUNTER — Encounter: Payer: Self-pay | Admitting: Cardiology

## 2016-06-25 ENCOUNTER — Telehealth: Payer: Self-pay | Admitting: Hematology and Oncology

## 2016-06-25 ENCOUNTER — Encounter (HOSPITAL_COMMUNITY): Payer: Self-pay

## 2016-06-25 NOTE — Telephone Encounter (Signed)
Returned pt call and cancelled all appt per Hospice.

## 2016-06-25 NOTE — Telephone Encounter (Signed)
Attempted to return call to confirm appt cancelation

## 2016-06-29 ENCOUNTER — Ambulatory Visit (INDEPENDENT_AMBULATORY_CARE_PROVIDER_SITE_OTHER): Payer: Medicare Other | Admitting: *Deleted

## 2016-06-29 DIAGNOSIS — Z950 Presence of cardiac pacemaker: Secondary | ICD-10-CM

## 2016-06-29 DIAGNOSIS — I480 Paroxysmal atrial fibrillation: Secondary | ICD-10-CM

## 2016-07-02 ENCOUNTER — Other Ambulatory Visit: Payer: Self-pay | Admitting: Thoracic Surgery (Cardiothoracic Vascular Surgery)

## 2016-07-02 DIAGNOSIS — J90 Pleural effusion, not elsewhere classified: Secondary | ICD-10-CM

## 2016-07-03 ENCOUNTER — Ambulatory Visit: Payer: Self-pay | Admitting: Thoracic Surgery (Cardiothoracic Vascular Surgery)

## 2016-07-04 NOTE — Progress Notes (Signed)
Remote pacemaker transmission.   

## 2016-07-10 LAB — CUP PACEART REMOTE DEVICE CHECK
Battery Remaining Longevity: 156 mo
Brady Statistic AP VP Percent: 0 %
Brady Statistic AS VS Percent: 13 %
Implantable Lead Implant Date: 20070830
Implantable Lead Location: 753860
Lead Channel Pacing Threshold Amplitude: 1.75 V
Lead Channel Pacing Threshold Pulse Width: 0.4 ms
Lead Channel Sensing Intrinsic Amplitude: 8 mV
Lead Channel Setting Pacing Amplitude: 2 V
Lead Channel Setting Pacing Pulse Width: 0.4 ms
Lead Channel Setting Sensing Sensitivity: 2.8 mV
MDC IDC LEAD IMPLANT DT: 20070830
MDC IDC LEAD LOCATION: 753859
MDC IDC MSMT BATTERY IMPEDANCE: 100 Ohm
MDC IDC MSMT BATTERY VOLTAGE: 2.79 V
MDC IDC MSMT LEADCHNL RA IMPEDANCE VALUE: 430 Ohm
MDC IDC MSMT LEADCHNL RA PACING THRESHOLD AMPLITUDE: 1 V
MDC IDC MSMT LEADCHNL RA PACING THRESHOLD PULSEWIDTH: 0.4 ms
MDC IDC MSMT LEADCHNL RV IMPEDANCE VALUE: 1477 Ohm
MDC IDC SESS DTM: 20170721181643
MDC IDC SET LEADCHNL RV PACING AMPLITUDE: 3.5 V
MDC IDC STAT BRADY AP VS PERCENT: 87 %
MDC IDC STAT BRADY AS VP PERCENT: 0 %

## 2016-07-10 DEATH — deceased

## 2016-07-24 ENCOUNTER — Encounter: Payer: Self-pay | Admitting: Cardiology

## 2016-11-27 ENCOUNTER — Ambulatory Visit: Payer: Medicare Other | Admitting: Hematology and Oncology

## 2018-02-24 IMAGING — CT CT CHEST W/ CM
2 of 4 series · 11 of 36 positions shown, 13 images · IV contrast (Iodine)
Comparison: Chest CT dated 05/03/2016.

CLINICAL DATA: Increasing pneumonia, shortness of breath and
productive cough.

EXAM:
CT CHEST WITH CONTRAST
TECHNIQUE: Multidetector CT imaging of the chest was performed during
intravenous contrast administration.
CONTRAST:  75mL 9DLUKO-RJJ IOPAMIDOL (9DLUKO-RJJ) INJECTION 61%

[Series 201: chest with, idose (2) · axial · 0.79mm/px · z∈[+195,+438]mm · 8 of 119 slices shown, 10 images]
[im 11/119  mediastinal]
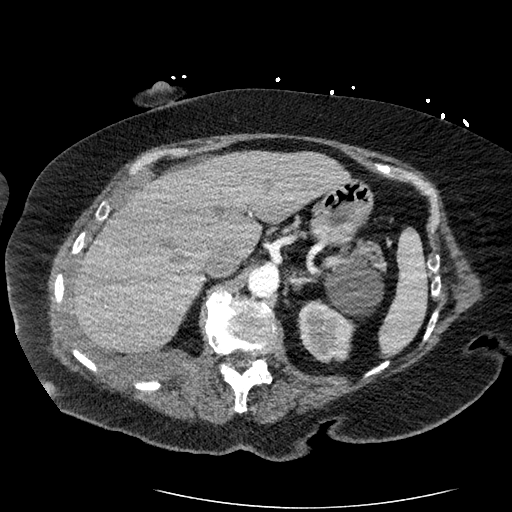
[im 11/119  lung]
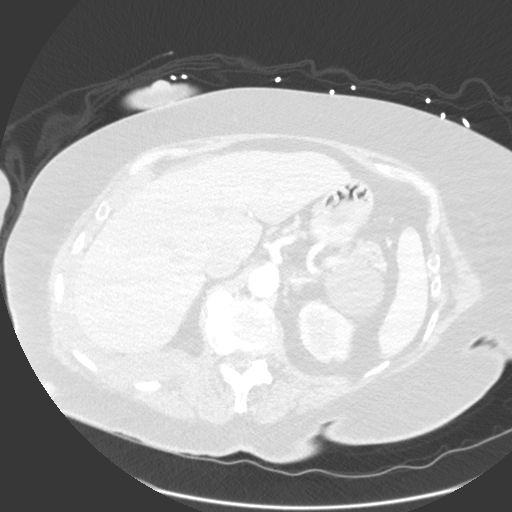
[im 26/119  lung]
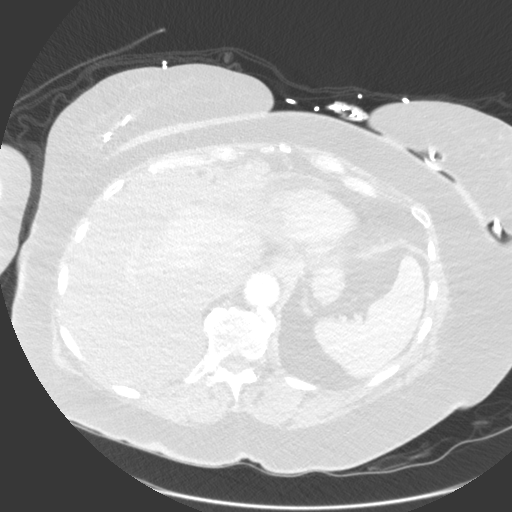
[im 36/119  lung]
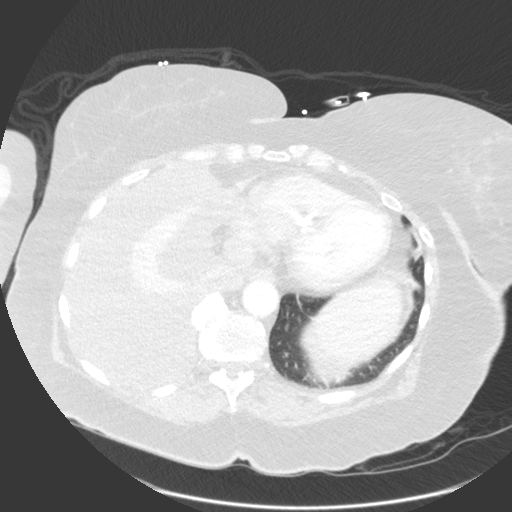
[im 52/119  lung]
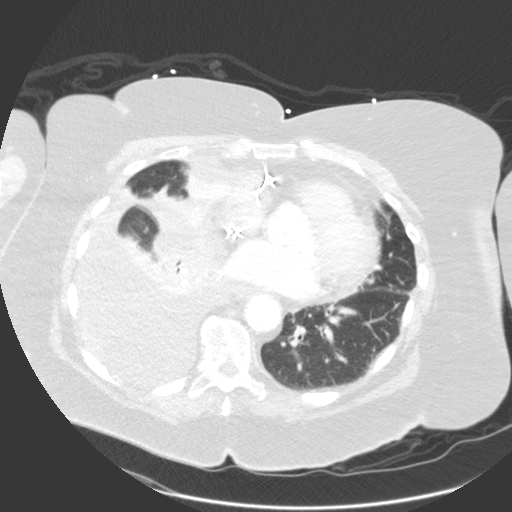
[im 67/119  mediastinal]
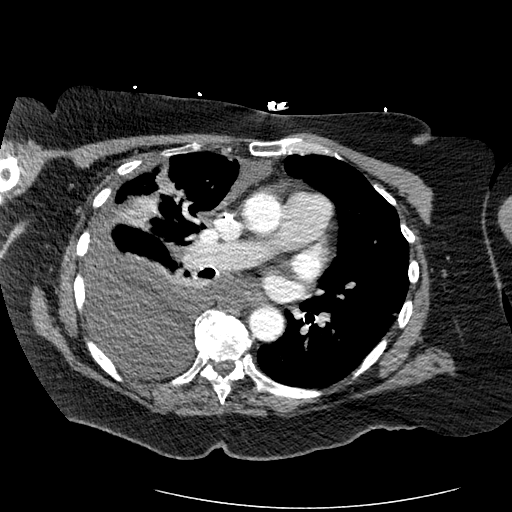
[im 67/119  lung]
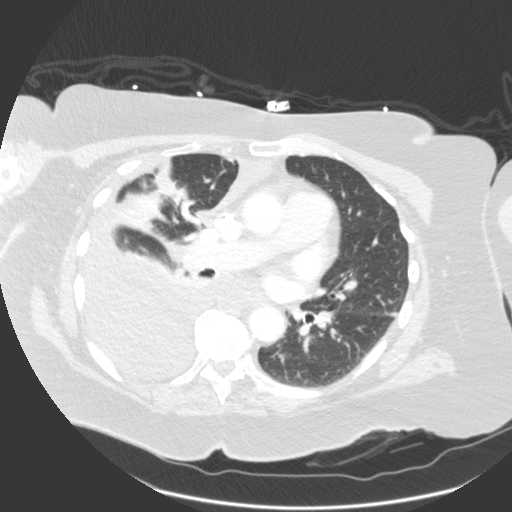
[im 83/119  lung]
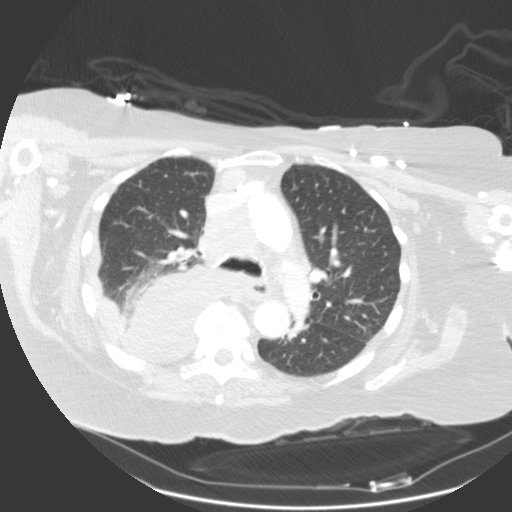
[im 93/119  lung]
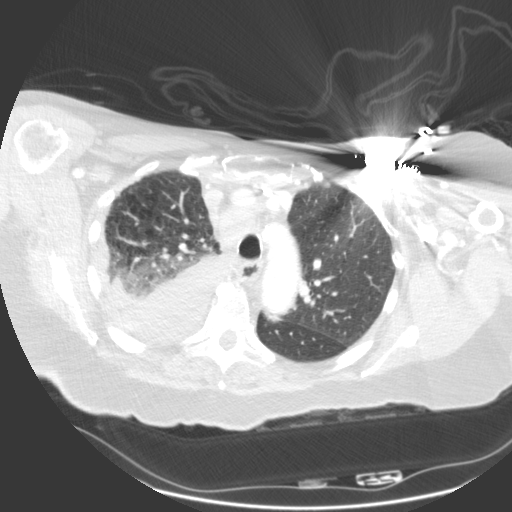
[im 108/119  lung]
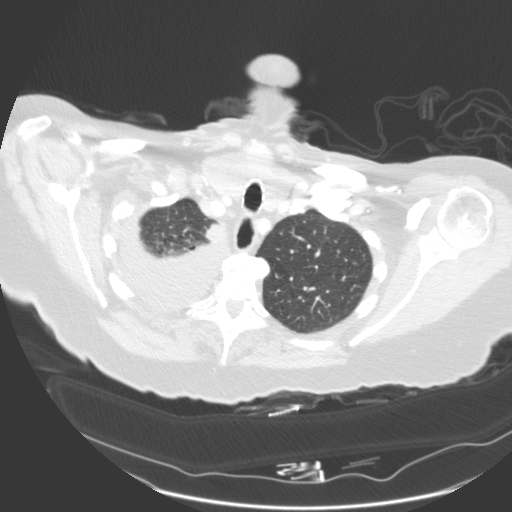

[Series 203: coronal, idose (2) · coronal · 0.45mm/px · 3 of 105 slices shown]
[im 21/105  lung]
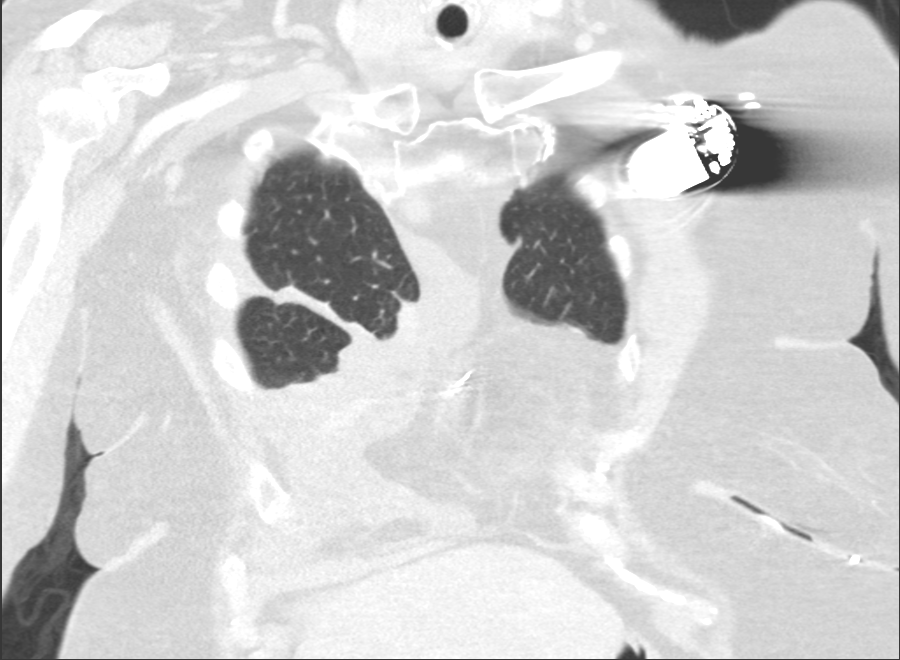
[im 42/105  lung]
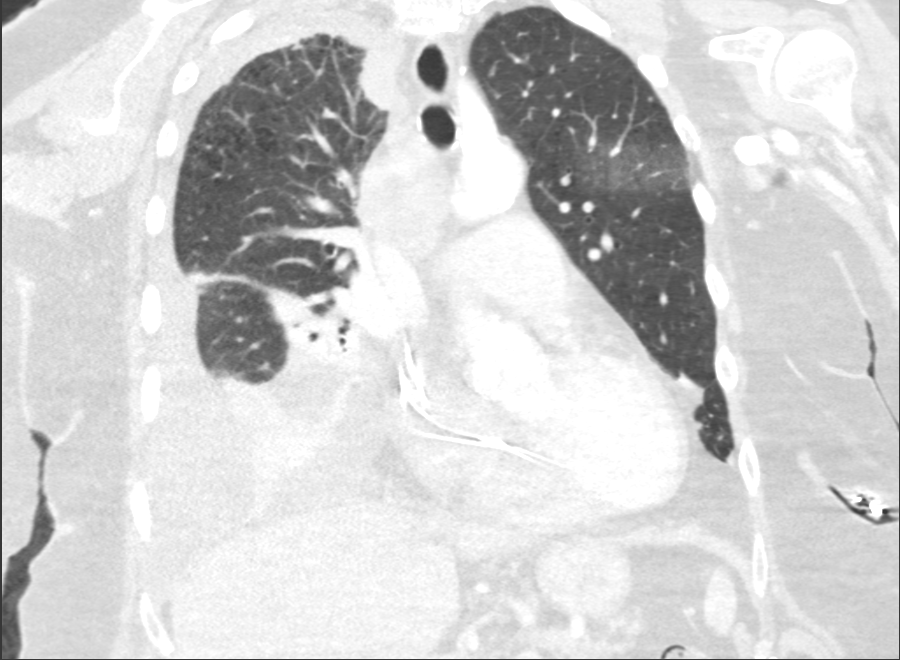
[im 63/105  lung]
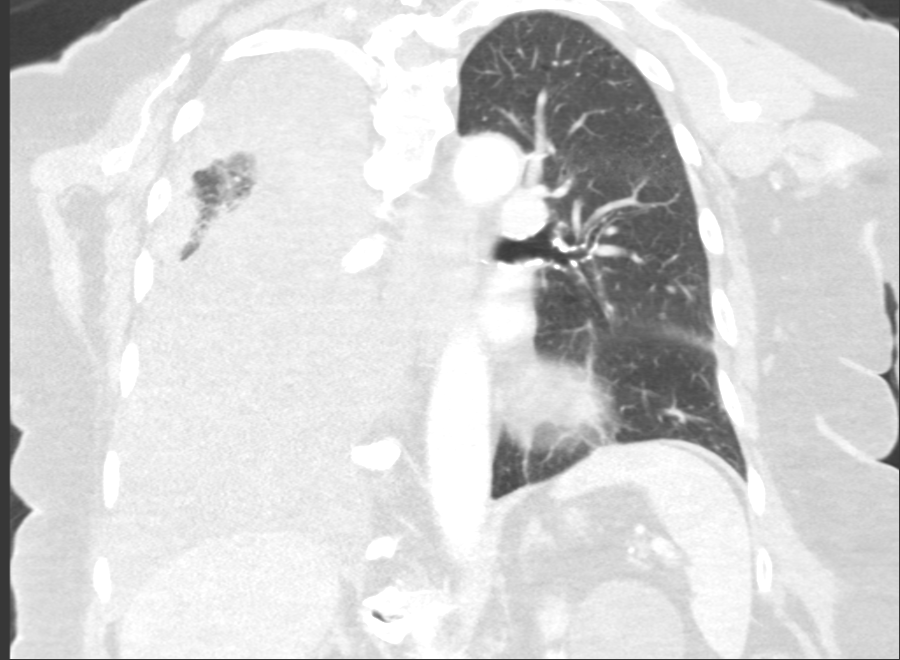

[11 of 36 positions shown; findings below may reference images not displayed]

FINDINGS: Mediastinum/Lymph Nodes: Again noted is the multiple large lymph
nodes within the mediastinum. Largest lymph node is again seen
within the right lower peritracheal space measuring 3.3 cm short
axis dimension. Additional confluent lymphadenopathy is again seen
within the sub- carinal space measuring approximately 3 cm short
axis dimension. An additional abnormal lymph node in the prevascular
space measures 1.3 cm short axis dimension. Several other smaller
lymph nodes are noted.

There is an irregular mass along the right lateral margin of the
upper mediastinum, measuring 2.2 x 1.5 cm (series 201, image 16)
which could be either a mediastinal mass or parenchymal mass.

Heart size is normal. Trace pericardial effusion again appreciated.
Scattered atherosclerotic changes are seen along the walls of the
normal-caliber thoracic aorta. No aortic aneurysm or dissection.

Both thyroid lobes appear enlarged and slightly heterogeneous. A
cm nodule in the left thyroid lobe was better seen on the earlier
chest CT.

Lungs/Pleura: The moderate-to-large right pleural effusion appears
stable in size and extent, at least partially loculated, with
adjacent compressive atelectasis which also appears stable in
extent. No new lung findings. Milder atelectasis noted at the left
lung base. Small emphysematous blebs noted at each lung apex.

Upper abdomen: Limited images of the upper abdomen are unremarkable.
Again noted is a left renal cyst, incompletely imaged. Again noted
is the nodule within the left adrenal gland measuring 2.1 x 1.3 cm.

Musculoskeletal: Degenerative changes are seen throughout the
thoracolumbar spine. No acute or suspicious osseous lesion.
Superficial soft tissues are unremarkable.
IMPRESSION: 1. Right pleural effusion, moderate to large in size, stable
compared to earlier chest CT of 05/03/2016. The pleural effusion
again appears to be at least partially loculated. The adjacent
compressive atelectasis is stable in extent. No evidence of
pneumonia seen.
2. Milder atelectasis at the left lung base. Left lung otherwise
clear.
3. Extensive mediastinal lymphadenopathy. As described on the
earlier CT report, the large size of the mediastinal lymph nodes
suggests metastatic disease or lymphoma.
4. Irregular nodule/mass bordering the right lateral margin of the
upper mediastinum, measuring 2.2 x 1.5 cm, additional mediastinal
mass versus neoplastic pulmonary mass.
5. Trace pericardial effusion, stable.  Heart size is normal.
6. Heterogeneously enlarged thyroid.
7. Left adrenal mass, measuring 2.1 cm, adenoma versus metastasis.

## 2018-03-13 IMAGING — CR DG CHEST 1V
1 series · 1 of 1 positions shown · non-contrast
Comparison: 05/21/2016

CLINICAL DATA: Status post thoracentesis on the right

EXAM:
CHEST 1 VIEW

[chest pa]
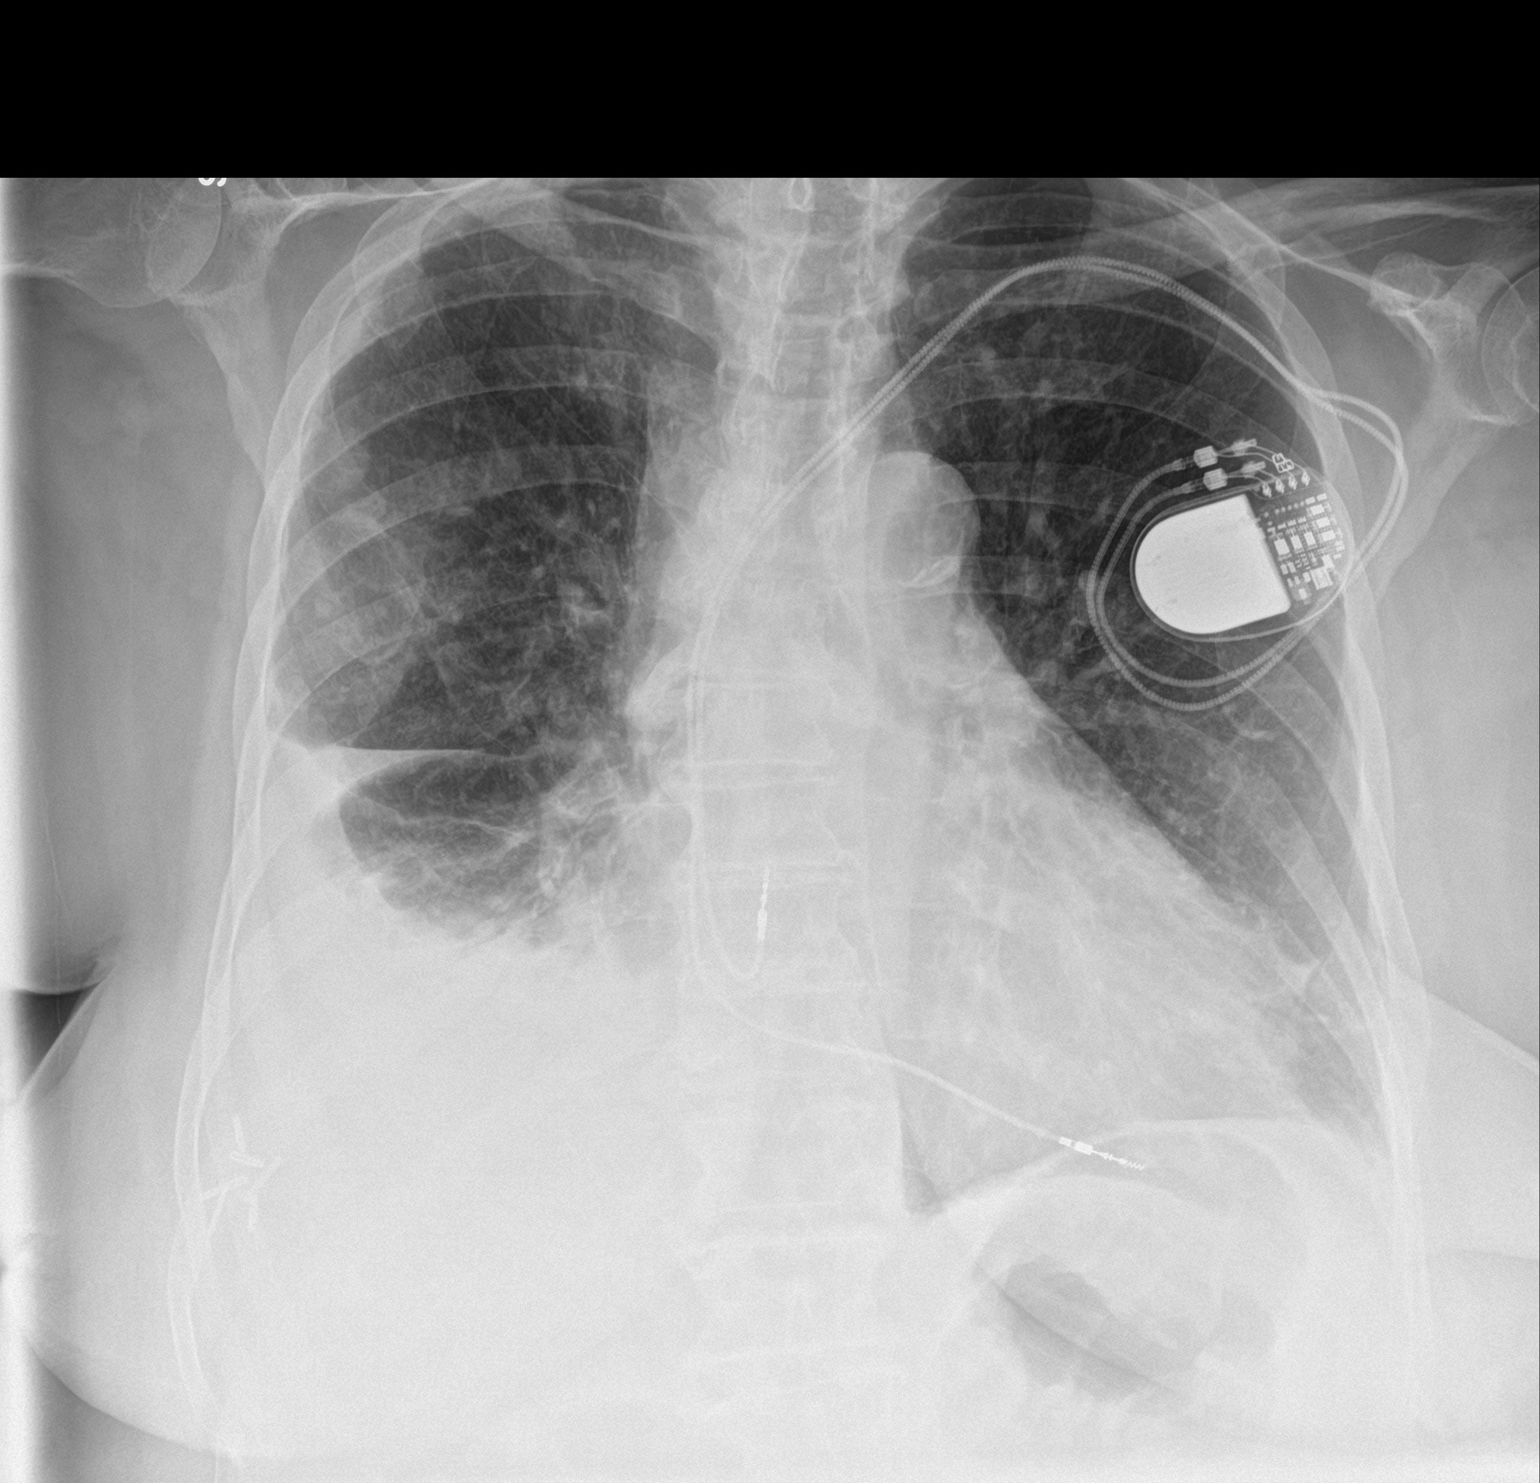

[1 of 1 positions shown; findings below may reference images not displayed]

FINDINGS: Significantly diminished right pleural effusion. No visible
pneumothorax or re-expansion edema. Much of the right basilar lung
is still obscured, with subtle air bronchograms. Some nodular
density over the right upper chest is pleural nodularity based on on
05/08/2016 chest CT. Stable clear left lung. Chronic cardiomegaly.
Stable dual-chamber pacer positioning.
IMPRESSION: 1. No acute finding after right thoracentesis. Residual fluid is
small to moderate. Right pleural nodularity is now seen at the apex;
probable malignant pleural effusion
2. Obscured right lower lobe.

## 2018-03-19 IMAGING — CR DG CHEST 1V PORT
1 series · 1 of 1 positions shown · non-contrast
Comparison: Chest x-ray of 05/29/2016 and CT chest of 05/08/2016

CLINICAL DATA: History of metastatic breast carcinoma, followup

EXAM:
PORTABLE CHEST 1 VIEW

[AP]
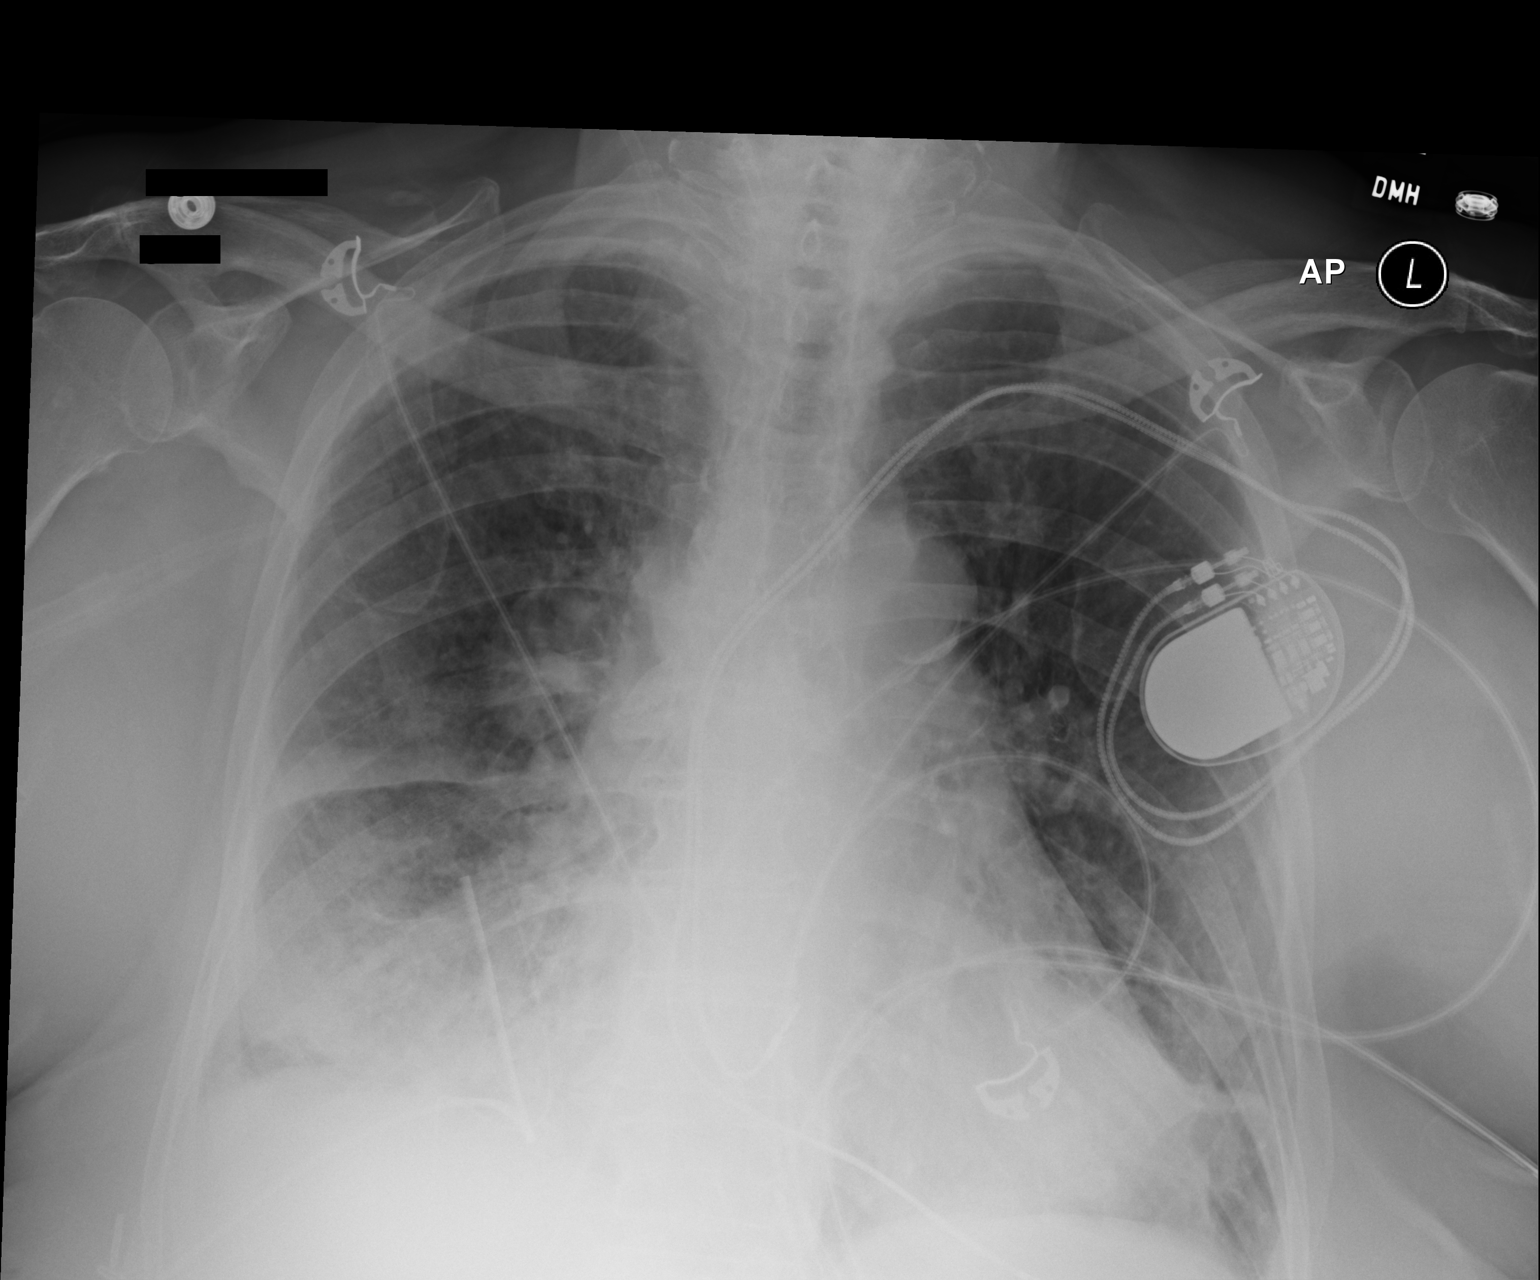

[1 of 1 positions shown; findings below may reference images not displayed]

FINDINGS: Much of the right pleural effusion has been evacuated. There is
airspace disease at both lung bases right greater than left and
pneumonia cannot be excluded. Cardiomegaly is stable. Some
nodularity the right hilum appears vascular when compared to the CT.
Permanent pacemaker remains.
IMPRESSION: 1. Evacuation of much of the right pleural effusion. No
pneumothorax.
2. Right chest tube is present.
